# Patient Record
Sex: Female | Born: 1945 | Race: White | Hispanic: No | Smoking: Former smoker
Health system: Southern US, Community
[De-identification: ages and names within clinical notes are randomized; demographics above are authoritative.]

## PROBLEM LIST (undated history)

## (undated) DIAGNOSIS — J301 Allergic rhinitis due to pollen: Secondary | ICD-10-CM

## (undated) DIAGNOSIS — N390 Urinary tract infection, site not specified: Secondary | ICD-10-CM

## (undated) DIAGNOSIS — I10 Essential (primary) hypertension: Secondary | ICD-10-CM

## (undated) DIAGNOSIS — B019 Varicella without complication: Secondary | ICD-10-CM

## (undated) DIAGNOSIS — E785 Hyperlipidemia, unspecified: Secondary | ICD-10-CM

## (undated) HISTORY — DX: Varicella without complication: B01.9

## (undated) HISTORY — DX: Essential (primary) hypertension: I10

## (undated) HISTORY — DX: Urinary tract infection, site not specified: N39.0

## (undated) HISTORY — DX: Hyperlipidemia, unspecified: E78.5

## (undated) HISTORY — DX: Allergic rhinitis due to pollen: J30.1

---

## 2017-11-13 ENCOUNTER — Encounter: Payer: Self-pay | Admitting: Family Medicine

## 2017-12-10 ENCOUNTER — Ambulatory Visit: Payer: Self-pay

## 2017-12-10 ENCOUNTER — Encounter (INDEPENDENT_AMBULATORY_CARE_PROVIDER_SITE_OTHER): Payer: Self-pay

## 2017-12-10 ENCOUNTER — Encounter: Payer: Self-pay | Admitting: Family Medicine

## 2017-12-10 ENCOUNTER — Ambulatory Visit (INDEPENDENT_AMBULATORY_CARE_PROVIDER_SITE_OTHER): Payer: Medicare Other | Admitting: Family Medicine

## 2017-12-10 VITALS — BP 160/60 | HR 104 | Wt 118.0 lb

## 2017-12-10 DIAGNOSIS — G8929 Other chronic pain: Secondary | ICD-10-CM | POA: Diagnosis not present

## 2017-12-10 DIAGNOSIS — Z23 Encounter for immunization: Secondary | ICD-10-CM

## 2017-12-10 DIAGNOSIS — M25562 Pain in left knee: Secondary | ICD-10-CM

## 2017-12-10 DIAGNOSIS — M25862 Other specified joint disorders, left knee: Secondary | ICD-10-CM | POA: Diagnosis not present

## 2017-12-10 DIAGNOSIS — R2242 Localized swelling, mass and lump, left lower limb: Secondary | ICD-10-CM | POA: Insufficient documentation

## 2017-12-10 MED ORDER — VITAMIN D (ERGOCALCIFEROL) 1.25 MG (50000 UNIT) PO CAPS: 50000.0000 [IU] | ORAL_CAPSULE | ORAL | 0 refills | Status: DC

## 2017-12-10 NOTE — Assessment & Plan Note (Signed)
Left knee mass.  Looks to be more of a ganglion cyst.  Attempted to aspirate aspiration.  I do not see any true abnormal vascularity.  Patient has no fevers chills or any abnormal weight loss.  I do believe though that this does need further evaluation.  This is a very large mass that does cross midline.  Likely again a ganglion cyst within the Baker's cyst.  We discussed icing regimen.  Patient will try compression.  Will be following up with me again in 3 weeks.  X-rays are pending as well.  Patient denies fevers or chills or any worsening symptoms to seek medical attention immediately.  Strong family history of ganglion cyst

## 2017-12-10 NOTE — Progress Notes (Signed)
[facility] [address] [place], [place] [address] Phone: [phone] Subjective:    I [name] am serving as a Neurosurgeon for Dr. Antoine Primas.   I'm seeing this patient by the request  of:    CC: Left knee pain  BJY:NWGNFAOZHY  Lori Allison is a 72 y.o. female coming in with complaint of left knee pain. History of swelling in the knee. Pain comes and goes. Has been wearing a brace. Pain recently got worse in August. States that her knee has always been larger than the other knee. TTP in certain places. States there is a knot in the back of her knee. Believes there is a bakers cyst. No numbness and tingling noted. Compression makes the pain worse. Uses hinge brace.   Onset- Chronic (May) Location-  Duration-  Character- sharp. Tightness Aggravating factors- stairs, ADL, flexion, internal rotation of the hip Reliving factors-  Therapies tried- Brace, [facility] out of 10     Past Medical History:  Diagnosis Date  . Chicken pox   . Hay fever   . UTI (urinary tract infection)    History reviewed. No pertinent surgical history. Social History   Socioeconomic History  . Marital status: Divorced    Spouse name: Not on file  . Number of children: Not on file  . Years of education: Not on file  . Highest education level: Not on file  Occupational History  . Not on file  Social Needs  . Financial resource strain: Not on file  . Food insecurity:    Worry: Not on file    Inability: Not on file  . Transportation needs:    Medical: Not on file    Non-medical: Not on file  Tobacco Use  . Smoking status: Former Games developer  . Smokeless tobacco: Former Engineer, water and Sexual Activity  . Alcohol use: Not on file  . Drug use: Not on file  . Sexual activity: Not on file  Lifestyle  . Physical activity:    Days per week: Not on file    Minutes per session: Not on file  . Stress: Not on file  Relationships  . Social connections:   Talks on phone: Not on file    Gets together: Not on file    Attends religious service: Not on file    Active member of club or organization: Not on file    Attends meetings of clubs or organizations: Not on file    Relationship status: Not on file  Other Topics Concern  . Not on file  Social History Narrative  . Not on file   Not on File Family History  Problem Relation Age of Onset  . High blood pressure Mother   . Heart disease Father   . High blood pressure Sister   . Early death Brother      Current Outpatient Medications (Cardiovascular):  .  omega-3 acid ethyl esters (LOVAZA) 1 g capsule, Take by mouth daily.   Current Outpatient Medications (Analgesics):  .  Naproxen Sodium (ALEVE PO), Take 200 mg by mouth. 3 daily   Current Outpatient Medications (Other):  .  calcium-vitamin D 250-100 MG-UNIT tablet, Take 1 tablet by mouth 2 (two) times daily. 630 mg twice a day .  Multiple Vitamin (MULTIVITAMIN) tablet, Take 1 tablet by mouth daily. .  potassium citrate (UROCIT-K) 10 MEQ (1080 MG) SR tablet, Take by mouth 3 (three) times daily with meals. 99 mgs daily .  Vitamin D, Ergocalciferol, (DRISDOL) 50000 units CAPS capsule, Take 1 capsule (50,000 Units total) by mouth every 7 (seven) days.    Past medical history, social, surgical and family history all reviewed in electronic medical record.  No pertanent information unless stated regarding to the chief complaint.   Review of Systems:  No headache, visual changes, nausea, vomiting, diarrhea, constipation, dizziness, abdominal pain, skin rash, fevers, chills, night sweats, weight loss, swollen lymph nodes, body aches, joint swelling, muscle aches, chest pain, shortness of breath, mood changes.   Objective  Blood pressure (!) 160/60, pulse (!) 104, weight 118 lb (53.5 kg), SpO2 98 %.   General: No apparent distress alert and oriented x3 mood and affect normal, dressed appropriately.  HEENT: Pupils equal, extraocular  movements intact  Respiratory: Patient's speak in full sentences and does not appear short of breath  Cardiovascular: No lower extremity edema, non tender, no erythema  Skin: Warm dry intact with no signs of infection or rash on extremities or on axial skeleton.  Abdomen: Soft nontender  Neuro: Cranial nerves II through XII are intact, neurovascularly intact in all extremities with 2+ DTRs and 2+ pulses.  Lymph: No lymphadenopathy of posterior or anterior cervical chain or axillae bilaterally.  Gait is antalgic MSK:  Non tender with full range of motion and good stability and symmetric strength and tone of shoulders, elbows, wrist, hip, and ankles bilaterally.  Knee: Left valgus deformity noted.  Normal thigh to calf ratio.  Tender to palpation over medial and PF joint line.  Limited range of motion lacking last 15 degrees of flexion.  There is some fullness of the popliteal space instability with valgus force.  painful patellar compression. Patellar glide with moderate crepitus. Patellar and quadriceps tendons unremarkable. Hamstring and quadriceps strength is normal. Contralateral knee shows mild arthritic changes  MSK US performed of: Left knee This study was ordered, performed, and interpreted by Terrilee Files D.O.  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  Patient does have what appears to be a synovitis.  In also a Baker's cyst.  Baker's cyst does have some atypical appearance with debris and calcific changes.  No surrounding vascularity no noted.  Does pass midline though.  Difficult with compression.  Possible ganglion cyst  IMPRESSION: Baker's cyst with moderate arthritis  Procedure: Real-time Ultrasound Guided Injection of left knee Device: GE Logiq Q7 Ultrasound guided injection is preferred based studies that show increased duration, increased effect, greater accuracy,  decreased procedural pain, increased response rate, and decreased cost with ultrasound guided versus blind injection.  Verbal informed consent obtained.  Time-out conducted.  Noted no overlying erythema, induration, or other signs of local infection.  Skin prepped in a sterile fashion.  Local anesthesia: Topical Ethyl chloride.  With sterile technique and under real time ultrasound guidance: With a 22-gauge 2 inch needle patient was injected with 4 cc of 0.5% Marcaine unable to aspirate and 1 cc of Kenalog 40 mg/dL. This was from a posterior approach.  Completed without difficulty  Pain immediately resolved suggesting accurate placement of the medication.  Advised to call if fevers/chills, erythema, induration, drainage, or persistent bleeding.  Images permanently stored and available for review in the ultrasound unit.  Impression: Technically successful ultrasound guided injection.    Impression and Recommendations:     This case required medical decision making of moderate complexity. The above documentation has been reviewed and is accurate and complete  Lyndal Pulley, DO       Note: This dictation was prepared with Dragon dictation along with smaller phrase technology. Any transcriptional errors that result from this process are unintentional.

## 2017-12-10 NOTE — Patient Instructions (Addendum)
Good to see you  Compression sleeve daily  Drained the knee Xrays downstairs The patient is advised to apply heat intermittently (avoid sleeping on heating pad). Compression to the back of knee 10 minutes 2 times a day  Stop the calcium  Vitamin D once a week for 12 weeks See me again in 3-4 weeks

## 2017-12-31 ENCOUNTER — Ambulatory Visit
Admission: RE | Admit: 2017-12-31 | Discharge: 2017-12-31 | Disposition: A | Discharge status: Home / Self Care | Payer: Medicare Other | Source: Ambulatory Visit | Attending: Family Medicine | Admitting: Family Medicine

## 2017-12-31 DIAGNOSIS — G8929 Other chronic pain: Secondary | ICD-10-CM

## 2017-12-31 DIAGNOSIS — M25562 Pain in left knee: Principal | ICD-10-CM

## 2018-01-01 ENCOUNTER — Encounter: Payer: Self-pay | Admitting: Family Medicine

## 2018-01-06 NOTE — Progress Notes (Signed)
[facility] [address] [place], [place] [address] Phone: [phone] Subjective:    I Lori Allison am serving as a Neurosurgeon for Dr. Antoine Primas.   CC: Knee pain follow-up  BJY:NWGNFAOZHY  Lori Allison is a 72 y.o. female coming in with complaint of left knee pain. States that the knee is not doing well. Fluid was removed from the knee last visit.  Patient presented for length and did not make any significant change.  Sent for an MRI because of the complex cystic structure that was noted in the popliteal area.  MRI was independently visualized by me.  Showed chondromalacia and moderate arthritic changes of the knee.  Patient does have a complex popliteal cyst noted.  No abnormal vascularity though noted.  Patient does have a meniscal tear as well.  At this point patient has failed all conservative therapy.  Having difficulty with even just regular ambulation secondary to the mid back pain.  Sometimes can even be very uncomfortable and going to bed at night.  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     Past Medical History:  Diagnosis Date  . Chicken pox   . Hay fever   . UTI (urinary tract infection)    No past surgical history on file. Social History   Socioeconomic History  . Marital status: Divorced    Spouse name: Not on file  . Number of children: Not on file  . Years of education: Not on file  . Highest education level: Not on file  Occupational History  . Not on file  Social Needs  . Financial resource strain: Not on file  . Food insecurity:    Worry: Not on file    Inability: Not on file  . Transportation needs:    Medical: Not on file    Non-medical: Not on file  Tobacco Use  . Smoking status: Former Games developer  . Smokeless tobacco: Former Engineer, water and Sexual Activity  . Alcohol use:  Not on file  . Drug use: Not on file  . Sexual activity: Not on file  Lifestyle  . Physical activity:    Days per week: Not on file    Minutes per session: Not on file  . Stress: Not on file  Relationships  . Social connections:    Talks on phone: Not on file    Gets together: Not on file    Attends religious service: Not on file    Active member of club or organization: Not on file    Attends meetings of clubs or organizations: Not on file    Relationship status: Not on file  Other Topics Concern  . Not on file  Social History Narrative  . Not on file   Not on File Family History  Problem Relation Age of Onset  . High blood pressure Mother   . Heart disease Father   . High blood pressure Sister   . Early death Brother      Current Outpatient Medications (Cardiovascular):  .  omega-3 acid ethyl esters (LOVAZA) 1 g capsule, Take by mouth daily.   Current Outpatient Medications (Analgesics):  .  Naproxen Sodium (ALEVE PO), Take 200 mg by mouth. 3 daily .  meloxicam (MOBIC) 7.5 MG tablet, Take 1 tablet (7.5 mg total) by mouth daily.   Current Outpatient Medications (Other):  .  calcium-vitamin D 250-100 MG-UNIT tablet, Take 1 tablet by mouth 2 (two) times daily. 630 mg twice a day .  Multiple Vitamin (MULTIVITAMIN) tablet, Take 1 tablet by mouth daily. .  potassium citrate (UROCIT-K) 10 MEQ (1080 MG) SR tablet, Take by mouth 3 (three) times daily with meals. 99 mgs daily .  Vitamin D, Ergocalciferol, (DRISDOL) 50000 units CAPS capsule, Take 1 capsule (50,000 Units total) by mouth every 7 (seven) days.    Past medical history, social, surgical and family history all reviewed in electronic medical record.  No pertanent information unless stated regarding to the chief complaint.   Review of Systems:  No headache, visual changes, nausea, vomiting, diarrhea, constipation, dizziness, abdominal pain, skin rash, fevers, chills, night sweats, weight loss, swollen lymph nodes,  body aches, joint swelling, muscle aches, chest pain, shortness of breath, mood changes.   Objective  Blood pressure (!) 164/70, pulse 93, weight 118 lb (53.5 kg), SpO2 98 %.   General: No apparent distress alert and oriented x3 mood and affect normal, dressed appropriately.  HEENT: Pupils equal, extraocular movements intact  Respiratory: Patient's speak in full sentences and does not appear short of breath  Cardiovascular: No lower extremity edema, non tender, no erythema  Skin: Warm dry intact with no signs of infection or rash on extremities or on axial skeleton.  Abdomen: Soft nontender  Neuro: Cranial nerves II through XII are intact, neurovascularly intact in all extremities with 2+ DTRs and 2+ pulses.  Lymph: No lymphadenopathy of posterior or anterior cervical chain or axillae bilaterally.  Gait normal with good balance and coordination.  MSK:  Non tender with full range of motion and good stability and symmetric strength and tone of shoulders, elbows, wrist, hip, knee and ankles bilaterally.     Impression and Recommendations:     This case required medical decision making of moderate complexity. The above documentation has been reviewed and is accurate and complete Lori Saa, DO       Note: This dictation was prepared with Dragon dictation along with smaller phrase technology. Any transcriptional errors that result from this process are unintentional.

## 2018-01-07 ENCOUNTER — Encounter: Payer: Self-pay | Admitting: Family Medicine

## 2018-01-07 ENCOUNTER — Ambulatory Visit (INDEPENDENT_AMBULATORY_CARE_PROVIDER_SITE_OTHER): Payer: Medicare Other | Admitting: Family Medicine

## 2018-01-07 VITALS — BP 164/70 | HR 93 | Wt 118.0 lb

## 2018-01-07 DIAGNOSIS — R2242 Localized swelling, mass and lump, left lower limb: Secondary | ICD-10-CM | POA: Diagnosis not present

## 2018-01-07 DIAGNOSIS — M25569 Pain in unspecified knee: Secondary | ICD-10-CM

## 2018-01-07 DIAGNOSIS — G8929 Other chronic pain: Secondary | ICD-10-CM

## 2018-01-07 MED ORDER — MELOXICAM 7.5 MG PO TABS: 7.5000 mg | ORAL_TABLET | Freq: Every day | ORAL | 0 refills | Status: DC

## 2018-01-07 NOTE — Patient Instructions (Signed)
Good to see you  Ice is your friend Try the brace daily  Dr. Jerl Santos At [facility] ortho can help  They should call you  Meloxicam daily but if stomach hurts then stop.  Do not take with aleve I am here if you have question

## 2018-01-07 NOTE — Assessment & Plan Note (Addendum)
Patient does have a popliteal mass.  Still likely ganglion but difficult to assess.  Do feel that surgical intervention is necessary at this time.  Patient also has arthritic changes as well.  At this moment I would like patient to be referred to orthopedic specialist to discuss different treatment options.  Patient is in agreement with the plan.  Patient is hoping that a possible arthroscopic procedure could be a possibility.  Discussed with her that she should discuss with the orthopedic surgeon for further evaluation. Spent  25 minutes with patient face-to-face and had greater than 50% of counseling including as described above in assessment and plan.

## 2018-01-29 ENCOUNTER — Encounter: Payer: Self-pay | Admitting: Family Medicine

## 2018-01-29 ENCOUNTER — Ambulatory Visit (INDEPENDENT_AMBULATORY_CARE_PROVIDER_SITE_OTHER): Payer: Medicare Other

## 2018-01-29 ENCOUNTER — Ambulatory Visit (INDEPENDENT_AMBULATORY_CARE_PROVIDER_SITE_OTHER): Payer: Medicare Other | Admitting: Family Medicine

## 2018-01-29 VITALS — BP 160/78 | HR 101 | Ht 63.5 in | Wt 118.0 lb

## 2018-01-29 DIAGNOSIS — I10 Essential (primary) hypertension: Secondary | ICD-10-CM

## 2018-01-29 DIAGNOSIS — R0602 Shortness of breath: Secondary | ICD-10-CM

## 2018-01-29 DIAGNOSIS — E7800 Pure hypercholesterolemia, unspecified: Secondary | ICD-10-CM

## 2018-01-29 DIAGNOSIS — Z0001 Encounter for general adult medical examination with abnormal findings: Secondary | ICD-10-CM

## 2018-01-29 DIAGNOSIS — R0989 Other specified symptoms and signs involving the circulatory and respiratory systems: Secondary | ICD-10-CM | POA: Diagnosis not present

## 2018-01-29 DIAGNOSIS — E559 Vitamin D deficiency, unspecified: Secondary | ICD-10-CM

## 2018-01-29 DIAGNOSIS — Z0000 Encounter for general adult medical examination without abnormal findings: Secondary | ICD-10-CM | POA: Insufficient documentation

## 2018-01-29 MED ORDER — LISINOPRIL 20 MG PO TABS: 20.0000 mg | ORAL_TABLET | Freq: Every day | ORAL | 0 refills | Status: DC

## 2018-01-29 NOTE — Progress Notes (Addendum)
Subjective:  Patient ID: Lori Allison, female    DOB: [birth year]  Age: 72 y.o. MRN: [id number]  CC: Establish Care   HPI Lori Allison presents for evaluation of her elevated blood pressure.  Patient tells me it has been mildly elevated over the years.  Chart review shows it to be elevated over the last couple of months at least.  Patient admits to a lot of recent stress in her life.  She stays busy helping her 29 year old mother who is still living independently.  Patient has an older daughter living with her along with her significant other.  Patient used to enjoy one beer a night and has since discontinued that since her blood pressure has been elevated.  She had been taking Aleve but is also stopped that.  She is cut back on her sodium intake but admits to enjoying processed foods.  She leads an active lifestyle.  She works out in her yard and keeps up her home.  She quit smoking 12 years ago.  She does not use illicit drugs.  She retired from [employer] in 2007.  Patient does have shortness of breath at times that she feels is stress related.  She denies dyspnea on exertion chest pain nausea vomiting or diaphoresis.  She is dealing with significant left knee pain and is been told that she needs of left-sided knee replacement.  She also needs cataract surgery.  Outpatient Medications Prior to Visit  Medication Sig Dispense Refill  . Multiple Vitamin (MULTIVITAMIN) tablet Take 1 tablet by mouth daily.    Marland Kitchen omega-3 acid ethyl esters (LOVAZA) 1 g capsule Take by mouth daily.    . Vitamin D, Ergocalciferol, (DRISDOL) 50000 units CAPS capsule Take 1 capsule (50,000 Units total) by mouth every 7 (seven) days. 12 capsule 0  . Naproxen Sodium (ALEVE PO) Take 200 mg by mouth. 3 daily    . potassium citrate (UROCIT-K) 10 MEQ (1080 MG) SR tablet Take by mouth 3 (three) times daily with meals. 99 mgs daily    . calcium-vitamin D 250-100 MG-UNIT tablet Take 1 tablet by mouth 2 (two)  times daily. 630 mg twice a day    . meloxicam (MOBIC) 7.5 MG tablet Take 1 tablet (7.5 mg total) by mouth daily. 30 tablet 0   No facility-administered medications prior to visit.   Motivated  ROS Review of Systems  Constitutional: Negative for chills, diaphoresis, fatigue, fever and unexpected weight change.  HENT: Negative.   Eyes: Negative for photophobia and visual disturbance.  Respiratory: Positive for shortness of breath. Negative for chest tightness and wheezing.   Cardiovascular: Negative for chest pain, palpitations and leg swelling.  Gastrointestinal: Negative.   Endocrine: Negative for polyphagia and polyuria.  Genitourinary: Negative.   Musculoskeletal: Positive for arthralgias.  Skin: Negative for pallor and rash.  Allergic/Immunologic: Negative for immunocompromised state.  Neurological: Negative for seizures, speech difficulty, weakness and headaches.  Hematological: Does not bruise/bleed easily.  Psychiatric/Behavioral: Negative.     Objective:  BP (!) 160/78 (BP Location: Left Arm, Patient Position: Sitting, Cuff Size: Normal)   Pulse (!) 101   Ht 5' 3.5" (1.613 m)   Wt 118 lb (53.5 kg)   SpO2 99%   BMI 20.57 kg/m   BP Readings from Last 3 Encounters:  02/10/18 138/70  01/29/18 (!) 160/78  01/07/18 (!) 164/70    Wt Readings from Last 3 Encounters:  01/29/18 118 lb (53.5 kg)  01/07/18 118 lb (53.5 kg)  12/10/17 118 lb (53.5 kg)    Physical Exam  Constitutional: She is oriented to person, place, and time. She appears well-nourished. No distress.  HENT:  Head: Normocephalic and atraumatic.  Right Ear: External ear normal.  Left Ear: External ear normal.  Mouth/Throat: Oropharynx is clear and moist. No oropharyngeal exudate.  Eyes: Pupils are equal, round, and reactive to light. Conjunctivae and EOM are normal. Right eye exhibits no discharge. Left eye exhibits no discharge. No scleral icterus.  Neck: Neck supple. No JVD present. No tracheal  deviation present. No thyromegaly present.  Cardiovascular: Normal rate, regular rhythm and normal heart sounds.  Pulses:      Carotid pulses are 2+ on the right side with bruit, and 2+ on the left side. Pulmonary/Chest: Effort normal and breath sounds normal.  Abdominal: Soft. Bowel sounds are normal. She exhibits no distension and no mass. There is no tenderness. There is no guarding.  Neurological: She is alert and oriented to person, place, and time.  Skin: Skin is warm and dry. She is not diaphoretic.  Psychiatric: She has a normal mood and affect. Her behavior is normal.    Lab Results  Component Value Date   WBC 8.4 02/10/2018   HGB 14.4 02/10/2018   HCT 43.2 02/10/2018   PLT 391.0 02/10/2018   GLUCOSE 115 (H) 02/10/2018   CHOL 245 (H) 02/10/2018   TRIG 98.0 02/10/2018   HDL 70.90 02/10/2018   LDLDIRECT 155.0 02/10/2018   LDLCALC 155 (H) 02/10/2018   ALT 20 02/10/2018   AST 19 02/10/2018   NA 139 02/10/2018   K 5.0 02/10/2018   CL 104 02/10/2018   CREATININE 0.63 02/10/2018   BUN 16 02/10/2018   CO2 26 02/10/2018    Mr Knee Left  Wo Contrast  Result Date: 12/31/2017 CLINICAL DATA:  Left knee pain and swelling, more anterior near the patella with difficulty climbing steps. Patient fell December, 2018. EXAM: MRI OF THE LEFT KNEE WITHOUT CONTRAST TECHNIQUE: Multiplanar, multisequence MR imaging of the knee was performed. No intravenous contrast was administered. COMPARISON:  None. FINDINGS: MENISCI Medial meniscus: Macerated appearance of the body of the medial meniscus with diminutive posterior horn. Lateral meniscus:  Intact LIGAMENTS Cruciates: Partial tear of the ACL near its femoral attachment, series 5/9 and 10. Intact PCL. Collaterals: Medial collateral ligament is intact. Lateral collateral ligament complex is intact. CARTILAGE Patellofemoral: Irregular chondral thinning of the lateral patellar cartilage with subchondral edema and cystic change deep to the median ridge  and lateral patellar facet. Findings likely represent areas of full-thickness cartilage loss. No focal chondral defect of the trochlear cartilage. Medial: Chondromalacia of the medial femorotibial compartment with subchondral cystic change of the femoral condyle and edema, most pronounced posteriorly, series 5/11. Lateral: Moderate irregular chondral thinning of the lateral femorotibial compartment with subchondral degenerative cystic change and edema of the lateral femoral condyle. Joint: No joint effusion or plical thickening. Intact Hoffa's fat pad. Popliteal Fossa: Complex popliteal cyst with internal debris noted measuring up to 2.8 x 0.8 cm on coronal series 5/5. Muscle strain of the popliteus. Extensor Mechanism:  Intact Bones:  No acute fracture or joint dislocation. Other: None IMPRESSION: 1. Macerated appearance of the body of the medial meniscus degeneration of the posterior horn given its diminutive appearance. 2. 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 3. Complex popliteal cyst. 4. Muscle strain of the popliteus. 5. Partial tear of the ACL suspected near its femoral attachment  given hyperintense signal/edema in the expected location of its attachment. Electronically Signed   By: Tollie Eth M.D.   On: 12/31/2017 17:32    Assessment & Plan:   Lori Allison was seen today for establish care.  Diagnoses and all orders for this visit:  Essential hypertension -     EKG 12-Lead -     DG Chest 2 View; Future -     CBC; Future -     Comprehensive metabolic panel; Future -     Urinalysis, Routine w reflex microscopic; Future -     lisinopril (PRINIVIL,ZESTRIL) 20 MG tablet; Take 1 tablet (20 mg total) by mouth daily. -     DG Chest 2 View  Encounter for health maintenance examination with abnormal findings  Bruit of right carotid artery -     VAS US CAROTID; Future -     LDL cholesterol, direct; Future -      Lipid panel; Future -     atorvastatin (LIPITOR) 20 MG tablet; Take 1 tablet (20 mg total) by mouth daily.  SOB (shortness of breath) -     DG Chest 2 View; Future -     DG Chest 2 View  Vitamin D deficiency -     VITAMIN D 25 Hydroxy (Vit-D Deficiency, Fractures); Future  Elevated LDL cholesterol level -     atorvastatin (LIPITOR) 20 MG tablet; Take 1 tablet (20 mg total) by mouth daily.   I have discontinued Lori Allison calcium-vitamin D and meloxicam. I am also having her start on lisinopril and atorvastatin. Additionally, I am having her maintain her multivitamin, omega-3 acid ethyl esters, and Vitamin D (Ergocalciferol).  Meds ordered this encounter  Medications  . lisinopril (PRINIVIL,ZESTRIL) 20 MG tablet    Sig: Take 1 tablet (20 mg total) by mouth daily.    Dispense:  90 tablet    Refill:  0  . atorvastatin (LIPITOR) 20 MG tablet    Sig: Take 1 tablet (20 mg total) by mouth daily.    Dispense:  90 tablet    Refill:  1   Will go ahead and start the patient on lisinopril 20 mg daily.  She will return fasting for her above ordered blood work.  She was given anticipatory guidance on hypertension the DASH diet and lisinopril.  Asked her to look out for cough.  She will follow-up with me in 12 days.  Hopefully this will be enough time for her to have her carotid Dopplers done.  Person would be at high risk for peripheral vascular disease.  Anticipate recommending aspirin therapy pending results of above ordered work-up.  Follow-up: Return in about 12 days (around 02/10/2018).  Mliss Sax, MD

## 2018-01-29 NOTE — Patient Instructions (Signed)
DASH Eating Plan DASH stands for "Dietary Approaches to Stop Hypertension." The DASH eating plan is a healthy eating plan that has been shown to reduce high blood pressure (hypertension). It may also reduce your risk for type 2 diabetes, heart disease, and stroke. The DASH eating plan may also help with weight loss. What are tips for following this plan? General guidelines  Avoid eating more than 2,300 mg (milligrams) of salt (sodium) a day. If you have hypertension, you may need to reduce your sodium intake to 1,500 mg a day.  Limit alcohol intake to no more than 1 drink a day for nonpregnant women and 2 drinks a day for men. One drink equals 12 oz of beer, 5 oz of wine, or 1 oz of hard liquor.  Work with your health care provider to maintain a healthy body weight or to lose weight. Ask what an ideal weight is for you.  Get at least 30 minutes of exercise that causes your heart to beat faster (aerobic exercise) most days of the week. Activities may include walking, swimming, or biking.  Work with your health care provider or diet and nutrition specialist (dietitian) to adjust your eating plan to your individual calorie needs. Reading food labels  Check food labels for the amount of sodium per serving. Choose foods with less than 5 percent of the Daily Value of sodium. Generally, foods with less than 300 mg of sodium per serving fit into this eating plan.  To find whole grains, look for the word "whole" as the first word in the ingredient list. Shopping  Buy products labeled as "low-sodium" or "no salt added."  Buy fresh foods. Avoid canned foods and premade or frozen meals. Cooking  Avoid adding salt when cooking. Use salt-free seasonings or herbs instead of table salt or sea salt. Check with your health care provider or pharmacist before using salt substitutes.  Do not fry foods. Cook foods using healthy methods such as baking, boiling, grilling, and broiling instead.  Cook with  heart-healthy oils, such as olive, canola, soybean, or sunflower oil. Meal planning   Eat a balanced diet that includes: ? 5 or more servings of fruits and vegetables each day. At each meal, try to fill half of your plate with fruits and vegetables. ? Up to 6-8 servings of whole grains each day. ? Less than 6 oz of lean meat, poultry, or fish each day. A 3-oz serving of meat is about the same size as a deck of cards. One egg equals 1 oz. ? 2 servings of low-fat dairy each day. ? A serving of nuts, seeds, or beans 5 times each week. ? Heart-healthy fats. Healthy fats called Omega-3 fatty acids are found in foods such as flaxseeds and coldwater fish, like sardines, salmon, and mackerel.  Limit how much you eat of the following: ? Canned or prepackaged foods. ? Food that is high in trans fat, such as fried foods. ? Food that is high in saturated fat, such as fatty meat. ? Sweets, desserts, sugary drinks, and other foods with added sugar. ? Full-fat dairy products.  Do not salt foods before eating.  Try to eat at least 2 vegetarian meals each week.  Eat more home-cooked food and less restaurant, buffet, and fast food.  When eating at a restaurant, ask that your food be prepared with less salt or no salt, if possible. What foods are recommended? The items listed may not be a complete list. Talk with your dietitian about what   dietary choices are best for you. Grains Whole-grain or whole-wheat bread. Whole-grain or whole-wheat pasta. Brown rice. Oatmeal. Quinoa. Bulgur. Whole-grain and low-sodium cereals. Pita bread. Low-fat, low-sodium crackers. Whole-wheat flour tortillas. Vegetables Fresh or frozen vegetables (raw, steamed, roasted, or grilled). Low-sodium or reduced-sodium tomato and vegetable juice. Low-sodium or reduced-sodium tomato sauce and tomato paste. Low-sodium or reduced-sodium canned vegetables. Fruits All fresh, dried, or frozen fruit. Canned fruit in natural juice (without  added sugar). Meat and other protein foods Skinless chicken or turkey. Ground chicken or turkey. Pork with fat trimmed off. Fish and seafood. Egg whites. Dried beans, peas, or lentils. Unsalted nuts, nut butters, and seeds. Unsalted canned beans. Lean cuts of beef with fat trimmed off. Low-sodium, lean deli meat. Dairy Low-fat (1%) or fat-free (skim) milk. Fat-free, low-fat, or reduced-fat cheeses. Nonfat, low-sodium ricotta or cottage cheese. Low-fat or nonfat yogurt. Low-fat, low-sodium cheese. Fats and oils Soft margarine without trans fats. Vegetable oil. Low-fat, reduced-fat, or light mayonnaise and salad dressings (reduced-sodium). Canola, safflower, olive, soybean, and sunflower oils. Avocado. Seasoning and other foods Herbs. Spices. Seasoning mixes without salt. Unsalted popcorn and pretzels. Fat-free sweets. What foods are not recommended? The items listed may not be a complete list. Talk with your dietitian about what dietary choices are best for you. Grains Baked goods made with fat, such as croissants, muffins, or some breads. Dry pasta or rice meal packs. Vegetables Creamed or fried vegetables. Vegetables in a cheese sauce. Regular canned vegetables (not low-sodium or reduced-sodium). Regular canned tomato sauce and paste (not low-sodium or reduced-sodium). Regular tomato and vegetable juice (not low-sodium or reduced-sodium). Pickles. Olives. Fruits Canned fruit in a light or heavy syrup. Fried fruit. Fruit in cream or butter sauce. Meat and other protein foods Fatty cuts of meat. Ribs. Fried meat. Bacon. Sausage. Bologna and other processed lunch meats. Salami. Fatback. Hotdogs. Bratwurst. Salted nuts and seeds. Canned beans with added salt. Canned or smoked fish. Whole eggs or egg yolks. Chicken or turkey with skin. Dairy Whole or 2% milk, cream, and half-and-half. Whole or full-fat cream cheese. Whole-fat or sweetened yogurt. Full-fat cheese. Nondairy creamers. Whipped toppings.  Processed cheese and cheese spreads. Fats and oils Butter. Stick margarine. Lard. Shortening. Ghee. Bacon fat. Tropical oils, such as coconut, palm kernel, or palm oil. Seasoning and other foods Salted popcorn and pretzels. Onion salt, garlic salt, seasoned salt, table salt, and sea salt. Worcestershire sauce. Tartar sauce. Barbecue sauce. Teriyaki sauce. Soy sauce, including reduced-sodium. Steak sauce. Canned and packaged gravies. Fish sauce. Oyster sauce. Cocktail sauce. Horseradish that you find on the shelf. Ketchup. Mustard. Meat flavorings and tenderizers. Bouillon cubes. Hot sauce and Tabasco sauce. Premade or packaged marinades. Premade or packaged taco seasonings. Relishes. Regular salad dressings. Where to find more information:  National Heart, Lung, and Blood Institute: www.nhlbi.nih.gov  American Heart Association: www.heart.org Summary  The DASH eating plan is a healthy eating plan that has been shown to reduce high blood pressure (hypertension). It may also reduce your risk for type 2 diabetes, heart disease, and stroke.  With the DASH eating plan, you should limit salt (sodium) intake to 2,300 mg a day. If you have hypertension, you may need to reduce your sodium intake to 1,500 mg a day.  When on the DASH eating plan, aim to eat more fresh fruits and vegetables, whole grains, lean proteins, low-fat dairy, and heart-healthy fats.  Work with your health care provider or diet and nutrition specialist (dietitian) to adjust your eating plan to your individual   calorie needs. This information is not intended to replace advice given to you by your health care provider. Make sure you discuss any questions you have with your health care provider. Document Released: 02/21/2011 Document Revised: 02/26/2016 Document Reviewed: 02/26/2016 Elsevier Interactive Patient Education  2018 [employer] Lisinopril tablets What is this medicine? LISINOPRIL (lyse [place] oh pril) is an ACE inhibitor.  This medicine is used to treat high blood pressure and heart failure. It is also used to protect the heart immediately after a heart attack. This medicine may be used for other purposes; ask your health care provider or pharmacist if you have questions. COMMON BRAND NAME(S): Prinivil, Zestril What should I tell my health care provider before I take this medicine? They need to know if you have any of these conditions: -diabetes -heart or blood vessel disease -kidney disease -low blood pressure -previous swelling of the tongue, face, or lips with difficulty breathing, difficulty swallowing, hoarseness, or tightening of the throat -an unusual or allergic reaction to lisinopril, other ACE inhibitors, insect venom, foods, dyes, or preservatives -pregnant or trying to get pregnant -breast-feeding How should I use this medicine? Take this medicine by mouth with a glass of water. Follow the directions on your prescription label. You may take this medicine with or without food. If it upsets your stomach, take it with food. Take your medicine at regular intervals. Do not take it more often than directed. Do not stop taking except on your doctor's advice. Talk to your pediatrician regarding the use of this medicine in children. Special care may be needed. While this drug may be prescribed for children as young as 6 years of age for selected conditions, precautions do apply. Overdosage: If you think you have taken too much of this medicine contact a poison control center or emergency room at once. NOTE: This medicine is only for you. Do not share this medicine with others. What if I miss a dose? If you miss a dose, take it as soon as you can. If it is almost time for your next dose, take only that dose. Do not take double or extra doses. What may interact with this medicine? Do not take this medicine with any of the following medications: -hymenoptera venom -sacubitril; valsartan This medicines may also  interact with the following medications: -aliskiren -angiotensin receptor blockers, like losartan or valsartan -certain medicines for diabetes -diuretics -everolimus -gold compounds -lithium -NSAIDs, medicines for pain and inflammation, like ibuprofen or naproxen -potassium salts or supplements -salt substitutes -sirolimus -temsirolimus This list may not describe all possible interactions. Give your health care provider a list of all the medicines, herbs, non-prescription drugs, or dietary supplements you use. Also tell them if you smoke, drink alcohol, or use illegal drugs. Some items may interact with your medicine. What should I watch for while using this medicine? Visit your doctor or health care professional for regular check ups. Check your blood pressure as directed. Ask your doctor what your blood pressure should be, and when you should contact him or her. Do not treat yourself for coughs, colds, or pain while you are using this medicine without asking your doctor or health care professional for advice. Some ingredients may increase your blood pressure. Women should inform their doctor if they wish to become pregnant or think they might be pregnant. There is a potential for serious side effects to an unborn child. Talk to your health care professional or pharmacist for more information. Check with your doctor or health care   professional if you get an attack of severe diarrhea, nausea and vomiting, or if you sweat a lot. The loss of too much body fluid can make it dangerous for you to take this medicine. You may get drowsy or dizzy. Do not drive, use machinery, or do anything that needs mental alertness until you know how this drug affects you. Do not stand or sit up quickly, especially if you are an older patient. This reduces the risk of dizzy or fainting spells. Alcohol can make you more drowsy and dizzy. Avoid alcoholic drinks. Avoid salt substitutes unless you are told otherwise by  your doctor or health care professional. What side effects may I notice from receiving this medicine? Side effects that you should report to your doctor or health care professional as soon as possible: -allergic reactions like skin rash, itching or hives, swelling of the hands, feet, face, lips, throat, or tongue -breathing problems -signs and symptoms of kidney injury like trouble passing urine or change in the amount of urine -signs and symptoms of increased potassium like muscle weakness; chest pain; or fast, irregular heartbeat -signs and symptoms of liver injury like dark yellow or brown urine; general ill feeling or flu-like symptoms; light-colored stools; loss of appetite; nausea; right upper belly pain; unusually weak or tired; yellowing of the eyes or skin -signs and symptoms of low blood pressure like dizziness; feeling faint or lightheaded, falls; unusually weak or tired -stomach pain with or without nausea and vomiting Side effects that usually do not require medical attention (report to your doctor or health care professional if they continue or are bothersome): -changes in taste -cough -dizziness -fever -headache -sensitivity to light This list may not describe all possible side effects. Call your doctor for medical advice about side effects. You may report side effects to FDA at 1-800-FDA-1088. Where should I keep my medicine? Keep out of the reach of children. Store at room temperature between 15 and 30 degrees C (59 and 86 degrees F). Protect from moisture. Keep container tightly closed. Throw away any unused medicine after the expiration date. NOTE: This sheet is a summary. It may not cover all possible information. If you have questions about this medicine, talk to your doctor, pharmacist, or health care provider.  2018 Elsevier/Gold Standard (2015-04-24 12:52:35)  Hypertension Hypertension, commonly called high blood pressure, is when the force of blood pumping through  the arteries is too strong. The arteries are the blood vessels that carry blood from the heart throughout the body. Hypertension forces the heart to work harder to pump blood and may cause arteries to become narrow or stiff. Having untreated or uncontrolled hypertension can cause heart attacks, strokes, kidney disease, and other problems. A blood pressure reading consists of a higher number over a lower number. Ideally, your blood pressure should be below 120/80. The first ("top") number is called the systolic pressure. It is a measure of the pressure in your arteries as your heart beats. The second ("bottom") number is called the diastolic pressure. It is a measure of the pressure in your arteries as the heart relaxes. What are the causes? The cause of this condition is not known. What increases the risk? Some risk factors for high blood pressure are under your control. Others are not. Factors you can change  Smoking.  Having type 2 diabetes mellitus, high cholesterol, or both.  Not getting enough exercise or physical activity.  Being overweight.  Having too much fat, sugar, calories, or salt (sodium) in your  diet.  Drinking too much alcohol. Factors that are difficult or impossible to change  Having chronic kidney disease.  Having a family history of high blood pressure.  Age. Risk increases with age.  Race. You may be at higher risk if you are African-American.  Gender. Men are at higher risk than women before age 65. After age 32, women are at higher risk than men.  Having obstructive sleep apnea.  Stress. What are the signs or symptoms? Extremely high blood pressure (hypertensive crisis) may cause:  Headache.  Anxiety.  Shortness of breath.  Nosebleed.  Nausea and vomiting.  Severe chest pain.  Jerky movements you cannot control (seizures).  How is this diagnosed? This condition is diagnosed by measuring your blood pressure while you are seated, with your arm  resting on a surface. The cuff of the blood pressure monitor will be placed directly against the skin of your upper arm at the level of your heart. It should be measured at least twice using the same arm. Certain conditions can cause a difference in blood pressure between your right and left arms. Certain factors can cause blood pressure readings to be lower or higher than normal (elevated) for a short period of time:  When your blood pressure is higher when you are in a health care provider's office than when you are at home, this is called white coat hypertension. Most people with this condition do not need medicines.  When your blood pressure is higher at home than when you are in a health care provider's office, this is called masked hypertension. Most people with this condition may need medicines to control blood pressure.  If you have a high blood pressure reading during one visit or you have normal blood pressure with other risk factors:  You may be asked to return on a different day to have your blood pressure checked again.  You may be asked to monitor your blood pressure at home for 1 week or longer.  If you are diagnosed with hypertension, you may have other blood or imaging tests to help your health care provider understand your overall risk for other conditions. How is this treated? This condition is treated by making healthy lifestyle changes, such as eating healthy foods, exercising more, and reducing your alcohol intake. Your health care provider may prescribe medicine if lifestyle changes are not enough to get your blood pressure under control, and if:  Your systolic blood pressure is above 130.  Your diastolic blood pressure is above 80.  Your personal target blood pressure may vary depending on your medical conditions, your age, and other factors. Follow these instructions at home: Eating and drinking  Eat a diet that is high in fiber and potassium, and low in sodium,  added sugar, and fat. An example eating plan is called the DASH (Dietary Approaches to Stop Hypertension) diet. To eat this way: ? Eat plenty of fresh fruits and vegetables. Try to fill half of your plate at each meal with fruits and vegetables. ? Eat whole grains, such as whole wheat pasta, brown rice, or whole grain bread. Fill about one quarter of your plate with whole grains. ? Eat or drink low-fat dairy products, such as skim milk or low-fat yogurt. ? Avoid fatty cuts of meat, processed or cured meats, and poultry with skin. Fill about one quarter of your plate with lean proteins, such as fish, chicken without skin, beans, eggs, and tofu. ? Avoid premade and processed foods. These tend to  be higher in sodium, added sugar, and fat.  Reduce your daily sodium intake. Most people with hypertension should eat less than 1,500 mg of sodium a day.  Limit alcohol intake to no more than 1 drink a day for nonpregnant women and 2 drinks a day for men. One drink equals 12 oz of beer, 5 oz of wine, or 1 oz of hard liquor. Lifestyle  Work with your health care provider to maintain a healthy body weight or to lose weight. Ask what an ideal weight is for you.  Get at least 30 minutes of exercise that causes your heart to beat faster (aerobic exercise) most days of the week. Activities may include walking, swimming, or biking.  Include exercise to strengthen your muscles (resistance exercise), such as pilates or lifting weights, as part of your weekly exercise routine. Try to do these types of exercises for 30 minutes at least 3 days a week.  Do not use any products that contain nicotine or tobacco, such as cigarettes and e-cigarettes. If you need help quitting, ask your health care provider.  Monitor your blood pressure at home as told by your health care provider.  Keep all follow-up visits as told by your health care provider. This is important. Medicines  Take over-the-counter and prescription  medicines only as told by your health care provider. Follow directions carefully. Blood pressure medicines must be taken as prescribed.  Do not skip doses of blood pressure medicine. Doing this puts you at risk for problems and can make the medicine less effective.  Ask your health care provider about side effects or reactions to medicines that you should watch for. Contact a health care provider if:  You think you are having a reaction to a medicine you are taking.  You have headaches that keep coming back (recurring).  You feel dizzy.  You have swelling in your ankles.  You have trouble with your vision. Get help right away if:  You develop a severe headache or confusion.  You have unusual weakness or numbness.  You feel faint.  You have severe pain in your chest or abdomen.  You vomit repeatedly.  You have trouble breathing. Summary  Hypertension is when the force of blood pumping through your arteries is too strong. If this condition is not controlled, it may put you at risk for serious complications.  Your personal target blood pressure may vary depending on your medical conditions, your age, and other factors. For most people, a normal blood pressure is less than 120/80.  Hypertension is treated with lifestyle changes, medicines, or a combination of both. Lifestyle changes include weight loss, eating a healthy, low-sodium diet, exercising more, and limiting alcohol. This information is not intended to replace advice given to you by your health care provider. Make sure you discuss any questions you have with your health care provider. Document Released: 03/04/2005 Document Revised: 01/31/2016 Document Reviewed: 01/31/2016 Elsevier Interactive Patient Education  [employer]2018 [employer]

## 2018-02-10 ENCOUNTER — Encounter: Payer: Self-pay | Admitting: Family Medicine

## 2018-02-10 ENCOUNTER — Ambulatory Visit (INDEPENDENT_AMBULATORY_CARE_PROVIDER_SITE_OTHER): Payer: Medicare Other | Admitting: Family Medicine

## 2018-02-10 VITALS — BP 138/70 | HR 100 | Ht 63.5 in

## 2018-02-10 DIAGNOSIS — I10 Essential (primary) hypertension: Secondary | ICD-10-CM | POA: Diagnosis not present

## 2018-02-10 DIAGNOSIS — R0989 Other specified symptoms and signs involving the circulatory and respiratory systems: Secondary | ICD-10-CM | POA: Diagnosis not present

## 2018-02-10 DIAGNOSIS — E559 Vitamin D deficiency, unspecified: Secondary | ICD-10-CM

## 2018-02-10 DIAGNOSIS — R0982 Postnasal drip: Secondary | ICD-10-CM | POA: Diagnosis not present

## 2018-02-10 LAB — CBC
HEMATOCRIT: 43.2 % (ref 36.0–46.0)
HEMOGLOBIN: 14.4 g/dL (ref 12.0–15.0)
MCHC: 33.4 g/dL (ref 30.0–36.0)
MCV: 88.5 fl (ref 78.0–100.0)
Platelets: 391 10*3/uL (ref 150.0–400.0)
RBC: 4.88 Mil/uL (ref 3.87–5.11)
RDW: 14 % (ref 11.5–15.5)
WBC: 8.4 10*3/uL (ref 4.0–10.5)

## 2018-02-10 LAB — COMPREHENSIVE METABOLIC PANEL
ALT: 20 U/L (ref 0–35)
AST: 19 U/L (ref 0–37)
Albumin: 4.2 g/dL (ref 3.5–5.2)
Alkaline Phosphatase: 133 U/L — ABNORMAL HIGH (ref 39–117)
BUN: 16 mg/dL (ref 6–23)
CHLORIDE: 104 meq/L (ref 96–112)
CO2: 26 meq/L (ref 19–32)
CREATININE: 0.63 mg/dL (ref 0.40–1.20)
Calcium: 9.5 mg/dL (ref 8.4–10.5)
GFR: 98.56 mL/min (ref >60.00)
GLUCOSE: 115 mg/dL — AB (ref 70–99)
POTASSIUM: 5 meq/L (ref 3.5–5.1)
SODIUM: 139 meq/L (ref 135–145)
Total Bilirubin: 0.5 mg/dL (ref 0.2–1.2)
Total Protein: 6.6 g/dL (ref 6.0–8.3)

## 2018-02-10 LAB — URINALYSIS, ROUTINE W REFLEX MICROSCOPIC
BILIRUBIN URINE: NEGATIVE
Hgb urine dipstick: NEGATIVE
KETONES UR: NEGATIVE
LEUKOCYTES UA: NEGATIVE
Nitrite: NEGATIVE
PH: 6 (ref 5.0–8.0)
RBC / HPF: NONE SEEN (ref 0–?)
Specific Gravity, Urine: <=1.005 — AB (ref 1.000–1.030)
Total Protein, Urine: NEGATIVE
URINE GLUCOSE: NEGATIVE
UROBILINOGEN UA: 0.2 (ref 0.0–1.0)

## 2018-02-10 LAB — LDL CHOLESTEROL, DIRECT: LDL DIRECT: 155 mg/dL

## 2018-02-10 LAB — LIPID PANEL
CHOL/HDL RATIO: 3
Cholesterol: 245 mg/dL — ABNORMAL HIGH (ref 0–200)
HDL: 70.9 mg/dL (ref >39.00)
LDL Cholesterol: 155 mg/dL — ABNORMAL HIGH (ref 0–99)
NONHDL: 174.23
Triglycerides: 98 mg/dL (ref 0.0–149.0)
VLDL: 19.6 mg/dL (ref 0.0–40.0)

## 2018-02-10 LAB — VITAMIN D 25 HYDROXY (VIT D DEFICIENCY, FRACTURES): VITD: 70.2 ng/mL (ref 30.00–100.00)

## 2018-02-10 MED ORDER — AZELASTINE HCL 0.1 % NA SOLN: 1.0000 | Freq: Two times a day (BID) | NASAL | 12 refills | Status: DC

## 2018-02-10 NOTE — Progress Notes (Signed)
Established Patient Office Visit  Subjective:  Patient ID: Lori Allison, female    DOB: 08/07/1945  Age: 72 y.o. MRN: [id number]  CC:  Chief Complaint  Patient presents with  . Follow-up    HPI Lori Allison presents for a follow up on her blood pressure. She has been taking her medication as directed and has been checking her blood pressure at home. She has been getting readings in the 120's.  Patient is having no issues taking the lisinopril.  She really has not had much of a cough.  On occasion she feels lightheaded but this quickly passes.  She is no longer taking potassium.  She did see orthopedics who recommended high-dose vitamin D for D deficiency.  She said they told her not to take calcium.  We discussed that they meant not to take Os-Cal with vitamin D.  She will remain on calcium 600 mg twice daily.  She has ongoing postnasal drip with some sneezing.  There is no facial pressure teeth pain fever chills purulent rhinorrhea  Past Medical History:  Diagnosis Date  . Chicken pox   . Hay fever   . UTI (urinary tract infection)     No past surgical history on file.  Family History  Problem Relation Age of Onset  . High blood pressure Mother   . Heart disease Father   . High blood pressure Sister   . Early death Brother     Social History   Socioeconomic History  . Marital status: Divorced    Spouse name: Not on file  . Number of children: Not on file  . Years of education: Not on file  . Highest education level: Not on file  Occupational History  . Not on file  Social Needs  . Financial resource strain: Not on file  . Food insecurity:    Worry: Not on file    Inability: Not on file  . Transportation needs:    Medical: Not on file    Non-medical: Not on file  Tobacco Use  . Smoking status: Former Research scientist (life sciences)  . Smokeless tobacco: Former Network engineer and Sexual Activity  . Alcohol use: Not on file  . Drug use: Not on file  . Sexual activity: Not on file    Lifestyle  . Physical activity:    Days per week: Not on file    Minutes per session: Not on file  . Stress: Not on file  Relationships  . Social connections:    Talks on phone: Not on file    Gets together: Not on file    Attends religious service: Not on file    Active member of club or organization: Not on file    Attends meetings of clubs or organizations: Not on file    Relationship status: Not on file  . Intimate partner violence:    Fear of current or ex partner: Not on file    Emotionally abused: Not on file    Physically abused: Not on file    Forced sexual activity: Not on file  Other Topics Concern  . Not on file  Social History Narrative  . Not on file    Outpatient Medications Prior to Visit  Medication Sig Dispense Refill  . lisinopril (PRINIVIL,ZESTRIL) 20 MG tablet Take 1 tablet (20 mg total) by mouth daily. 90 tablet 0  . Multiple Vitamin (MULTIVITAMIN) tablet Take 1 tablet by mouth daily.    Marland Kitchen omega-3 acid ethyl esters (LOVAZA)  1 g capsule Take by mouth daily.    . Vitamin D, Ergocalciferol, (DRISDOL) 50000 units CAPS capsule Take 1 capsule (50,000 Units total) by mouth every 7 (seven) days. 12 capsule 0  . Naproxen Sodium (ALEVE PO) Take 200 mg by mouth. 3 daily    . potassium citrate (UROCIT-K) 10 MEQ (1080 MG) SR tablet Take by mouth 3 (three) times daily with meals. 99 mgs daily     No facility-administered medications prior to visit.     No Known Allergies  ROS Review of Systems  Constitutional: Negative for diaphoresis, fatigue, fever and unexpected weight change.  HENT: Positive for postnasal drip. Negative for rhinorrhea and sinus pain.   Eyes: Negative for photophobia and visual disturbance.  Respiratory: Negative for cough, chest tightness and wheezing.   Cardiovascular: Negative for chest pain and palpitations.  Gastrointestinal: Negative.   Endocrine: Negative for polyphagia and polyuria.  Genitourinary: Negative.   Musculoskeletal:  Positive for arthralgias and joint swelling.  Skin: Negative.   Allergic/Immunologic: Negative for immunocompromised state.  Neurological: Negative for light-headedness, numbness and headaches.  Hematological: Does not bruise/bleed easily.  Psychiatric/Behavioral: Negative.       Objective:    Physical Exam  Constitutional: She is oriented to person, place, and time. She appears well-developed and well-nourished. No distress.  HENT:  Head: Normocephalic and atraumatic.  Right Ear: External ear normal.  Left Ear: External ear normal.  Eyes: Right eye exhibits no discharge. Left eye exhibits no discharge.  Neck: No JVD present. No tracheal deviation present.  Pulmonary/Chest: Effort normal.  Neurological: She is alert and oriented to person, place, and time.  Skin: Skin is warm and dry. She is not diaphoretic.  Psychiatric: She has a normal mood and affect. Her behavior is normal.    BP 138/70   Pulse 100   Ht 5' 3.5" (1.613 m)   SpO2 100%   BMI 20.57 kg/m  Wt Readings from Last 3 Encounters:  01/29/18 118 lb (53.5 kg)  01/07/18 118 lb (53.5 kg)  12/10/17 118 lb (53.5 kg)   BP Readings from Last 3 Encounters:  02/10/18 138/70  01/29/18 (!) 160/78  01/07/18 (!) 164/70   Health Maintenance Due  Topic Date Due  . Hepatitis C Screening  Aug 13, 1945  . TETANUS/TDAP  07/10/1964  . MAMMOGRAM  07/11/1995  . COLONOSCOPY  07/11/1995  . DEXA SCAN  07/11/2010  . PNA vac Low Risk Adult (1 of 2 - PCV13) 07/11/2010    There are no preventive care reminders to display for this patient.  No results found for: TSH No results found for: WBC, HGB, HCT, MCV, PLT No results found for: NA, K, CHLORIDE, CO2, GLUCOSE, BUN, CREATININE, BILITOT, ALKPHOS, AST, ALT, PROT, ALBUMIN, CALCIUM, ANIONGAP, EGFR, GFR No results found for: CHOL No results found for: HDL No results found for: LDLCALC No results found for: TRIG No results found for: CHOLHDL No results found for: HGBA1C      Assessment & Plan:   Problem List Items Addressed This Visit      Cardiovascular and Mediastinum   Essential hypertension     Other   Bruit of right carotid artery - Primary   Vitamin D deficiency    Other Visit Diagnoses    PND (post-nasal drip)       Relevant Medications   azelastine (ASTELIN) 0.1 % nasal spray      Meds ordered this encounter  Medications  . azelastine (ASTELIN) 0.1 % nasal spray  Sig: Place 1 spray into both nostrils 2 (two) times daily. As needed for post nasal drip    Dispense:  30 mL    Refill:  12   Patient will have labs drawn today.  She will continue lisinopril.  Will try Astelin for postnasal drip.  Follow-up after the first of the year.  Advised her to go ahead and start taking an 81 mg aspirin.  We will be starting a statin with elevated cholesterol and any plaque buildup in her carotid artery weeks. Follow-up: No follow-ups on file.

## 2018-02-11 DIAGNOSIS — E7800 Pure hypercholesterolemia, unspecified: Secondary | ICD-10-CM | POA: Insufficient documentation

## 2018-02-11 MED ORDER — ATORVASTATIN CALCIUM 20 MG PO TABS: 20.0000 mg | ORAL_TABLET | Freq: Every day | ORAL | 1 refills | Status: DC

## 2018-02-11 NOTE — Addendum Note (Signed)
Addended by: Andrez GrimeKREMER, Ola Raap A on: 02/11/2018 08:58 AM   Modules accepted: Orders

## 2018-02-17 ENCOUNTER — Ambulatory Visit (HOSPITAL_BASED_OUTPATIENT_CLINIC_OR_DEPARTMENT_OTHER): Payer: Medicare Other

## 2018-02-26 ENCOUNTER — Ambulatory Visit (HOSPITAL_BASED_OUTPATIENT_CLINIC_OR_DEPARTMENT_OTHER)
Admission: RE | Admit: 2018-02-26 | Discharge: 2018-02-26 | Disposition: A | Discharge status: Home / Self Care | Payer: Medicare Other | Source: Ambulatory Visit | Attending: Family Medicine | Admitting: Family Medicine

## 2018-02-26 DIAGNOSIS — R0989 Other specified symptoms and signs involving the circulatory and respiratory systems: Secondary | ICD-10-CM | POA: Diagnosis not present

## 2018-02-26 NOTE — Progress Notes (Signed)
Carotid Duplex performed bilaterally     Right Carotid:Velocities in the right ICA are consistent with a 40-59% stenosis. Non-hemodynamically significant plaque <50% noted in the CCA.  Left Carotid: Velocities in the left ICA are consistent with a 1-39% stenosis. Non-hemodynamically significant plaque noted in the CCA.    Vertebrals: Bilateral vertebral arteries demonstrate antegrade flow.    Subclavians: Normal flow hemodynamics were seen in bilateral subclavian arteries.      02/26/18 Lori Allison RDCS, RVT

## 2018-03-23 ENCOUNTER — Ambulatory Visit (INDEPENDENT_AMBULATORY_CARE_PROVIDER_SITE_OTHER): Payer: Medicare Other | Admitting: Family Medicine

## 2018-03-23 ENCOUNTER — Encounter: Payer: Self-pay | Admitting: Family Medicine

## 2018-03-23 VITALS — BP 136/70 | HR 100 | Temp 98.2°F | Ht 63.5 in | Wt 118.4 lb

## 2018-03-23 DIAGNOSIS — I739 Peripheral vascular disease, unspecified: Secondary | ICD-10-CM

## 2018-03-23 DIAGNOSIS — I10 Essential (primary) hypertension: Secondary | ICD-10-CM

## 2018-03-23 DIAGNOSIS — E7800 Pure hypercholesterolemia, unspecified: Secondary | ICD-10-CM | POA: Diagnosis not present

## 2018-03-23 LAB — BASIC METABOLIC PANEL
BUN: 14 mg/dL (ref 6–23)
CALCIUM: 9.9 mg/dL (ref 8.4–10.5)
CO2: 29 meq/L (ref 19–32)
Chloride: 103 mEq/L (ref 96–112)
Creatinine, Ser: 0.69 mg/dL (ref 0.40–1.20)
GFR: 88.71 mL/min (ref >60.00)
Glucose, Bld: 129 mg/dL — ABNORMAL HIGH (ref 70–99)
Potassium: 5.6 mEq/L — ABNORMAL HIGH (ref 3.5–5.1)
SODIUM: 139 meq/L (ref 135–145)

## 2018-03-23 MED ORDER — ASPIRIN EC 81 MG PO TBEC: 81.0000 mg | DELAYED_RELEASE_TABLET | Freq: Every day | ORAL | 1 refills | Status: AC

## 2018-03-23 MED ORDER — SIMVASTATIN 20 MG PO TABS: 20.0000 mg | ORAL_TABLET | Freq: Every day | ORAL | 0 refills | Status: DC

## 2018-03-23 MED ORDER — LISINOPRIL 10 MG PO TABS: 10.0000 mg | ORAL_TABLET | Freq: Every day | ORAL | 0 refills | Status: DC

## 2018-03-23 NOTE — Progress Notes (Signed)
Established Patient Office Visit  Subjective:  Patient ID: Lori Allison, female    DOB: 10/05/1945  Age: 73 y.o. MRN: [id number]  CC:  Chief Complaint  Patient presents with  . Follow-up    on carotid dopplers     HPI [name] presents for follow-up of her bilateral carotid artery stenosis, hypertension and elevated LDL cholesterol.  Blood pressure by her home monitor is been running consistently in the less than 120 over less than 60 range on the lisinopril 20 mg.  She did not tolerate the atorvastatin 20 mg.  She had developed body wide muscle aches nausea and malaise while taking the drug.  She is in need of bilateral cataract extraction and left knee replacement.  Past Medical History:  Diagnosis Date  . Chicken pox   . Hay fever   . UTI (urinary tract infection)     History reviewed. No pertinent surgical history.  Family History  Problem Relation Age of Onset  . High blood pressure Mother   . Heart disease Father   . High blood pressure Sister   . Early death Brother     Social History   Socioeconomic History  . Marital status: Divorced    Spouse name: Not on file  . Number of children: Not on file  . Years of education: Not on file  . Highest education level: Not on file  Occupational History  . Not on file  Social Needs  . Financial resource strain: Not on file  . Food insecurity:    Worry: Not on file    Inability: Not on file  . Transportation needs:    Medical: Not on file    Non-medical: Not on file  Tobacco Use  . Smoking status: Former Games developer  . Smokeless tobacco: Former Engineer, water and Sexual Activity  . Alcohol use: Not on file  . Drug use: Not on file  . Sexual activity: Not on file  Lifestyle  . Physical activity:    Days per week: Not on file    Minutes per session: Not on file  . Stress: Not on file  Relationships  . Social connections:    Talks on phone: Not on file    Gets together: Not on file    Attends religious  service: Not on file    Active member of club or organization: Not on file    Attends meetings of clubs or organizations: Not on file    Relationship status: Not on file  . Intimate partner violence:    Fear of current or ex partner: Not on file    Emotionally abused: Not on file    Physically abused: Not on file    Forced sexual activity: Not on file  Other Topics Concern  . Not on file  Social History Narrative  . Not on file    Outpatient Medications Prior to Visit  Medication Sig Dispense Refill  . azelastine (ASTELIN) 0.1 % nasal spray Place 1 spray into both nostrils 2 (two) times daily. As needed for post nasal drip 30 mL 12  . Multiple Vitamin (MULTIVITAMIN) tablet Take 1 tablet by mouth daily.    Marland Kitchen omega-3 acid ethyl esters (LOVAZA) 1 g capsule Take by mouth daily.    . Vitamin D, Ergocalciferol, (DRISDOL) 50000 units CAPS capsule Take 1 capsule (50,000 Units total) by mouth every 7 (seven) days. 12 capsule 0  . lisinopril (PRINIVIL,ZESTRIL) 20 MG tablet Take 1 tablet (20 mg  total) by mouth daily. 90 tablet 0  . atorvastatin (LIPITOR) 20 MG tablet Take 1 tablet (20 mg total) by mouth daily. 90 tablet 1   No facility-administered medications prior to visit.     No Known Allergies  ROS Review of Systems  Constitutional: Negative for diaphoresis, fatigue, fever and unexpected weight change.  HENT: Negative.   Eyes: Positive for visual disturbance. Negative for photophobia.  Respiratory: Negative.   Cardiovascular: Negative.   Gastrointestinal: Negative.   Endocrine: Negative for polyphagia and polyuria.  Genitourinary: Negative.   Musculoskeletal: Positive for arthralgias, gait problem and joint swelling.  Skin: Negative for pallor and rash.  Allergic/Immunologic: Negative for immunocompromised state.  Neurological: Negative for light-headedness and headaches.  Hematological: Does not bruise/bleed easily.  Psychiatric/Behavioral: Negative.       Objective:      Physical Exam  Constitutional: She is oriented to person, place, and time. She appears well-developed and well-nourished. No distress.  HENT:  Head: Normocephalic and atraumatic.  Right Ear: External ear normal.  Left Ear: External ear normal.  Eyes: Right eye exhibits no discharge. Left eye exhibits no discharge. No scleral icterus.  Neck: No JVD present. No tracheal deviation present.  Pulmonary/Chest: Effort normal. No stridor.  Neurological: She is alert and oriented to person, place, and time.  Skin: Skin is warm and dry. She is not diaphoretic.  Psychiatric: She has a normal mood and affect. Her behavior is normal.    BP 136/70   Pulse 100   Temp 98.2 F (36.8 C) (Oral)   Ht 5' 3.5" (1.613 m)   Wt 118 lb 6 oz (53.7 kg)   SpO2 99%   BMI 20.64 kg/m  Wt Readings from Last 3 Encounters:  03/23/18 118 lb 6 oz (53.7 kg)  01/29/18 118 lb (53.5 kg)  01/07/18 118 lb (53.5 kg)   BP Readings from Last 3 Encounters:  03/23/18 136/70  02/10/18 138/70  01/29/18 (!) 160/78   Guideline developer:  UpToDate (see UpToDate for funding source) Date Released: June 2014  Health Maintenance Due  Topic Date Due  . Hepatitis C Screening  [birth year]  . TETANUS/TDAP  07/10/1964  . MAMMOGRAM  07/11/1995  . COLONOSCOPY  07/11/1995  . DEXA SCAN  07/11/2010  . PNA vac Low Risk Adult (1 of 2 - PCV13) 07/11/2010    There are no preventive care reminders to display for this patient.  No results found for: TSH Lab Results  Component Value Date   WBC 8.4 02/10/2018   HGB 14.4 02/10/2018   HCT 43.2 02/10/2018   MCV 88.5 02/10/2018   PLT 391.0 02/10/2018   Lab Results  Component Value Date   NA 139 02/10/2018   K 5.0 02/10/2018   CO2 26 02/10/2018   GLUCOSE 115 (H) 02/10/2018   BUN 16 02/10/2018   CREATININE 0.63 02/10/2018   BILITOT 0.5 02/10/2018   ALKPHOS 133 (H) 02/10/2018   AST 19 02/10/2018   ALT 20 02/10/2018   PROT 6.6 02/10/2018   ALBUMIN 4.2 02/10/2018   CALCIUM 9.5  02/10/2018   GFR 98.56 02/10/2018   Lab Results  Component Value Date   CHOL 245 (H) 02/10/2018   Lab Results  Component Value Date   HDL 70.90 02/10/2018   Lab Results  Component Value Date   LDLCALC 155 (H) 02/10/2018   Lab Results  Component Value Date   TRIG 98.0 02/10/2018   Lab Results  Component Value Date   CHOLHDL 3 02/10/2018  No results found for: HGBA1C    Assessment & Plan:   Problem List Items Addressed This Visit      Cardiovascular and Mediastinum   Essential hypertension - Primary   Relevant Medications   simvastatin (ZOCOR) 20 MG tablet   lisinopril (PRINIVIL,ZESTRIL) 10 MG tablet   aspirin EC 81 MG tablet   Other Relevant Orders   Basic metabolic panel   PVD (peripheral vascular disease) (HCC)   Relevant Medications   simvastatin (ZOCOR) 20 MG tablet   lisinopril (PRINIVIL,ZESTRIL) 10 MG tablet   aspirin EC 81 MG tablet     Other   Elevated LDL cholesterol level   Relevant Medications   simvastatin (ZOCOR) 20 MG tablet   aspirin EC 81 MG tablet      Meds ordered this encounter  Medications  . simvastatin (ZOCOR) 20 MG tablet    Sig: Take 1 tablet (20 mg total) by mouth at bedtime.    Dispense:  90 tablet    Refill:  0  . lisinopril (PRINIVIL,ZESTRIL) 10 MG tablet    Sig: Take 1 tablet (10 mg total) by mouth daily.    Dispense:  90 tablet    Refill:  0  . aspirin EC 81 MG tablet    Sig: Take 1 tablet (81 mg total) by mouth daily.    Dispense:  365 tablet    Refill:  1    Follow-up: Return in about 1 month (around 04/23/2018).    Lowered her lisinopril 10 mg daily.  She will continue to check her blood pressures.  We will change her to Zocor 20 mg daily.  Hopefully she will tolerate this medicine.  Stressed the importance of treating her cough cholesterol to prevent progression of her carotid artery stenosis.  She agrees.  She is taking an 81 mg aspirin tablet,  as well.

## 2018-03-24 ENCOUNTER — Other Ambulatory Visit: Payer: Self-pay

## 2018-03-24 DIAGNOSIS — R7309 Other abnormal glucose: Secondary | ICD-10-CM

## 2018-04-01 ENCOUNTER — Other Ambulatory Visit (INDEPENDENT_AMBULATORY_CARE_PROVIDER_SITE_OTHER): Payer: Medicare Other

## 2018-04-01 DIAGNOSIS — R7309 Other abnormal glucose: Secondary | ICD-10-CM | POA: Diagnosis not present

## 2018-04-01 LAB — BASIC METABOLIC PANEL
BUN: 15 mg/dL (ref 6–23)
CALCIUM: 9.9 mg/dL (ref 8.4–10.5)
CO2: 27 mEq/L (ref 19–32)
CREATININE: 0.68 mg/dL (ref 0.40–1.20)
Chloride: 102 mEq/L (ref 96–112)
GFR: 90.22 mL/min (ref >60.00)
GLUCOSE: 93 mg/dL (ref 70–99)
POTASSIUM: 4.4 meq/L (ref 3.5–5.1)
Sodium: 138 mEq/L (ref 135–145)

## 2018-04-23 ENCOUNTER — Ambulatory Visit: Payer: Medicare Other | Admitting: Family Medicine

## 2018-04-27 ENCOUNTER — Encounter: Payer: Self-pay | Admitting: Family Medicine

## 2018-04-27 ENCOUNTER — Ambulatory Visit (INDEPENDENT_AMBULATORY_CARE_PROVIDER_SITE_OTHER): Payer: Medicare Other | Admitting: Family Medicine

## 2018-04-27 VITALS — BP 132/72 | HR 105 | Ht 63.5 in | Wt 115.1 lb

## 2018-04-27 DIAGNOSIS — I739 Peripheral vascular disease, unspecified: Secondary | ICD-10-CM | POA: Diagnosis not present

## 2018-04-27 DIAGNOSIS — I10 Essential (primary) hypertension: Secondary | ICD-10-CM | POA: Diagnosis not present

## 2018-04-27 DIAGNOSIS — E7800 Pure hypercholesterolemia, unspecified: Secondary | ICD-10-CM | POA: Diagnosis not present

## 2018-04-27 DIAGNOSIS — M25562 Pain in left knee: Secondary | ICD-10-CM

## 2018-04-27 DIAGNOSIS — G8929 Other chronic pain: Secondary | ICD-10-CM | POA: Insufficient documentation

## 2018-04-27 MED ORDER — DICLOFENAC SODIUM 1 % TD GEL: 2.0000 g | Freq: Three times a day (TID) | TRANSDERMAL | 1 refills | Status: AC | PRN

## 2018-04-27 NOTE — Patient Instructions (Signed)
Mediterranean Diet  A Mediterranean diet refers to food and lifestyle choices that are based on the traditions of countries located on the [place]. This way of eating has been shown to help prevent certain conditions and improve outcomes for people who have chronic diseases, like kidney disease and heart disease.  What are tips for following this plan?  Lifestyle   Cook and eat meals together with your family, when possible.   Drink enough fluid to keep your urine clear or pale yellow.   Be physically active every day. This includes:  ? Aerobic exercise like running or swimming.  ? Leisure activities like gardening, walking, or housework.   Get 7-8 hours of sleep each night.   If recommended by your health care provider, drink red wine in moderation. This means 1 glass a day for nonpregnant women and 2 glasses a day for men. A glass of wine equals 5 oz (150 mL).  Reading food labels     Check the serving size of packaged foods. For foods such as rice and pasta, the serving size refers to the amount of cooked product, not dry.   Check the total fat in packaged foods. Avoid foods that have saturated fat or trans fats.   Check the ingredients list for added sugars, such as corn syrup.  Shopping   At the grocery store, buy most of your food from the areas near the walls of the store. This includes:  ? Fresh fruits and vegetables (produce).  ? Grains, beans, nuts, and seeds. Some of these may be available in unpackaged forms or large amounts (in bulk).  ? Fresh seafood.  ? Poultry and eggs.  ? Low-fat dairy products.   Buy whole ingredients instead of prepackaged foods.   Buy fresh fruits and vegetables in-season from local farmers markets.   Buy frozen fruits and vegetables in resealable bags.   If you do not have access to quality fresh seafood, buy precooked frozen shrimp or canned fish, such as tuna, salmon, or sardines.   Buy small amounts of raw or cooked vegetables, salads, or olives from  the deli or salad bar at your store.   Stock your pantry so you always have certain foods on hand, such as olive oil, canned tuna, canned tomatoes, rice, pasta, and beans.  Cooking   Cook foods with extra-virgin olive oil instead of using butter or other vegetable oils.   Have meat as a side dish, and have vegetables or grains as your main dish. This means having meat in small portions or adding small amounts of meat to foods like pasta or stew.   Use beans or vegetables instead of meat in common dishes like chili or lasagna.   Experiment with different cooking methods. Try roasting or broiling vegetables instead of steaming or sauteing them.   Add frozen vegetables to soups, stews, pasta, or rice.   Add nuts or seeds for added healthy fat at each meal. You can add these to yogurt, salads, or vegetable dishes.   Marinate fish or vegetables using olive oil, lemon juice, garlic, and fresh herbs.  Meal planning     Plan to eat 1 vegetarian meal one day each week. Try to work up to 2 vegetarian meals, if possible.   Eat seafood 2 or more times a week.   Have healthy snacks readily available, such as:  ? Vegetable sticks with hummus.  ? Greek yogurt.  ? Fruit and nut trail mix.   Eat balanced   meals throughout the week. This includes:  ? Fruit: 2-3 servings a day  ? Vegetables: 4-5 servings a day  ? Low-fat dairy: 2 servings a day  ? Fish, poultry, or lean meat: 1 serving a day  ? Beans and legumes: 2 or more servings a week  ? Nuts and seeds: 1-2 servings a day  ? Whole grains: 6-8 servings a day  ? Extra-virgin olive oil: 3-4 servings a day   Limit red meat and sweets to only a few servings a month  What are my food choices?   Mediterranean diet  ? Recommended  ? Grains: Whole-grain pasta. Brown rice. Bulgar wheat. Polenta. Couscous. Whole-wheat bread. Oatmeal. Quinoa.  ? Vegetables: Artichokes. Beets. Broccoli. Cabbage. Carrots. Eggplant. Green beans. Chard. Kale. Spinach. Onions. Leeks. Peas. Squash.  Tomatoes. Peppers. Radishes.  ? Fruits: Apples. Apricots. Avocado. Berries. Bananas. Cherries. Dates. Figs. Grapes. Lemons. Melon. Oranges. Peaches. Plums. Pomegranate.  ? Meats and other protein foods: Beans. Almonds. Sunflower seeds. Pine nuts. Peanuts. Cod. Salmon. Scallops. Shrimp. Tuna. Tilapia. Clams. Oysters. Eggs.  ? Dairy: Low-fat milk. Cheese. Greek yogurt.  ? Beverages: Water. Red wine. Herbal tea.  ? Fats and oils: Extra virgin olive oil. Avocado oil. Grape seed oil.  ? Sweets and desserts: Greek yogurt with honey. Baked apples. Poached pears. Trail mix.  ? Seasoning and other foods: Basil. Cilantro. Coriander. Cumin. Mint. Parsley. Sage. Rosemary. Tarragon. Garlic. Oregano. Thyme. Pepper. Balsalmic vinegar. Tahini. Hummus. Tomato sauce. Olives. Mushrooms.  ? Limit these  ? Grains: Prepackaged pasta or rice dishes. Prepackaged cereal with added sugar.  ? Vegetables: Deep fried potatoes (french fries).  ? Fruits: Fruit canned in syrup.  ? Meats and other protein foods: Beef. Pork. Lamb. Poultry with skin. Hot dogs. Bacon.  ? Dairy: Ice cream. Sour cream. Whole milk.  ? Beverages: Juice. Sugar-sweetened soft drinks. Beer. Liquor and spirits.  ? Fats and oils: Butter. Canola oil. Vegetable oil. Beef fat (tallow). Lard.  ? Sweets and desserts: Cookies. Cakes. Pies. Candy.  ? Seasoning and other foods: Mayonnaise. Premade sauces and marinades.  ? The items listed may not be a complete list. Talk with your dietitian about what dietary choices are right for you.  Summary   The Mediterranean diet includes both food and lifestyle choices.   Eat a variety of fresh fruits and vegetables, beans, nuts, seeds, and whole grains.   Limit the amount of red meat and sweets that you eat.   Talk with your health care provider about whether it is safe for you to drink red wine in moderation. This means 1 glass a day for nonpregnant women and 2 glasses a day for men. A glass of wine equals 5 oz (150 mL).  This information  is not intended to replace advice given to you by your health care provider. Make sure you discuss any questions you have with your health care provider.  Document Released: 10/26/2015 Document Revised: 11/28/2015 Document Reviewed: 10/26/2015  [employer]vier Interactive Patient Education  2019 [employer]

## 2018-04-27 NOTE — Progress Notes (Signed)
Established Patient Office Visit  Subjective:  Patient ID: Lori Allison, female    DOB: 1945/04/17  Age: 73 y.o. MRN: [id number]  CC:  Chief Complaint  Patient presents with  . Follow-up    HPI Lori Allison presents for follow-up of her blood pressure that is has been well controlled with a lisinopril.  She is tolerating the medicine well and has minimal cough.  She has been taking her Zocor and Lovaza for her elevated cholesterol.  She has not been able to exercise due to left knee pain since May of last year.  She has been told that she needs a knee replacement.  The knee causes a lot of pain when she tries to walk on it.  Orthopedist gave her Ultram to try but she has not taken it as of yet.  She is scheduled for consultation for cataract extraction next month.  Continues to care for her mother.  Past Medical History:  Diagnosis Date  . Chicken pox   . Hay fever   . UTI (urinary tract infection)     History reviewed. No pertinent surgical history.  Family History  Problem Relation Age of Onset  . High blood pressure Mother   . Heart disease Father   . High blood pressure Sister   . Early death Brother     Social History   Socioeconomic History  . Marital status: Divorced    Spouse name: Not on file  . Number of children: Not on file  . Years of education: Not on file  . Highest education level: Not on file  Occupational History  . Not on file  Social Needs  . Financial resource strain: Not on file  . Food insecurity:    Worry: Not on file    Inability: Not on file  . Transportation needs:    Medical: Not on file    Non-medical: Not on file  Tobacco Use  . Smoking status: Former Games developermoker  . Smokeless tobacco: Former Engineer, waterUser  Substance and Sexual Activity  . Alcohol use: Not on file  . Drug use: Not on file  . Sexual activity: Not on file  Lifestyle  . Physical activity:    Days per week: Not on file    Minutes per session: Not on file  . Stress: Not on file    Relationships  . Social connections:    Talks on phone: Not on file    Gets together: Not on file    Attends religious service: Not on file    Active member of club or organization: Not on file    Attends meetings of clubs or organizations: Not on file    Relationship status: Not on file  . Intimate partner violence:    Fear of current or ex partner: Not on file    Emotionally abused: Not on file    Physically abused: Not on file    Forced sexual activity: Not on file  Other Topics Concern  . Not on file  Social History Narrative  . Not on file    Outpatient Medications Prior to Visit  Medication Sig Dispense Refill  . aspirin EC 81 MG tablet Take 1 tablet (81 mg total) by mouth daily. 365 tablet 1  . azelastine (ASTELIN) 0.1 % nasal spray Place 1 spray into both nostrils 2 (two) times daily. As needed for post nasal drip 30 mL 12  . lisinopril (PRINIVIL,ZESTRIL) 10 MG tablet Take 1 tablet (10 mg total)  by mouth daily. 90 tablet 0  . Multiple Vitamin (MULTIVITAMIN) tablet Take 1 tablet by mouth daily.    Marland Kitchen omega-3 acid ethyl esters (LOVAZA) 1 g capsule Take by mouth daily.    . simvastatin (ZOCOR) 20 MG tablet Take 1 tablet (20 mg total) by mouth at bedtime. 90 tablet 0  . Vitamin D, Ergocalciferol, (DRISDOL) 50000 units CAPS capsule Take 1 capsule (50,000 Units total) by mouth every 7 (seven) days. 12 capsule 0   No facility-administered medications prior to visit.     No Known Allergies  ROS Review of Systems  Constitutional: Negative for chills, diaphoresis, fatigue, fever and unexpected weight change.  HENT: Negative.   Eyes: Positive for visual disturbance. Negative for photophobia.  Respiratory: Positive for shortness of breath. Negative for chest tightness and wheezing.   Cardiovascular: Negative for chest pain, palpitations and leg swelling.  Gastrointestinal: Negative.   Endocrine: Negative for polyphagia and polyuria.  Genitourinary: Negative.    Musculoskeletal: Positive for arthralgias and gait problem.  Skin: Negative for pallor and rash.  Allergic/Immunologic: Negative for immunocompromised state.  Neurological: Negative for light-headedness and headaches.  Hematological: Does not bruise/bleed easily.  Psychiatric/Behavioral: Negative.       Objective:    Physical Exam  Constitutional: She is oriented to person, place, and time. She appears well-developed and well-nourished. No distress.  HENT:  Head: Normocephalic and atraumatic.  Right Ear: External ear normal.  Left Ear: External ear normal.  Mouth/Throat: Oropharynx is clear and moist. No oropharyngeal exudate.  Eyes: Pupils are equal, round, and reactive to light. Conjunctivae are normal. Right eye exhibits no discharge. Left eye exhibits no discharge. No scleral icterus.  Neck: Neck supple. No JVD present. No tracheal deviation present. No thyromegaly present.  Cardiovascular: Normal rate, regular rhythm and normal heart sounds.  Pulmonary/Chest: Effort normal. No stridor. She has decreased breath sounds. She has no wheezes. She has no rhonchi. She has no rales.  Abdominal: Bowel sounds are normal.  Lymphadenopathy:    She has no cervical adenopathy.  Neurological: She is alert and oriented to person, place, and time.  Skin: Skin is warm and dry. She is not diaphoretic.  Psychiatric: She has a normal mood and affect. Her behavior is normal.    BP 132/72   Pulse (!) 105   Ht 5' 3.5" (1.613 m)   Wt 115 lb 2 oz (52.2 kg)   SpO2 99%   BMI 20.07 kg/m  Wt Readings from Last 3 Encounters:  04/27/18 115 lb 2 oz (52.2 kg)  03/23/18 118 lb 6 oz (53.7 kg)  01/29/18 118 lb (53.5 kg)   BP Readings from Last 3 Encounters:  04/27/18 132/72  03/23/18 136/70  02/10/18 138/70   Guideline developer:  UpToDate (see UpToDate for funding source) Date Released: June 2014  Health Maintenance Due  Topic Date Due  . Hepatitis C Screening  02/14/46  . TETANUS/TDAP   07/10/1964  . MAMMOGRAM  07/11/1995  . COLONOSCOPY  07/11/1995  . DEXA SCAN  07/11/2010  . PNA vac Low Risk Adult (1 of 2 - PCV13) 07/11/2010    There are no preventive care reminders to display for this patient.  No results found for: TSH Lab Results  Component Value Date   WBC 8.4 02/10/2018   HGB 14.4 02/10/2018   HCT 43.2 02/10/2018   MCV 88.5 02/10/2018   PLT 391.0 02/10/2018   Lab Results  Component Value Date   NA 138 04/01/2018   K 4.4  04/01/2018   CO2 27 04/01/2018   GLUCOSE 93 04/01/2018   BUN 15 04/01/2018   CREATININE 0.68 04/01/2018   BILITOT 0.5 02/10/2018   ALKPHOS 133 (H) 02/10/2018   AST 19 02/10/2018   ALT 20 02/10/2018   PROT 6.6 02/10/2018   ALBUMIN 4.2 02/10/2018   CALCIUM 9.9 04/01/2018   GFR 90.22 04/01/2018   Lab Results  Component Value Date   CHOL 245 (H) 02/10/2018   Lab Results  Component Value Date   HDL 70.90 02/10/2018   Lab Results  Component Value Date   LDLCALC 155 (H) 02/10/2018   Lab Results  Component Value Date   TRIG 98.0 02/10/2018   Lab Results  Component Value Date   CHOLHDL 3 02/10/2018   No results found for: HGBA1C    Assessment & Plan:   Problem List Items Addressed This Visit      Cardiovascular and Mediastinum   Essential hypertension - Primary   PVD (peripheral vascular disease) (HCC)     Other   Elevated LDL cholesterol level   Chronic pain of left knee      Meds ordered this encounter  Medications  . diclofenac sodium (VOLTAREN) 1 % GEL    Sig: Apply 2 g topically 3 (three) times daily as needed.    Dispense:  100 g    Refill:  1    Follow-up: Return return in April for cholesterol recheck. .   Continue lisinopril Lovaza and Zocor.  She was given information on the Mediterranean diet blood pressure and cholesterol control.  She will try Voltaren gel to see if that will helpful with her knee pain and allow her to walk again.  Discussed using inhalers for her shortness of breath and she  wants to hold off for now.  Once again denies chest pain nausea paresis.

## 2018-04-29 ENCOUNTER — Ambulatory Visit: Payer: Self-pay | Admitting: *Deleted

## 2018-04-29 NOTE — Telephone Encounter (Signed)
Pt reports called EMS yesterday after experiencing intermittent lightheadedness (not spinning) hands tingling and "Rapid heart beat."  States has been lightheaded "Off and on and just not feeling well." Pt reports EMS did EKG "Normal" and VSS.  Did not transport to hospital. States she has not felt well since starting statin. Started Zocor 03/23/2018 and lisinopril dose changed from 20mg  to 10mg .  Did not take Zocor this AM. No symptoms presently except "Finger tips tingling a little and very fatigued." States she has been checking her BP at home and has been "Good." Pt calling to request a referral to Dr. Donata Clay or Dr. Sena Hitch per her daughters recommendations. [facility] [place] would inform Dr. Doreene Burke. Instructed to CB if symptoms reoccur. Please advise: [phone]  Reason for Disposition . [1] Fatigue (i.e., tires easily, decreased energy) AND [2] persists > 1 week  Answer Assessment - Initial Assessment Questions 1. DESCRIPTION: "Describe how you are feeling."     Very fatigued today 2. SEVERITY: "How bad is it?"  "Can you stand and walk?"   - MILD - Feels weak or tired, but does not interfere with work, school or normal activities   - MODERATE - Able to stand and walk; weakness interferes with work, school, or normal activities   - SEVERE - Unable to stand or walk     moderate 3. ONSET:  "When did the weakness begin?"     Since starting Zocor 4. CAUSE: "What do you think is causing the weakness?"     Unsure 5. MEDICINES: "Have you recently started a new medicine or had a change in the amount of a medicine?"     Zocor 03/23/2018, Lisinopril adjusted dose 6. OTHER SYMPTOMS: "Do you have any other symptoms?" (e.g., chest pain, fever, cough, SOB, vomiting, diarrhea, bleeding, other areas of pain)     Intermittent lightheadedness, mild SOB with exertion, finger tips tingling yesterday, not presently,      "Just fatigued."  Protocols used: WEAKNESS (GENERALIZED) AND FATIGUE-A-AH

## 2018-04-29 NOTE — Telephone Encounter (Signed)
Please ask patient to rtc.

## 2018-04-29 NOTE — Telephone Encounter (Signed)
I called and spoke with patient. Appointment scheduled for tomorrow at 1:30.

## 2018-04-30 ENCOUNTER — Encounter: Payer: Self-pay | Admitting: Family Medicine

## 2018-04-30 ENCOUNTER — Ambulatory Visit (INDEPENDENT_AMBULATORY_CARE_PROVIDER_SITE_OTHER): Payer: Medicare Other | Admitting: Family Medicine

## 2018-04-30 VITALS — BP 120/70 | HR 100 | Ht 63.5 in | Wt 115.4 lb

## 2018-04-30 DIAGNOSIS — I739 Peripheral vascular disease, unspecified: Secondary | ICD-10-CM | POA: Diagnosis not present

## 2018-04-30 DIAGNOSIS — I10 Essential (primary) hypertension: Secondary | ICD-10-CM | POA: Diagnosis not present

## 2018-04-30 DIAGNOSIS — F419 Anxiety disorder, unspecified: Secondary | ICD-10-CM | POA: Insufficient documentation

## 2018-04-30 DIAGNOSIS — I6523 Occlusion and stenosis of bilateral carotid arteries: Secondary | ICD-10-CM | POA: Diagnosis not present

## 2018-04-30 MED ORDER — METOPROLOL SUCCINATE ER 25 MG PO TB24: 25.0000 mg | ORAL_TABLET | Freq: Every day | ORAL | 0 refills | Status: DC

## 2018-04-30 MED ORDER — ALPRAZOLAM 0.25 MG PO TABS: 0.2500 mg | ORAL_TABLET | Freq: Two times a day (BID) | ORAL | 0 refills | Status: DC | PRN

## 2018-04-30 NOTE — Patient Instructions (Signed)
Alprazolam tablets What is this medicine? ALPRAZOLAM (al PRAY zoe lam) is a benzodiazepine. It is used to treat anxiety and panic attacks. This medicine may be used for other purposes; ask your health care provider or pharmacist if you have questions. COMMON BRAND NAME(S): Xanax What should I tell my health care provider before I take this medicine? They need to know if you have any of these conditions: -an alcohol or drug abuse problem -bipolar disorder, depression, psychosis or other mental health conditions -glaucoma -kidney or liver disease -lung or breathing disease -myasthenia gravis -Parkinson's disease -porphyria -seizures or a history of seizures -suicidal thoughts -an unusual or allergic reaction to alprazolam, other benzodiazepines, foods, dyes, or preservatives -pregnant or trying to get pregnant -breast-feeding How should I use this medicine? Take this medicine by mouth with a glass of water. Follow the directions on the prescription label. Take your medicine at regular intervals. Do not take it more often than directed. Do not stop taking except on your doctor's advice. A special MedGuide will be given to you by the pharmacist with each prescription and refill. Be sure to read this information carefully each time. Talk to your pediatrician regarding the use of this medicine in children. Special care may be needed. Overdosage: If you think you have taken too much of this medicine contact a poison control center or emergency room at once. NOTE: This medicine is only for you. Do not share this medicine with others. What if I miss a dose? If you miss a dose, take it as soon as you can. If it is almost time for your next dose, take only that dose. Do not take double or extra doses. What may interact with this medicine? Do not take this medicine with any of the following medications: -certain antiviral medicines for HIV or AIDS like delavirdine, indinavir -certain medicines for  fungal infections like ketoconazole and itraconazole -narcotic medicines for cough -sodium oxybate This medicine may also interact with the following medications: -alcohol -antihistamines for allergy, cough and cold -certain antibiotics like clarithromycin, erythromycin, isoniazid, rifampin, rifapentine, rifabutin, and troleandomycin -certain medicines for blood pressure, heart disease, irregular heart beat -certain medicines for depression, like amitriptyline, fluoxetine, sertraline -certain medicines for seizures like carbamazepine, oxcarbazepine, phenobarbital, phenytoin, primidone -cimetidine -cyclosporine -female hormones, like estrogens or progestins and birth control pills, patches, rings, or injections -general anesthetics like halothane, isoflurane, methoxyflurane, propofol -grapefruit juice -local anesthetics like lidocaine, pramoxine, tetracaine -medicines that relax muscles for surgery -narcotic medicines for pain -other antiviral medicines for HIV or AIDS -phenothiazines like chlorpromazine, mesoridazine, prochlorperazine, thioridazine This list may not describe all possible interactions. Give your health care provider a list of all the medicines, herbs, non-prescription drugs, or dietary supplements you use. Also tell them if you smoke, drink alcohol, or use illegal drugs. Some items may interact with your medicine. What should I watch for while using this medicine? Tell your doctor or health care professional if your symptoms do not start to get better or if they get worse. Do not stop taking except on your doctor's advice. You may develop a severe reaction. Your doctor will tell you how much medicine to take. You may get drowsy or dizzy. Do not drive, use machinery, or do anything that needs mental alertness until you know how this medicine affects you. To reduce the risk of dizzy and fainting spells, do not stand or sit up quickly, especially if you are an older patient.  Alcohol may increase dizziness and drowsiness. Avoid alcoholic  drinks. If you are taking another medicine that also causes drowsiness, you may have more side effects. Give your health care provider a list of all medicines you use. Your doctor will tell you how much medicine to take. Do not take more medicine than directed. Call emergency for help if you have problems breathing or unusual sleepiness. What side effects may I notice from receiving this medicine? Side effects that you should report to your doctor or health care professional as soon as possible: -allergic reactions like skin rash, itching or hives, swelling of the face, lips, or tongue -breathing problems -confusion -loss of balance or coordination -signs and symptoms of low blood pressure like dizziness; feeling faint or lightheaded, falls; unusually weak or tired -suicidal thoughts or other mood changes Side effects that usually do not require medical attention (report to your doctor or health care professional if they continue or are bothersome): -dizziness -dry mouth -nausea, vomiting -tiredness This list may not describe all possible side effects. Call your doctor for medical advice about side effects. You may report side effects to FDA at 1-800-FDA-1088. Where should I keep my medicine? Keep out of the reach of children. This medicine can be abused. Keep your medicine in a safe place to protect it from theft. Do not share this medicine with anyone. Selling or giving away this medicine is dangerous and against the law. Store at room temperature between 20 and 25 degrees C (68 and 77 degrees F). This medicine may cause accidental overdose and death if taken by other adults, children, or pets. Mix any unused medicine with a substance like cat litter or coffee grounds. Then throw the medicine away in a sealed container like a sealed bag or a coffee can with a lid. Do not use the medicine after the expiration date. NOTE: This sheet is  a summary. It may not cover all possible information. If you have questions about this medicine, talk to your doctor, pharmacist, or health care provider.  2019 Elsevier/Gold Standard (2014-12-01 13:47:25) Metoprolol extended-release tablets What is this medicine? METOPROLOL (me TOE proe lole) is a beta-blocker. Beta-blockers reduce the workload on the heart and help it to beat more regularly. This medicine is used to treat high blood pressure and to prevent chest pain. It is also used to after a heart attack and to prevent an additional heart attack from occurring. This medicine may be used for other purposes; ask your health care provider or pharmacist if you have questions. COMMON BRAND NAME(S): toprol, Toprol XL What should I tell my health care provider before I take this medicine? They need to know if you have any of these conditions: -diabetes -heart or vessel disease like slow heart rate, worsening heart failure, heart block, sick sinus syndrome or Raynaud's disease -kidney disease -liver disease -lung or breathing disease, like asthma or emphysema -pheochromocytoma -thyroid disease -an unusual or allergic reaction to metoprolol, other beta-blockers, medicines, foods, dyes, or preservatives -pregnant or trying to get pregnant -breast-feeding How should I use this medicine? Take this medicine by mouth with a glass of water. Follow the directions on the prescription label. Do not crush or chew. Take this medicine with or immediately after meals. Take your doses at regular intervals. Do not take more medicine than directed. Do not stop taking this medicine suddenly. This could lead to serious heart-related effects. Talk to your pediatrician regarding the use of this medicine in children. While this drug may be prescribed for children as young as 6 years  for selected conditions, precautions do apply. Overdosage: If you think you have taken too much of this medicine contact a poison control  center or emergency room at once. NOTE: This medicine is only for you. Do not share this medicine with others. What if I miss a dose? If you miss a dose, take it as soon as you can. If it is almost time for your next dose, take only that dose. Do not take double or extra doses. What may interact with this medicine? This medicine may interact with the following medications: -certain medicines for blood pressure, heart disease, irregular heart beat -certain medicines for depression, like monoamine oxidase (MAO) inhibitors, fluoxetine, or paroxetine -clonidine -dobutamine -epinephrine -isoproterenol -reserpine This list may not describe all possible interactions. Give your health care provider a list of all the medicines, herbs, non-prescription drugs, or dietary supplements you use. Also tell them if you smoke, drink alcohol, or use illegal drugs. Some items may interact with your medicine. What should I watch for while using this medicine? Visit your doctor or health care professional for regular check ups. Contact your doctor right away if your symptoms worsen. Check your blood pressure and pulse rate regularly. Ask your health care professional what your blood pressure and pulse rate should be, and when you should contact them. You may get drowsy or dizzy. Do not drive, use machinery, or do anything that needs mental alertness until you know how this medicine affects you. Do not sit or stand up quickly, especially if you are an older patient. This reduces the risk of dizzy or fainting spells. Contact your doctor if these symptoms continue. Alcohol may interfere with the effect of this medicine. Avoid alcoholic drinks. What side effects may I notice from receiving this medicine? Side effects that you should report to your doctor or health care professional as soon as possible: -allergic reactions like skin rash, itching or hives -cold or numb hands or feet -depression -difficulty  breathing -faint -fever with sore throat -irregular heartbeat, chest pain -rapid weight gain -swollen legs or ankles Side effects that usually do not require medical attention (report to your doctor or health care professional if they continue or are bothersome): -anxiety or nervousness -change in sex drive or performance -dry skin -headache -nightmares or trouble sleeping -short term memory loss -stomach upset or diarrhea -unusually tired This list may not describe all possible side effects. Call your doctor for medical advice about side effects. You may report side effects to FDA at 1-800-FDA-1088. Where should I keep my medicine? Keep out of the reach of children. Store at room temperature between 15 and 30 degrees C (59 and 86 degrees F). Throw away any unused medicine after the expiration date. NOTE: This sheet is a summary. It may not cover all possible information. If you have questions about this medicine, talk to your doctor, pharmacist, or health care provider.  2019 Elsevier/Gold Standard (2012-11-06 14:41:37)

## 2018-04-30 NOTE — Progress Notes (Signed)
Established Patient Office Visit  Subjective:  Patient ID: Lori Allison, female    DOB: [birth year]  Age: 73 y.o. MRN: [id number]  CC:  Chief Complaint  Patient presents with  . Fatigue    HPI Lori Allison presents for for follow-up status post an episode of acute lightheadedness and palpitation.  EMS was summoned and an EKG obtained at her house was normal.  I did review that EKG.  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  Patient tells of lightheadedness with blood pressures running in the just over 100/70 range.  This is status post decreasing her lisinopril from 20 mg to 10 mg.  She is tolerating the Zocor without myalgias.  She has an appointment scheduled with ophthalmology on March 13.  Past Medical History:  Diagnosis Date  . Chicken pox   . Hay fever   . UTI (urinary tract infection)     History reviewed. No pertinent surgical history.  Family History  Problem Relation Age of Onset  . High blood pressure Mother   . Heart disease Father   . High blood pressure Sister   . Early death Brother     Social History   Socioeconomic History  . Marital status: Divorced    Spouse name: Not on file  . Number of children: Not on file  . Years of education: Not on file  . Highest education level: Not on file  Occupational History  . Not on file  Social Needs  . Financial resource strain: Not on file  . Food insecurity:    Worry: Not on file    Inability: Not on file  . Transportation needs:    Medical: Not on file    Non-medical: Not on file  Tobacco Use  . Smoking status: Former Games developer  . Smokeless tobacco: Former Engineer, water and Sexual Activity  . Alcohol use: Not on file  . Drug use: Not on file  . Sexual activity: Not on file  Lifestyle  . Physical activity:   Days per week: Not on file    Minutes per session: Not on file  . Stress: Not on file  Relationships  . Social connections:    Talks on phone: Not on file    Gets together: Not on file    Attends religious service: Not on file    Active member of club or organization: Not on file    Attends meetings of clubs or organizations: Not on file    Relationship status: Not on file  . Intimate partner violence:    Fear of current or ex partner: Not on file    Emotionally abused: Not on file    Physically abused: Not on file    Forced sexual activity: Not on file  Other Topics Concern  . Not on file  Social History Narrative  . Not on file    Outpatient Medications Prior to Visit  Medication Sig Dispense Refill  . aspirin EC 81 MG tablet Take 1 tablet (81 mg total) by mouth daily. 365 tablet 1  . azelastine (ASTELIN) 0.1 % nasal spray Place 1 spray into both nostrils 2 (two) times daily. As needed for post nasal drip 30 mL 12  . diclofenac sodium (VOLTAREN) 1 % GEL Apply 2 g topically 3 (  three) times daily as needed. 100 g 1  . Multiple Vitamin (MULTIVITAMIN) tablet Take 1 tablet by mouth daily.    Marland Kitchen. omega-3 acid ethyl esters (LOVAZA) 1 g capsule Take by mouth daily.    . simvastatin (ZOCOR) 20 MG tablet Take 1 tablet (20 mg total) by mouth at bedtime. 90 tablet 0  . lisinopril (PRINIVIL,ZESTRIL) 10 MG tablet Take 1 tablet (10 mg total) by mouth daily. 90 tablet 0   No facility-administered medications prior to visit.     No Known Allergies  ROS Review of Systems  Constitutional: Negative.   HENT: Negative.   Eyes: Positive for visual disturbance.  Respiratory: Negative.   Cardiovascular: Negative.   Gastrointestinal: Negative.   Genitourinary: Negative.   Musculoskeletal: Negative.   Skin: Negative for pallor and rash.  Allergic/Immunologic: Negative for immunocompromised state.  Neurological: Positive for light-headedness. Negative for dizziness and headaches.    Hematological: Does not bruise/bleed easily.  Psychiatric/Behavioral: Negative for behavioral problems, confusion, dysphoric mood, self-injury and suicidal ideas. The patient is nervous/anxious.       Objective:    Physical Exam  Constitutional: She is oriented to person, place, and time. She appears well-developed and well-nourished. No distress.  HENT:  Head: Normocephalic and atraumatic.  Right Ear: External ear normal.  Left Ear: External ear normal.  Eyes: Conjunctivae are normal. Right eye exhibits no discharge. Left eye exhibits no discharge. No scleral icterus.  Neck: No JVD present. No tracheal deviation present.  Cardiovascular: Normal rate, regular rhythm and normal heart sounds.  Pulmonary/Chest: Effort normal and breath sounds normal. No stridor.  Neurological: She is alert and oriented to person, place, and time.  Skin: Skin is warm and dry. She is not diaphoretic.  Psychiatric: She has a normal mood and affect. Her behavior is normal.    BP 120/70   Pulse 100   Ht 5' 3.5" (1.613 m)   Wt 115 lb 6 oz (52.3 kg)   SpO2 98%   BMI 20.12 kg/m  Wt Readings from Last 3 Encounters:  04/30/18 115 lb 6 oz (52.3 kg)  04/27/18 115 lb 2 oz (52.2 kg)  03/23/18 118 lb 6 oz (53.7 kg)   BP Readings from Last 3 Encounters:  04/30/18 120/70  04/27/18 132/72  03/23/18 136/70   Guideline developer:  UpToDate (see UpToDate for funding source) Date Released: June 2014  Health Maintenance Due  Topic Date Due  . Hepatitis C Screening  Aug 06, 1945  . TETANUS/TDAP  07/10/1964  . MAMMOGRAM  07/11/1995  . COLONOSCOPY  07/11/1995  . DEXA SCAN  07/11/2010  . PNA vac Low Risk Adult (1 of 2 - PCV13) 07/11/2010    There are no preventive care reminders to display for this patient.  No results found for: TSH Lab Results  Component Value Date   WBC 8.4 02/10/2018   HGB 14.4 02/10/2018   HCT 43.2 02/10/2018   MCV 88.5 02/10/2018   PLT 391.0 02/10/2018   Lab Results  Component  Value Date   NA 138 04/01/2018   K 4.4 04/01/2018   CO2 27 04/01/2018   GLUCOSE 93 04/01/2018   BUN 15 04/01/2018   CREATININE 0.68 04/01/2018   BILITOT 0.5 02/10/2018   ALKPHOS 133 (H) 02/10/2018   AST 19 02/10/2018   ALT 20 02/10/2018   PROT 6.6 02/10/2018   ALBUMIN 4.2 02/10/2018   CALCIUM 9.9 04/01/2018   GFR 90.22 04/01/2018   Lab Results  Component Value Date   CHOL 245 (H)  02/10/2018   Lab Results  Component Value Date   HDL 70.90 02/10/2018   Lab Results  Component Value Date   LDLCALC 155 (H) 02/10/2018   Lab Results  Component Value Date   TRIG 98.0 02/10/2018   Lab Results  Component Value Date   CHOLHDL 3 02/10/2018   No results found for: HGBA1C    Assessment & Plan:   Problem List Items Addressed This Visit      Cardiovascular and Mediastinum   Essential hypertension - Primary   Relevant Medications   metoprolol succinate (TOPROL-XL) 25 MG 24 hr tablet   PVD (peripheral vascular disease) (HCC)   Relevant Medications   metoprolol succinate (TOPROL-XL) 25 MG 24 hr tablet   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 ALPRAZolam (XANAX) 0.25 MG tablet    Sig: Take 1 tablet (0.25 mg total) by mouth 2 (two) times daily as needed for anxiety.    Dispense:  30 tablet    Refill:  0  . metoprolol succinate (TOPROL-XL) 25 MG 24 hr tablet    Sig: Take 1 tablet (25 mg total) by mouth daily.    Dispense:  90 tablet    Refill:  0    Follow-up: Return in about 2 weeks (around 05/14/2018).   We will discontinue lisinopril and change patient over to low-dose metoprolol.  She will follow-up in 2 weeks for recheck.  Gave her the okay to increase her metoprolol to  2 pills if her blood pressure increases to greater than 140/90.  Agreed to give patient scription for Xanax to be used in the short-term.  Patient understands and agrees to its use for short-term only.  She requests referral to vascular surgeon for second opinion and I agree.  She will continue Zocor.  Follow-up in 2 weeks.

## 2018-05-04 ENCOUNTER — Ambulatory Visit (INDEPENDENT_AMBULATORY_CARE_PROVIDER_SITE_OTHER): Payer: Medicare Other | Admitting: Surgery

## 2018-05-04 ENCOUNTER — Other Ambulatory Visit: Payer: Self-pay

## 2018-05-04 ENCOUNTER — Encounter: Payer: Self-pay | Admitting: Surgery

## 2018-05-04 VITALS — BP 159/81 | HR 87 | Temp 97.1°F | Resp 16 | Ht 63.5 in | Wt 115.6 lb

## 2018-05-04 DIAGNOSIS — I6523 Occlusion and stenosis of bilateral carotid arteries: Secondary | ICD-10-CM | POA: Diagnosis not present

## 2018-05-04 NOTE — Progress Notes (Signed)
[facility] [place]  Patient name: Lori Allison MRN: [id number] DOB: October 02, 1945 Sex: female   REQUESTING PROVIDER:    Dr. Doreene Burke   REASON FOR CONSULT:    PVD  HISTORY OF PRESENT ILLNESS:   Lori Allison is a 73 y.o. female, who is referred for evaluation of carotid stenosis.  She recently had a carotid duplex that showed moderate stenosis.  The duplex was done secondary to a carotid bruit.  She is asymptomatic.  Specifically she denies numbness or weakness in either extremity.  She denies slurred speech.  She denies amaurosis fugax.  She does state that her father had atherosclerotic cardiac disease.  The patient has a history of smoking but quit over a decade ago.  She is recently started taking a statin for hypercholesterolemia.  She did not tolerate Lipitor but is now taking Zocor without too much difficulty.  She has been on and off an ACE inhibitor for her blood pressure.  Was recently discontinued and she started taking a beta-blocker.  She denies symptoms of claudication.  Her walking is mainly limited by her knees.  PAST MEDICAL HISTORY    Past Medical History:  Diagnosis Date  . Chicken pox   . Hay fever   . UTI (urinary tract infection)      FAMILY HISTORY   Family History  Problem Relation Age of Onset  . High blood pressure Mother   . Heart disease Father   . High blood pressure Sister   . Early death Brother     SOCIAL HISTORY:   Social History   Socioeconomic History  . Marital status: Divorced    Spouse name: Not on file  . Number of children: Not on file  . Years of education: Not on file  . Highest education level: Not on file  Occupational History  . Not on file  Social Needs  . Financial resource strain: Not on file  . Food insecurity:    Worry: Not on file    Inability: Not on file  . Transportation needs:    Medical: Not on file    Non-medical: Not on file  Tobacco Use  .  Smoking status: Former Smoker    Types: Cigarettes    Last attempt to quit: 2008    Years since quitting: 12.1  . Smokeless tobacco: Former Engineer, water and Sexual Activity  . Alcohol use: Not Currently  . Drug use: Never  . Sexual activity: Not on file  Lifestyle  . Physical activity:    Days per week: Not on file    Minutes per session: Not on file  . Stress: Not on file  Relationships  . Social connections:    Talks on phone: Not on file    Gets together: Not on file    Attends religious service: Not on file    Active member of club or organization: Not on file    Attends meetings of clubs or organizations: Not on file    Relationship status: Not on file  . Intimate partner violence:    Fear of current or ex partner: Not on file    Emotionally abused: Not on file    Physically abused: Not on file    Forced sexual activity: Not on file  Other Topics Concern  . Not on file  Social History Narrative  . Not on file    ALLERGIES:    No Known Allergies  CURRENT MEDICATIONS:  Current Outpatient Medications  Medication Sig Dispense Refill  . ALPRAZolam (XANAX) 0.25 MG tablet Take 1 tablet (0.25 mg total) by mouth 2 (two) times daily as needed for anxiety. 30 tablet 0  . aspirin EC 81 MG tablet Take 1 tablet (81 mg total) by mouth daily. 365 tablet 1  . azelastine (ASTELIN) 0.1 % nasal spray Place 1 spray into both nostrils 2 (two) times daily. As needed for post nasal drip 30 mL 12  . diclofenac sodium (VOLTAREN) 1 % GEL Apply 2 g topically 3 (three) times daily as needed. 100 g 1  . metoprolol succinate (TOPROL-XL) 25 MG 24 hr tablet Take 1 tablet (25 mg total) by mouth daily. 90 tablet 0  . Multiple Vitamin (MULTIVITAMIN) tablet Take 1 tablet by mouth daily.    Marland Kitchen omega-3 acid ethyl esters (LOVAZA) 1 g capsule Take by mouth daily.    . simvastatin (ZOCOR) 20 MG tablet Take 1 tablet (20 mg total) by mouth at bedtime. 90 tablet 0   No current facility-administered  medications for this visit.     REVIEW OF SYSTEMS:   [X]  denotes positive finding, [ ]  denotes negative finding Cardiac  Comments:  Chest pain or chest pressure:    Shortness of breath upon exertion: x   Short of breath when lying flat:    Irregular heart rhythm:        Vascular    Pain in calf, thigh, or hip brought on by ambulation:    Pain in feet at night that wakes you up from your sleep:     Blood clot in your veins:    Leg swelling:         Pulmonary    Oxygen at home:    Productive cough:     Wheezing:         Neurologic    Sudden weakness in arms or legs:     Sudden numbness in arms or legs:     Sudden onset of difficulty speaking or slurred speech:    Temporary loss of vision in one eye:     Problems with dizziness:         Gastrointestinal    Blood in stool:      Vomited blood:         Genitourinary    Burning when urinating:     Blood in urine:        Psychiatric    Major depression:         Hematologic    Bleeding problems:    Problems with blood clotting too easily:        Skin    Rashes or ulcers:        Constitutional    Fever or chills:     PHYSICAL EXAM:   Vitals:   05/04/18 1412 05/04/18 1417  BP: (!) 147/74 (!) 159/81  Pulse: 87 87  Resp: 16   Temp: (!) 97.1 F (36.2 C)   TempSrc: Oral   SpO2: 98%   Weight: 115 lb 10.1 oz (52.4 kg)   Height: 5' 3.5" (1.613 m)     GENERAL: The patient is a well-nourished female, in no acute distress. The vital signs are documented above. CARDIAC: There is a regular rate and rhythm.  VASCULAR: Bilateral carotid bruits.  Palpable pedal pulses bilaterally. PULMONARY: Nonlabored respirations ABDOMEN: Soft and non-tender.  No pulsatile mass MUSCULOSKELETAL: There are no major deformities or cyanosis. NEUROLOGIC: No focal weakness or paresthesias are detected. SKIN:  There are no ulcers or rashes noted. PSYCHIATRIC: The patient has a normal affect.  STUDIES:   I have reviewed the ultrasound  with the following results: Right Carotid: Velocities in the right ICA are consistent with a 40-59%                stenosis. Non-hemodynamically significant plaque <50% noted in                the CCA.  Left Carotid: Velocities in the left ICA are consistent with a 40-59% stenosis.               Non-hemodynamically significant plaque noted in the CCA.  Vertebrals:  Bilateral vertebral arteries demonstrate antegrade flow. Subclavians: Normal flow hemodynamics were seen in bilateral subclavian              arteries.  ASSESSMENT and PLAN   Carotid stenosis: I stressed the importance of medical management at this time.  She appears to be on all the appropriate medications.  I have her scheduled for follow-up ultrasound in 1 year.  She will contact me sooner if she has issues.  We also discussed the importance of healthy diet and improving her activity.   Durene CalWells Anshul Meddings, MD [facility] Tel [phone] Pager [phone]

## 2018-05-14 ENCOUNTER — Encounter: Payer: Self-pay | Admitting: Family Medicine

## 2018-05-14 ENCOUNTER — Ambulatory Visit (INDEPENDENT_AMBULATORY_CARE_PROVIDER_SITE_OTHER): Payer: Medicare Other | Admitting: Family Medicine

## 2018-05-14 VITALS — BP 130/80 | HR 58 | Ht 63.5 in | Wt 116.5 lb

## 2018-05-14 DIAGNOSIS — R0989 Other specified symptoms and signs involving the circulatory and respiratory systems: Secondary | ICD-10-CM | POA: Diagnosis not present

## 2018-05-14 DIAGNOSIS — I739 Peripheral vascular disease, unspecified: Secondary | ICD-10-CM | POA: Diagnosis not present

## 2018-05-14 DIAGNOSIS — I6523 Occlusion and stenosis of bilateral carotid arteries: Secondary | ICD-10-CM

## 2018-05-14 DIAGNOSIS — F419 Anxiety disorder, unspecified: Secondary | ICD-10-CM

## 2018-05-14 DIAGNOSIS — I10 Essential (primary) hypertension: Secondary | ICD-10-CM

## 2018-05-14 NOTE — Progress Notes (Signed)
Established Patient Office Visit  Subjective:  Patient ID: Lori Allison, female    DOB: 10/20/1945  Age: 73 y.o. MRN: [id number]  CC:  Chief Complaint  Patient presents with  . Follow-up    HPI Lori Allison presents for follow-up of her palpitations and hypertension.  Blood pressure is well controlled with metoprolol.  It also seems to be helping with the anxiety.  There have been much fewer palpitations.  She did see the vascular doctor who recommended medical treatment at this time.  She will follow-up with him in 1 year.  She is taking her Zocor and metoprolol at night.  She uses the Xanax sparingly.  Past Medical History:  Diagnosis Date  . Chicken pox   . Hay fever   . UTI (urinary tract infection)     History reviewed. No pertinent surgical history.  Family History  Problem Relation Age of Onset  . High blood pressure Mother   . Heart disease Father   . High blood pressure Sister   . Early death Brother     Social History   Socioeconomic History  . Marital status: Divorced    Spouse name: Not on file  . Number of children: Not on file  . Years of education: Not on file  . Highest education level: Not on file  Occupational History  . Not on file  Social Needs  . Financial resource strain: Not on file  . Food insecurity:    Worry: Not on file    Inability: Not on file  . Transportation needs:    Medical: Not on file    Non-medical: Not on file  Tobacco Use  . Smoking status: Former Smoker    Types: Cigarettes    Last attempt to quit: 2008    Years since quitting: 12.1  . Smokeless tobacco: Former Engineer, water and Sexual Activity  . Alcohol use: Not Currently  . Drug use: Never  . Sexual activity: Not on file  Lifestyle  . Physical activity:    Days per week: Not on file    Minutes per session: Not on file  . Stress: Not on file  Relationships  . Social connections:    Talks on phone: Not on file    Gets together: Not on file    Attends  religious service: Not on file    Active member of club or organization: Not on file    Attends meetings of clubs or organizations: Not on file    Relationship status: Not on file  . Intimate partner violence:    Fear of current or ex partner: Not on file    Emotionally abused: Not on file    Physically abused: Not on file    Forced sexual activity: Not on file  Other Topics Concern  . Not on file  Social History Narrative  . Not on file    Outpatient Medications Prior to Visit  Medication Sig Dispense Refill  . ALPRAZolam (XANAX) 0.25 MG tablet Take 1 tablet (0.25 mg total) by mouth 2 (two) times daily as needed for anxiety. 30 tablet 0  . aspirin EC 81 MG tablet Take 1 tablet (81 mg total) by mouth daily. 365 tablet 1  . azelastine (ASTELIN) 0.1 % nasal spray Place 1 spray into both nostrils 2 (two) times daily. As needed for post nasal drip 30 mL 12  . diclofenac sodium (VOLTAREN) 1 % GEL Apply 2 g topically 3 (three) times daily as  needed. 100 g 1  . metoprolol succinate (TOPROL-XL) 25 MG 24 hr tablet Take 1 tablet (25 mg total) by mouth daily. 90 tablet 0  . Multiple Vitamin (MULTIVITAMIN) tablet Take 1 tablet by mouth daily.    Marland Kitchen omega-3 acid ethyl esters (LOVAZA) 1 g capsule Take by mouth daily.    . simvastatin (ZOCOR) 20 MG tablet Take 1 tablet (20 mg total) by mouth at bedtime. 90 tablet 0   No facility-administered medications prior to visit.     No Known Allergies  ROS Review of Systems  Constitutional: Negative.   Respiratory: Negative.   Cardiovascular: Negative.   Gastrointestinal: Negative.   Musculoskeletal: Negative for gait problem and joint swelling.  Neurological: Negative for light-headedness and headaches.      Objective:    Physical Exam  Constitutional: She is oriented to person, place, and time. She appears well-developed and well-nourished. No distress.  HENT:  Head: Normocephalic and atraumatic.  Right Ear: External ear normal.  Left Ear:  External ear normal.  Eyes: Conjunctivae are normal. Right eye exhibits no discharge. Left eye exhibits no discharge. No scleral icterus.  Neck: No JVD present. No tracheal deviation present.  Cardiovascular: Normal rate, regular rhythm and normal heart sounds.  Pulmonary/Chest: Effort normal. No stridor.  Neurological: She is alert and oriented to person, place, and time.  Skin: Skin is warm and dry. She is not diaphoretic.  Psychiatric: She has a normal mood and affect. Her behavior is normal.    BP 130/80   Pulse (!) 58   Ht 5' 3.5" (1.613 m)   Wt 116 lb 8 oz (52.8 kg)   SpO2 100%   BMI 20.31 kg/m  Wt Readings from Last 3 Encounters:  05/14/18 116 lb 8 oz (52.8 kg)  05/04/18 115 lb 10.1 oz (52.4 kg)  04/30/18 115 lb 6 oz (52.3 kg)   BP Readings from Last 3 Encounters:  05/14/18 130/80  05/04/18 (!) 159/81  04/30/18 120/70   Guideline developer:  UpToDate (see UpToDate for funding source) Date Released: June 2014  Health Maintenance Due  Topic Date Due  . Hepatitis C Screening  [birth year]  . TETANUS/TDAP  07/10/1964  . MAMMOGRAM  07/11/1995  . COLONOSCOPY  07/11/1995  . DEXA SCAN  07/11/2010  . PNA vac Low Risk Adult (1 of 2 - PCV13) 07/11/2010    There are no preventive care reminders to display for this patient.  No results found for: TSH Lab Results  Component Value Date   WBC 8.4 02/10/2018   HGB 14.4 02/10/2018   HCT 43.2 02/10/2018   MCV 88.5 02/10/2018   PLT 391.0 02/10/2018   Lab Results  Component Value Date   NA 138 04/01/2018   K 4.4 04/01/2018   CO2 27 04/01/2018   GLUCOSE 93 04/01/2018   BUN 15 04/01/2018   CREATININE 0.68 04/01/2018   BILITOT 0.5 02/10/2018   ALKPHOS 133 (H) 02/10/2018   AST 19 02/10/2018   ALT 20 02/10/2018   PROT 6.6 02/10/2018   ALBUMIN 4.2 02/10/2018   CALCIUM 9.9 04/01/2018   GFR 90.22 04/01/2018   Lab Results  Component Value Date   CHOL 245 (H) 02/10/2018   Lab Results  Component Value Date   HDL 70.90  02/10/2018   Lab Results  Component Value Date   LDLCALC 155 (H) 02/10/2018   Lab Results  Component Value Date   TRIG 98.0 02/10/2018   Lab Results  Component Value Date   CHOLHDL 3  02/10/2018   No results found for: HGBA1C    Assessment & Plan:   Problem List Items Addressed This Visit      Cardiovascular and Mediastinum   Essential hypertension - Primary   PVD (peripheral vascular disease) (HCC)   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   Follow-up: Return in about 2 months (around 07/13/2018).  Continue low dose metoprolol and Zocor.  Xanax as needed.  Follow-up in 2 months for recheck of her cholesterol.

## 2018-06-10 ENCOUNTER — Other Ambulatory Visit: Payer: Self-pay | Admitting: Family Medicine

## 2018-06-10 DIAGNOSIS — F419 Anxiety disorder, unspecified: Secondary | ICD-10-CM

## 2018-06-11 NOTE — Telephone Encounter (Signed)
Needs WebEx

## 2018-06-11 NOTE — Telephone Encounter (Signed)
Patient was told to come back at the end of April, do you want to move that up?

## 2018-06-17 ENCOUNTER — Other Ambulatory Visit: Payer: Self-pay | Admitting: Family Medicine

## 2018-06-17 DIAGNOSIS — I739 Peripheral vascular disease, unspecified: Secondary | ICD-10-CM

## 2018-06-17 DIAGNOSIS — E7800 Pure hypercholesterolemia, unspecified: Secondary | ICD-10-CM

## 2018-07-13 ENCOUNTER — Telehealth: Payer: Self-pay | Admitting: Family Medicine

## 2018-07-13 NOTE — Telephone Encounter (Signed)
I called and left message on patient voicemail to call office and schedule follow up appointment with Dr. Kremer.  °

## 2018-07-17 HISTORY — PX: EYE SURGERY: SHX253

## 2018-07-26 ENCOUNTER — Other Ambulatory Visit: Payer: Self-pay | Admitting: Family Medicine

## 2018-07-26 DIAGNOSIS — I739 Peripheral vascular disease, unspecified: Secondary | ICD-10-CM

## 2018-07-26 DIAGNOSIS — F419 Anxiety disorder, unspecified: Secondary | ICD-10-CM

## 2018-07-26 DIAGNOSIS — I6523 Occlusion and stenosis of bilateral carotid arteries: Secondary | ICD-10-CM

## 2018-07-26 DIAGNOSIS — I10 Essential (primary) hypertension: Secondary | ICD-10-CM

## 2018-09-15 ENCOUNTER — Telehealth: Payer: Self-pay

## 2018-09-15 ENCOUNTER — Other Ambulatory Visit: Payer: Self-pay | Admitting: Family Medicine

## 2018-09-15 DIAGNOSIS — F419 Anxiety disorder, unspecified: Secondary | ICD-10-CM

## 2018-09-15 NOTE — Telephone Encounter (Signed)
Done for tomorrow at 10

## 2018-09-15 NOTE — Telephone Encounter (Signed)
Can we get her scheduled for a follow up? Thanks!

## 2018-09-15 NOTE — Telephone Encounter (Signed)
Okay to schedule pt for telephone visit.

## 2018-09-15 NOTE — Telephone Encounter (Signed)
Patient would like a telephone visit with Ethelene Hal to get refills. No answer on flow line.

## 2018-09-15 NOTE — Telephone Encounter (Signed)
Message sent up front to scheduling.  

## 2018-09-16 ENCOUNTER — Ambulatory Visit (INDEPENDENT_AMBULATORY_CARE_PROVIDER_SITE_OTHER): Payer: Medicare Other | Admitting: Family Medicine

## 2018-09-16 ENCOUNTER — Encounter: Payer: Self-pay | Admitting: Family Medicine

## 2018-09-16 VITALS — BP 122/69 | HR 70 | Wt 115.0 lb

## 2018-09-16 DIAGNOSIS — I6523 Occlusion and stenosis of bilateral carotid arteries: Secondary | ICD-10-CM

## 2018-09-16 DIAGNOSIS — E7800 Pure hypercholesterolemia, unspecified: Secondary | ICD-10-CM

## 2018-09-16 DIAGNOSIS — I10 Essential (primary) hypertension: Secondary | ICD-10-CM | POA: Diagnosis not present

## 2018-09-16 DIAGNOSIS — F419 Anxiety disorder, unspecified: Secondary | ICD-10-CM

## 2018-09-16 DIAGNOSIS — I739 Peripheral vascular disease, unspecified: Secondary | ICD-10-CM | POA: Diagnosis not present

## 2018-09-16 NOTE — Progress Notes (Signed)
Established Patient Office Visit  Subjective:  Patient ID: Lori Allison, female    DOB: 1946-01-17  Age: 73 y.o. MRN: [id number]  CC:  Chief Complaint  Patient presents with  . Medication Refill    HPI Lori Allison presents for follow up of htn and elevated cholesterol. Using xanax sparingly. No issues with metoprolol  Or zocor. BP has been running in the 120s over 70s.   Past Medical History:  Diagnosis Date  . Chicken pox   . Hay fever   . UTI (urinary tract infection)     History reviewed. No pertinent surgical history.  Family History  Problem Relation Age of Onset  . High blood pressure Mother   . Heart disease Father   . High blood pressure Sister   . Early death Brother     Social History   Socioeconomic History  . Marital status: Divorced    Spouse name: Not on file  . Number of children: Not on file  . Years of education: Not on file  . Highest education level: Not on file  Occupational History  . Not on file  Social Needs  . Financial resource strain: Not on file  . Food insecurity    Worry: Not on file    Inability: Not on file  . Transportation needs    Medical: Not on file    Non-medical: Not on file  Tobacco Use  . Smoking status: Former Smoker    Types: Cigarettes    Quit date: 2008    Years since quitting: 12.5  . Smokeless tobacco: Former Engineer, waterUser  Substance and Sexual Activity  . Alcohol use: Not Currently  . Drug use: Never  . Sexual activity: Not on file  Lifestyle  . Physical activity    Days per week: Not on file    Minutes per session: Not on file  . Stress: Not on file  Relationships  . Social Musicianconnections    Talks on phone: Not on file    Gets together: Not on file    Attends religious service: Not on file    Active member of club or organization: Not on file    Attends meetings of clubs or organizations: Not on file    Relationship status: Not on file  . Intimate partner violence    Fear of current or ex partner: Not on  file    Emotionally abused: Not on file    Physically abused: Not on file    Forced sexual activity: Not on file  Other Topics Concern  . Not on file  Social History Narrative  . Not on file    Outpatient Medications Prior to Visit  Medication Sig Dispense Refill  . ALPRAZolam (XANAX) 0.25 MG tablet TAKE 1 TABLET(0.25 MG) BY MOUTH TWICE DAILY AS NEEDED FOR ANXIETY 30 tablet 0  . aspirin EC 81 MG tablet Take 1 tablet (81 mg total) by mouth daily. 365 tablet 1  . azelastine (ASTELIN) 0.1 % nasal spray Place 1 spray into both nostrils 2 (two) times daily. As needed for post nasal drip 30 mL 12  . diclofenac sodium (VOLTAREN) 1 % GEL Apply 2 g topically 3 (three) times daily as needed. 100 g 1  . metoprolol succinate (TOPROL-XL) 25 MG 24 hr tablet TAKE 1 TABLET(25 MG) BY MOUTH DAILY 90 tablet 0  . Multiple Vitamin (MULTIVITAMIN) tablet Take 1 tablet by mouth daily.    Marland Kitchen. omega-3 acid ethyl esters (LOVAZA) 1 g capsule  Take by mouth daily.    . simvastatin (ZOCOR) 20 MG tablet TAKE 1 TABLET(20 MG) BY MOUTH AT BEDTIME 90 tablet 1   No facility-administered medications prior to visit.     No Known Allergies  ROS Review of Systems  Constitutional: Negative.   HENT: Negative.   Respiratory: Negative.   Cardiovascular: Negative.   Gastrointestinal: Negative.   Psychiatric/Behavioral: Negative.       Objective:    Physical Exam  Constitutional: She is oriented to person, place, and time. No distress.  HENT:  Left Ear: External ear normal.  Pulmonary/Chest: Effort normal.  Neurological: She is alert and oriented to person, place, and time.  Psychiatric: She has a normal mood and affect. Her behavior is normal.    BP 122/69   Pulse 70   Wt 115 lb (52.2 kg)   BMI 20.05 kg/m  Wt Readings from Last 3 Encounters:  09/16/18 115 lb (52.2 kg)  05/14/18 116 lb 8 oz (52.8 kg)  05/04/18 115 lb 10.1 oz (52.4 kg)   BP Readings from Last 3 Encounters:  09/16/18 122/69  05/14/18 130/80   05/04/18 (!) 159/81   Guideline developer:  UpToDate (see UpToDate for funding source) Date Released: June 2014  Health Maintenance Due  Topic Date Due  . Hepatitis C Screening  12-Sep-1945  . TETANUS/TDAP  07/10/1964  . MAMMOGRAM  07/11/1995  . COLONOSCOPY  07/11/1995  . DEXA SCAN  07/11/2010    There are no preventive care reminders to display for this patient.  No results found for: TSH Lab Results  Component Value Date   WBC 8.4 02/10/2018   HGB 14.4 02/10/2018   HCT 43.2 02/10/2018   MCV 88.5 02/10/2018   PLT 391.0 02/10/2018   Lab Results  Component Value Date   NA 138 04/01/2018   K 4.4 04/01/2018   CO2 27 04/01/2018   GLUCOSE 93 04/01/2018   BUN 15 04/01/2018   CREATININE 0.68 04/01/2018   BILITOT 0.5 02/10/2018   ALKPHOS 133 (H) 02/10/2018   AST 19 02/10/2018   ALT 20 02/10/2018   PROT 6.6 02/10/2018   ALBUMIN 4.2 02/10/2018   CALCIUM 9.9 04/01/2018   GFR 90.22 04/01/2018   Lab Results  Component Value Date   CHOL 245 (H) 02/10/2018   Lab Results  Component Value Date   HDL 70.90 02/10/2018   Lab Results  Component Value Date   LDLCALC 155 (H) 02/10/2018   Lab Results  Component Value Date   TRIG 98.0 02/10/2018   Lab Results  Component Value Date   CHOLHDL 3 02/10/2018   No results found for: HGBA1C    Assessment & Plan:   Problem List Items Addressed This Visit      Cardiovascular and Mediastinum   Essential hypertension - Primary   Relevant Orders   CBC   Comprehensive metabolic panel   Urinalysis, Routine w reflex microscopic   PVD (peripheral vascular disease) (HCC)   Relevant Orders   LDL cholesterol, direct   Lipid panel   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   Follow-up: Return in about 3 months (around 12/17/2018).   Virtual Visit via  Video Note  I connected with Lori Allison on 09/16/18 at 10:00 AM EDT by a video enabled telemedicine application and verified that I am speaking with the correct person using two identifiers.  Location: Patient: home Provider:    I discussed the limitations of evaluation and management by telemedicine and the availability of in person appointments. The patient expressed understanding and agreed to proceed.  History of Present Illness:    Observations/Objective:   Assessment and Plan:   Follow Up Instructions:    I discussed the assessment and treatment plan with the patient. The patient was provided an opportunity to ask questions and all were answered. The patient agreed with the plan and demonstrated an understanding of the instructions.   The patient was advised to call back or seek an in-person evaluation if the symptoms worsen or if the condition fails to improve as anticipated.  Interactive video and audio telecommunications were attempted between myself and the patient. However they failed due to the patient having technical difficulties or not having access to video capability. We continued and completed with audio only.  I provided 23 minutes of non-face-to-face time during this encounter.   Mliss SaxWilliam Alfred Kremer, MD

## 2018-10-16 ENCOUNTER — Telehealth: Payer: Self-pay

## 2018-10-16 ENCOUNTER — Encounter: Payer: Self-pay | Admitting: Family Medicine

## 2018-10-16 NOTE — Telephone Encounter (Signed)

## 2018-10-19 ENCOUNTER — Other Ambulatory Visit (INDEPENDENT_AMBULATORY_CARE_PROVIDER_SITE_OTHER): Payer: Medicare Other

## 2018-10-19 DIAGNOSIS — I10 Essential (primary) hypertension: Secondary | ICD-10-CM

## 2018-10-19 LAB — COMPREHENSIVE METABOLIC PANEL
ALT: 29 U/L (ref 0–35)
AST: 32 U/L (ref 0–37)
Albumin: 4.1 g/dL (ref 3.5–5.2)
Alkaline Phosphatase: 192 U/L — ABNORMAL HIGH (ref 39–117)
BUN: 12 mg/dL (ref 6–23)
CO2: 27 mEq/L (ref 19–32)
Calcium: 9.4 mg/dL (ref 8.4–10.5)
Chloride: 101 mEq/L (ref 96–112)
Creatinine, Ser: 0.61 mg/dL (ref 0.40–1.20)
GFR: 96.07 mL/min (ref >60.00)
Glucose, Bld: 99 mg/dL (ref 70–99)
Potassium: 4.6 mEq/L (ref 3.5–5.1)
Sodium: 137 mEq/L (ref 135–145)
Total Bilirubin: 0.4 mg/dL (ref 0.2–1.2)
Total Protein: 7.1 g/dL (ref 6.0–8.3)

## 2018-10-19 LAB — URINALYSIS, ROUTINE W REFLEX MICROSCOPIC
Bilirubin Urine: NEGATIVE
Hgb urine dipstick: NEGATIVE
Ketones, ur: NEGATIVE
Nitrite: NEGATIVE
RBC / HPF: NONE SEEN (ref 0–?)
Specific Gravity, Urine: <=1.005 — AB (ref 1.000–1.030)
Total Protein, Urine: NEGATIVE
Urine Glucose: NEGATIVE
Urobilinogen, UA: 0.2 (ref 0.0–1.0)
pH: 6 (ref 5.0–8.0)

## 2018-10-19 LAB — CBC
HCT: 40.4 % (ref 36.0–46.0)
Hemoglobin: 13.1 g/dL (ref 12.0–15.0)
MCHC: 32.4 g/dL (ref 30.0–36.0)
MCV: 91 fl (ref 78.0–100.0)
Platelets: 314 10*3/uL (ref 150.0–400.0)
RBC: 4.44 Mil/uL (ref 3.87–5.11)
RDW: 14.6 % (ref 11.5–15.5)
WBC: 10.1 10*3/uL (ref 4.0–10.5)

## 2018-10-19 LAB — LIPID PANEL
Cholesterol: 164 mg/dL (ref 0–200)
HDL: 63.4 mg/dL (ref >39.00)
LDL Cholesterol: 79 mg/dL (ref 0–99)
NonHDL: 100.79
Total CHOL/HDL Ratio: 3
Triglycerides: 107 mg/dL (ref 0.0–149.0)
VLDL: 21.4 mg/dL (ref 0.0–40.0)

## 2018-10-19 LAB — LDL CHOLESTEROL, DIRECT: Direct LDL: 79 mg/dL

## 2018-10-23 ENCOUNTER — Other Ambulatory Visit: Payer: Self-pay | Admitting: Family Medicine

## 2018-10-23 ENCOUNTER — Encounter: Payer: Self-pay | Admitting: Family Medicine

## 2018-10-23 DIAGNOSIS — I739 Peripheral vascular disease, unspecified: Secondary | ICD-10-CM

## 2018-10-23 DIAGNOSIS — F419 Anxiety disorder, unspecified: Secondary | ICD-10-CM

## 2018-10-23 DIAGNOSIS — I6523 Occlusion and stenosis of bilateral carotid arteries: Secondary | ICD-10-CM

## 2018-10-23 DIAGNOSIS — I10 Essential (primary) hypertension: Secondary | ICD-10-CM

## 2018-12-14 ENCOUNTER — Other Ambulatory Visit: Payer: Self-pay | Admitting: Family Medicine

## 2018-12-14 DIAGNOSIS — F419 Anxiety disorder, unspecified: Secondary | ICD-10-CM

## 2018-12-14 DIAGNOSIS — I739 Peripheral vascular disease, unspecified: Secondary | ICD-10-CM

## 2018-12-14 DIAGNOSIS — E7800 Pure hypercholesterolemia, unspecified: Secondary | ICD-10-CM

## 2018-12-25 ENCOUNTER — Telehealth: Payer: Self-pay

## 2018-12-25 NOTE — Telephone Encounter (Signed)

## 2018-12-28 ENCOUNTER — Other Ambulatory Visit: Payer: Self-pay

## 2018-12-28 ENCOUNTER — Ambulatory Visit (INDEPENDENT_AMBULATORY_CARE_PROVIDER_SITE_OTHER): Payer: Medicare Other | Admitting: Family Medicine

## 2018-12-28 ENCOUNTER — Encounter: Payer: Self-pay | Admitting: Family Medicine

## 2018-12-28 VITALS — BP 120/70 | HR 99 | Ht 63.5 in | Wt 119.4 lb

## 2018-12-28 DIAGNOSIS — R0989 Other specified symptoms and signs involving the circulatory and respiratory systems: Secondary | ICD-10-CM

## 2018-12-28 DIAGNOSIS — Z23 Encounter for immunization: Secondary | ICD-10-CM | POA: Diagnosis not present

## 2018-12-28 DIAGNOSIS — E7800 Pure hypercholesterolemia, unspecified: Secondary | ICD-10-CM | POA: Diagnosis not present

## 2018-12-28 DIAGNOSIS — I739 Peripheral vascular disease, unspecified: Secondary | ICD-10-CM

## 2018-12-28 DIAGNOSIS — I10 Essential (primary) hypertension: Secondary | ICD-10-CM | POA: Diagnosis not present

## 2018-12-28 DIAGNOSIS — F419 Anxiety disorder, unspecified: Secondary | ICD-10-CM | POA: Diagnosis not present

## 2018-12-28 DIAGNOSIS — R748 Abnormal levels of other serum enzymes: Secondary | ICD-10-CM | POA: Insufficient documentation

## 2018-12-28 NOTE — Patient Instructions (Signed)
Osteopenia  Osteopenia is a loss of thickness (density) inside of the bones. Another name for osteopenia is low bone mass. Mild osteopenia is a normal part of aging. It is not a disease, and it does not cause symptoms. However, if you have osteopenia and continue to lose bone mass, you could develop a condition that causes the bones to become thin and break more easily (osteoporosis). You may also lose some height, have back pain, and have a stooped posture. Although osteopenia is not a disease, making changes to your lifestyle and diet can help to prevent osteopenia from developing into osteoporosis. What are the causes? Osteopenia is caused by loss of calcium in the bones.  Bones are constantly changing. Old bone cells are continually being replaced with new bone cells. This process builds new bone. The mineral calcium is needed to build new bone and maintain bone density. Bone density is usually highest around age 35. After that, most people's bodies cannot replace all the bone they have lost with new bone. What increases the risk? You are more likely to develop this condition if:  You are older than age 50.  You are a woman who went through menopause early.  You have a long illness that keeps you in bed.  You do not get enough exercise.  You lack certain nutrients (malnutrition).  You have an overactive thyroid gland (hyperthyroidism).  You smoke.  You drink a lot of alcohol.  You are taking medicines that weaken the bones, such as steroids. What are the signs or symptoms? This condition does not cause any symptoms. You may have a slightly higher risk for bone breaks (fractures), so getting fractures more easily than normal may be an indication of osteopenia. How is this diagnosed? Your health care provider can diagnose this condition with a special type of X-ray exam that measures bone density (dual-energy X-ray absorptiometry, DEXA). This test can measure bone density in your  hips, spine, and wrists. Osteopenia has no symptoms, so this condition is usually diagnosed after a routine bone density screening test is done for osteoporosis. This routine screening is usually done for:  Women who are age 65 or older.  Men who are age 70 or older. If you have risk factors for osteopenia, you may have the screening test at an earlier age. How is this treated? Making dietary and lifestyle changes can lower your risk for osteoporosis. If you have severe osteopenia that is close to becoming osteoporosis, your health care provider may prescribe medicines and dietary supplements such as calcium and vitamin D. These supplements help to rebuild bone density. Follow these instructions at home:   Take over-the-counter and prescription medicines only as told by your health care provider. These include vitamins and supplements.  Eat a diet that is high in calcium and vitamin D. ? Calcium is found in dairy products, beans, salmon, and leafy green vegetables like spinach and broccoli. ? Look for foods that have vitamin D and calcium added to them (fortified foods), such as orange juice, cereal, and bread.  Do 30 or more minutes of a weight-bearing exercise every day, such as walking, jogging, or playing a sport. These types of exercises strengthen the bones.  Take precautions at home to lower your risk of falling, such as: ? Keeping rooms well-lit and free of clutter, such as cords. ? Installing safety rails on stairs. ? Using rubber mats in the bathroom or other areas that are often wet or slippery.  Do not use   any products that contain nicotine or tobacco, such as cigarettes and e-cigarettes. If you need help quitting, ask your health care provider.  Avoid alcohol or limit alcohol intake to no more than 1 drink a day for nonpregnant women and 2 drinks a day for men. One drink equals 12 oz of beer, 5 oz of wine, or 1 oz of hard liquor.  Keep all follow-up visits as told by your  health care provider. This is important. Contact a health care provider if:  You have not had a bone density screening for osteoporosis and you are: ? A woman, age 65 or older. ? A man, age 70 or older.  You are a postmenopausal woman who has not had a bone density screening for osteoporosis.  You are older than age 50 and you want to know if you should have bone density screening for osteoporosis. Summary  Osteopenia is a loss of thickness (density) inside of the bones. Another name for osteopenia is low bone mass.  Osteopenia is not a disease, but it may increase your risk for a condition that causes the bones to become thin and break more easily (osteoporosis).  You may be at risk for osteopenia if you are older than age 50 or if you are a woman who went through early menopause.  Osteopenia does not cause any symptoms, but it can be diagnosed with a bone density screening test.  Dietary and lifestyle changes are the first treatment for osteopenia. These may lower your risk for osteoporosis. This information is not intended to replace advice given to you by your health care provider. Make sure you discuss any questions you have with your health care provider. Document Released: 12/11/2016 Document Revised: 02/14/2017 Document Reviewed: 12/11/2016 Elsevier Patient Education  2020 [employer]  

## 2018-12-28 NOTE — Progress Notes (Signed)
Established Patient Office Visit  Subjective:  Patient ID: Lori Allison, female    DOB: 1945/04/19  Age: 73 y.o. MRN: [id number]  CC:  Chief Complaint  Patient presents with  . Knee Pain    HPI STEPFANIE YOTT presents for for her flu shot today.  She has been sheltering at home and is quite concerned about the Covid pandemic.  She rarely goes out at all.  She maintains compliance with her blood pressure and cholesterol medicine she is taking an aspirin daily.  She is aware of her elevated alkaline phosphatase.  She denies bony pain other than in her knees the left giving her more trouble than the right.  Recent MRI shows multiple issues with the left knee that are probably going to require surgical current reaction.  Patient is not willing to go into the hospital at this point to have that fixed.  She rarely drinks alcohol other than an occasional beer.  She is status post both pneumonia vaccines.  She has not had a bone scan in some time and refuses the next one until Covid has passed.  Past Medical History:  Diagnosis Date  . Chicken pox   . Hay fever   . UTI (urinary tract infection)     History reviewed. No pertinent surgical history.  Family History  Problem Relation Age of Onset  . High blood pressure Mother   . Heart disease Father   . High blood pressure Sister   . Early death Brother     Social History   Socioeconomic History  . Marital status: Divorced    Spouse name: Not on file  . Number of children: Not on file  . Years of education: Not on file  . Highest education level: Not on file  Occupational History  . Not on file  Social Needs  . Financial resource strain: Not on file  . Food insecurity    Worry: Not on file    Inability: Not on file  . Transportation needs    Medical: Not on file    Non-medical: Not on file  Tobacco Use  . Smoking status: Former Smoker    Types: Cigarettes    Quit date: 2008    Years since quitting: 12.7  . Smokeless tobacco:  Former Engineer, water and Sexual Activity  . Alcohol use: Not Currently  . Drug use: Never  . Sexual activity: Not on file  Lifestyle  . Physical activity    Days per week: Not on file    Minutes per session: Not on file  . Stress: Not on file  Relationships  . Social Musician on phone: Not on file    Gets together: Not on file    Attends religious service: Not on file    Active member of club or organization: Not on file    Attends meetings of clubs or organizations: Not on file    Relationship status: Not on file  . Intimate partner violence    Fear of current or ex partner: Not on file    Emotionally abused: Not on file    Physically abused: Not on file    Forced sexual activity: Not on file  Other Topics Concern  . Not on file  Social History Narrative  . Not on file    Outpatient Medications Prior to Visit  Medication Sig Dispense Refill  . ALPRAZolam (XANAX) 0.25 MG tablet TAKE 1 TABLET(0.25 MG) BY MOUTH TWICE  DAILY AS NEEDED FOR ANXIETY 30 tablet 0  . aspirin EC 81 MG tablet Take 1 tablet (81 mg total) by mouth daily. 365 tablet 1  . azelastine (ASTELIN) 0.1 % nasal spray Place 1 spray into both nostrils 2 (two) times daily. As needed for post nasal drip 30 mL 12  . Calcium Carbonate-Vitamin D (OSCAL 500/200 D-3 PO) Take 1 tablet by mouth daily.    . diclofenac sodium (VOLTAREN) 1 % GEL Apply 2 g topically 3 (three) times daily as needed. 100 g 1  . metoprolol succinate (TOPROL-XL) 25 MG 24 hr tablet TAKE 1 TABLET(25 MG) BY MOUTH DAILY 90 tablet 0  . Multiple Vitamin (MULTIVITAMIN) tablet Take 1 tablet by mouth daily.    Marland Kitchen. omega-3 acid ethyl esters (LOVAZA) 1 g capsule Take by mouth daily.    . simvastatin (ZOCOR) 20 MG tablet TAKE 1 TABLET(20 MG) BY MOUTH AT BEDTIME 90 tablet 1   No facility-administered medications prior to visit.     No Known Allergies  ROS Review of Systems  Constitutional: Negative.   HENT: Negative.   Respiratory: Negative.    Cardiovascular: Negative.   Gastrointestinal: Negative.   Genitourinary: Negative.   Musculoskeletal: Positive for arthralgias, gait problem and joint swelling.  Neurological: Negative for seizures, speech difficulty, weakness, numbness and headaches.  Hematological: Negative.   Psychiatric/Behavioral: Negative.       Objective:    Physical Exam  Constitutional: She is oriented to person, place, and time. She appears well-developed and well-nourished. No distress.  HENT:  Head: Normocephalic and atraumatic.  Right Ear: External ear normal.  Left Ear: External ear normal.  Mouth/Throat: Oropharynx is clear and moist. No oropharyngeal exudate.  Eyes: Pupils are equal, round, and reactive to light. Conjunctivae are normal. Right eye exhibits no discharge. Left eye exhibits no discharge. No scleral icterus.  Neck: Neck supple. No JVD present. No tracheal deviation present. No thyromegaly present.  Cardiovascular: Normal rate, regular rhythm and normal heart sounds.  Pulmonary/Chest: Effort normal and breath sounds normal. No stridor.  Abdominal: Bowel sounds are normal. She exhibits no distension. There is no abdominal tenderness. There is no rebound and no guarding.  Musculoskeletal:        General: No edema.  Lymphadenopathy:    She has no cervical adenopathy.  Neurological: She is alert and oriented to person, place, and time.  Skin: Skin is warm and dry. She is not diaphoretic.  Psychiatric: She has a normal mood and affect.    BP 120/70   Pulse 99   Ht 5' 3.5" (1.613 m)   Wt 119 lb 6 oz (54.1 kg)   SpO2 100%   BMI 20.81 kg/m  Wt Readings from Last 3 Encounters:  12/28/18 119 lb 6 oz (54.1 kg)  09/16/18 115 lb (52.2 kg)  05/14/18 116 lb 8 oz (52.8 kg)   BP Readings from Last 3 Encounters:  12/28/18 120/70  09/16/18 122/69  05/14/18 130/80   Guideline developer:  UpToDate (see UpToDate for funding source) Date Released: June 2014  Health Maintenance Due  Topic  Date Due  . Hepatitis C Screening  [birth year]  . TETANUS/TDAP  07/10/1964  . MAMMOGRAM  07/11/1995  . COLONOSCOPY  07/11/1995  . DEXA SCAN  07/11/2010    There are no preventive care reminders to display for this patient.  No results found for: TSH Lab Results  Component Value Date   WBC 10.1 10/19/2018   HGB 13.1 10/19/2018   HCT 40.4 10/19/2018  MCV 91.0 10/19/2018   PLT 314.0 10/19/2018   Lab Results  Component Value Date   NA 137 10/19/2018   K 4.6 10/19/2018   CO2 27 10/19/2018   GLUCOSE 99 10/19/2018   BUN 12 10/19/2018   CREATININE 0.61 10/19/2018   BILITOT 0.4 10/19/2018   ALKPHOS 192 (H) 10/19/2018   AST 32 10/19/2018   ALT 29 10/19/2018   PROT 7.1 10/19/2018   ALBUMIN 4.1 10/19/2018   CALCIUM 9.4 10/19/2018   GFR 96.07 10/19/2018   Lab Results  Component Value Date   CHOL 164 10/19/2018   Lab Results  Component Value Date   HDL 63.40 10/19/2018   Lab Results  Component Value Date   LDLCALC 79 10/19/2018   Lab Results  Component Value Date   TRIG 107.0 10/19/2018   Lab Results  Component Value Date   CHOLHDL 3 10/19/2018   No results found for: HGBA1C    Assessment & Plan:   Problem List Items Addressed This Visit      Cardiovascular and Mediastinum   Essential hypertension   PVD (peripheral vascular disease) ([facility])     Other   Bruit of right carotid artery   Elevated LDL cholesterol level   Anxiety   Elevated alkaline phosphatase level   Need for influenza vaccination - Primary   Relevant Orders   Flu Vaccine QUAD High Dose(Fluad) (Completed)      No orders of the defined types were placed in this encounter.   Follow-up: Return in about 3 months (around 03/30/2019).    Discussed her elevated alkaline phosphatase and that it comes from either her liver or bones.  Liver enzymes have always been normal.  She does have bony pain in her knees but I am concerned about osteoporosis with her body build and history of smoking.  She  has no bony pain elsewhere in her body.  Refuses bone scan for now.  She will continue metoprolol Zocor and her aspirin therapy.

## 2019-01-20 ENCOUNTER — Other Ambulatory Visit: Payer: Self-pay | Admitting: Family Medicine

## 2019-01-20 DIAGNOSIS — I10 Essential (primary) hypertension: Secondary | ICD-10-CM

## 2019-01-20 DIAGNOSIS — F419 Anxiety disorder, unspecified: Secondary | ICD-10-CM

## 2019-01-20 DIAGNOSIS — I739 Peripheral vascular disease, unspecified: Secondary | ICD-10-CM

## 2019-01-20 DIAGNOSIS — I6523 Occlusion and stenosis of bilateral carotid arteries: Secondary | ICD-10-CM

## 2019-01-20 MED ORDER — METOPROLOL SUCCINATE ER 25 MG PO TB24: 25.0000 mg | ORAL_TABLET | Freq: Every day | ORAL | 2 refills | Status: DC

## 2019-02-15 ENCOUNTER — Other Ambulatory Visit: Payer: Self-pay

## 2019-02-15 DIAGNOSIS — R0982 Postnasal drip: Secondary | ICD-10-CM

## 2019-02-15 MED ORDER — AZELASTINE HCL 0.1 % NA SOLN: 1.0000 | Freq: Two times a day (BID) | NASAL | 12 refills | Status: DC

## 2019-02-25 ENCOUNTER — Other Ambulatory Visit: Payer: Self-pay | Admitting: Family Medicine

## 2019-02-25 DIAGNOSIS — F419 Anxiety disorder, unspecified: Secondary | ICD-10-CM

## 2019-02-26 ENCOUNTER — Encounter: Payer: Self-pay | Admitting: Family Medicine

## 2019-02-26 ENCOUNTER — Other Ambulatory Visit: Payer: Self-pay | Admitting: Family Medicine

## 2019-02-26 DIAGNOSIS — F419 Anxiety disorder, unspecified: Secondary | ICD-10-CM

## 2019-02-26 MED ORDER — ALPRAZOLAM 0.25 MG PO TABS: 0.2500 mg | ORAL_TABLET | Freq: Every evening | ORAL | 0 refills | Status: DC | PRN

## 2019-02-26 NOTE — Telephone Encounter (Signed)
Appt scheduled 12/14 @ 2:30pm.

## 2019-03-01 ENCOUNTER — Telehealth (INDEPENDENT_AMBULATORY_CARE_PROVIDER_SITE_OTHER): Payer: Medicare Other | Admitting: Family Medicine

## 2019-03-01 ENCOUNTER — Encounter: Payer: Self-pay | Admitting: Family Medicine

## 2019-03-01 VITALS — BP 125/62 | HR 75 | Ht 63.5 in | Wt 119.0 lb

## 2019-03-01 DIAGNOSIS — F419 Anxiety disorder, unspecified: Secondary | ICD-10-CM

## 2019-03-01 NOTE — Progress Notes (Addendum)
Established Patient Office Visit  Subjective:  Patient ID: Lori CrockerJoan I Borton, female    DOB: 02-Nov-1945  Age: 73 y.o. MRN: [id number]  CC:  Chief Complaint  Patient presents with  . Follow-up  . Anxiety    HPI Lori CrockerJoan I Vanderford presents for for discussion about her anxiety and treatment with Xanax.  Patient continues to shelter at home mostly with the pandemic.  She is concerned about a family member who is a Naval architecttruck driver and not as careful as she would like for him to be with regards to [employer]ov[employer] safety.  She does not leave the house much.  She does admit to some sadness here over the holidays regarding her inability to see her other relatives.  Her daughter is there today working from home.  Past Medical History:  Diagnosis Date  . Chicken pox   . Hay fever   . UTI (urinary tract infection)     History reviewed. No pertinent surgical history.  Family History  Problem Relation Age of Onset  . High blood pressure Mother   . Heart disease Father   . High blood pressure Sister   . Early death Brother     Social History   Socioeconomic History  . Marital status: Divorced    Spouse name: Not on file  . Number of children: Not on file  . Years of education: Not on file  . Highest education level: Not on file  Occupational History  . Not on file  Tobacco Use  . Smoking status: Former Smoker    Types: Cigarettes    Quit date: 2008    Years since quitting: 12.9  . Smokeless tobacco: Former Engineer, waterUser  Substance and Sexual Activity  . Alcohol use: Not Currently  . Drug use: Never  . Sexual activity: Not on file  Other Topics Concern  . Not on file  Social History Narrative  . Not on file   Social Determinants of Health   Financial Resource Strain:   . Difficulty of Paying Living Expenses: Not on file  Food Insecurity:   . Worried About Programme researcher, broadcasting/film/videounning Out of Food in the Last Year: Not on file  . Ran Out of Food in the Last Year: Not on file  Transportation Needs:   . Lack of  Transportation (Medical): Not on file  . Lack of Transportation (Non-Medical): Not on file  Physical Activity:   . Days of Exercise per Week: Not on file  . Minutes of Exercise per Session: Not on file  Stress:   . Feeling of Stress : Not on file  Social Connections:   . Frequency of Communication with Friends and Family: Not on file  . Frequency of Social Gatherings with Friends and Family: Not on file  . Attends Religious Services: Not on file  . Active Member of Clubs or Organizations: Not on file  . Attends BankerClub or Organization Meetings: Not on file  . Marital Status: Not on file  Intimate Partner Violence:   . Fear of Current or Ex-Partner: Not on file  . Emotionally Abused: Not on file  . Physically Abused: Not on file  . Sexually Abused: Not on file    Outpatient Medications Prior to Visit  Medication Sig Dispense Refill  . ALPRAZolam (XANAX) 0.25 MG tablet Take 1 tablet (0.25 mg total) by mouth at bedtime as needed for anxiety. 30 tablet 0  . aspirin EC 81 MG tablet Take 1 tablet (81 mg total) by mouth daily. 365 tablet  1  . azelastine (ASTELIN) 0.1 % nasal spray Place 1 spray into both nostrils 2 (two) times daily. As needed for post nasal drip 30 mL 12  . Calcium Carbonate-Vitamin D (OSCAL 500/200 D-3 PO) Take 1 tablet by mouth daily.    . diclofenac sodium (VOLTAREN) 1 % GEL Apply 2 g topically 3 (three) times daily as needed. 100 g 1  . metoprolol succinate (TOPROL-XL) 25 MG 24 hr tablet Take 1 tablet (25 mg total) by mouth daily. 90 tablet 2  . Multiple Vitamin (MULTIVITAMIN) tablet Take 1 tablet by mouth daily.    Marland Kitchen omega-3 acid ethyl esters (LOVAZA) 1 g capsule Take by mouth daily.    . simvastatin (ZOCOR) 20 MG tablet TAKE 1 TABLET(20 MG) BY MOUTH AT BEDTIME 90 tablet 1   No facility-administered medications prior to visit.    No Known Allergies  ROS Review of Systems  Constitutional: Negative for chills, diaphoresis, fatigue, fever and unexpected weight  change.  HENT: Negative.   Eyes: Negative for photophobia and visual disturbance.  Respiratory: Negative.   Cardiovascular: Negative.   Gastrointestinal: Negative.   Genitourinary: Negative.   Musculoskeletal: Negative for gait problem and joint swelling.  Allergic/Immunologic: Negative for immunocompromised state.  Neurological: Negative for light-headedness and numbness.  Hematological: Does not bruise/bleed easily.  Psychiatric/Behavioral: Positive for dysphoric mood. Negative for self-injury. The patient is nervous/anxious.    Depression screen PHQ 2/9 03/01/2019  Decreased Interest 1  Down, Depressed, Hopeless 1  PHQ - 2 Score 2  Altered sleeping 1  Tired, decreased energy 1  Change in appetite 0  Feeling bad or failure about yourself  0  Trouble concentrating 0  Moving slowly or fidgety/restless 0  Suicidal thoughts 0  PHQ-9 Score 4      Objective:    Physical Exam  BP 125/62   Pulse 75   Ht 5' 3.5" (1.613 m)   Wt 119 lb (54 kg)   BMI 20.75 kg/m  Wt Readings from Last 3 Encounters:  03/01/19 119 lb (54 kg)  12/28/18 119 lb 6 oz (54.1 kg)  09/16/18 115 lb (52.2 kg)     Health Maintenance Due  Topic Date Due  . Hepatitis C Screening  1945/08/11  . TETANUS/TDAP  07/10/1964  . MAMMOGRAM  07/11/1995  . COLONOSCOPY  07/11/1995  . DEXA SCAN  07/11/2010    There are no preventive care reminders to display for this patient.  No results found for: TSH Lab Results  Component Value Date   WBC 10.1 10/19/2018   HGB 13.1 10/19/2018   HCT 40.4 10/19/2018   MCV 91.0 10/19/2018   PLT 314.0 10/19/2018   Lab Results  Component Value Date   NA 137 10/19/2018   K 4.6 10/19/2018   CO2 27 10/19/2018   GLUCOSE 99 10/19/2018   BUN 12 10/19/2018   CREATININE 0.61 10/19/2018   BILITOT 0.4 10/19/2018   ALKPHOS 192 (H) 10/19/2018   AST 32 10/19/2018   ALT 29 10/19/2018   PROT 7.1 10/19/2018   ALBUMIN 4.1 10/19/2018   CALCIUM 9.4 10/19/2018   GFR 96.07  10/19/2018   Lab Results  Component Value Date   CHOL 164 10/19/2018   Lab Results  Component Value Date   HDL 63.40 10/19/2018   Lab Results  Component Value Date   LDLCALC 79 10/19/2018   Lab Results  Component Value Date   TRIG 107.0 10/19/2018   Lab Results  Component Value Date   CHOLHDL 3 10/19/2018  No results found for: HGBA1C    Assessment & Plan:   Problem List Items Addressed This Visit      Other   Anxiety - Primary   Relevant Medications   escitalopram (LEXAPRO) 10 MG tablet      Meds ordered this encounter  Medications  . DISCONTD: PARoxetine (PAXIL) 10 MG tablet    Sig: Take 1 tablet (10 mg total) by mouth daily.    Dispense:  30 tablet    Refill:  2  . escitalopram (LEXAPRO) 10 MG tablet    Sig: Take 1 tablet (10 mg total) by mouth daily.    Dispense:  30 tablet    Refill:  1    Follow-up: Return in about 2 months (around 05/02/2019).  We discussed using alternative therapies.  Patient feels as though her current sadness is mostly related to the holidays.  Told her I would like her to see her Xanax prescription last until February.  Additional treatment will pend future assessment.  12/17: agrees to try Paxil at low dose. Discussed strategies. Follow up in a few months.   Virtual Visit via Video Note  I connected with Lori Allison on 03/08/19 at  2:30 PM EST by a video enabled telemedicine application and verified that I am speaking with the correct person using two identifiers.  Location: Patient: home with daughter Provider:    I discussed the limitations of evaluation and management by telemedicine and the availability of in person appointments. The patient expressed understanding and agreed to proceed.  History of Present Illness:    Observations/Objective:   Assessment and Plan:   Follow Up Instructions:    I discussed the assessment and treatment plan with the patient. The patient was provided an opportunity to ask  questions and all were answered. The patient agreed with the plan and demonstrated an understanding of the instructions.   The patient was advised to call back or seek an in-person evaluation if the symptoms worsen or if the condition fails to improve as anticipated.  I provided 20 minutes of non-face-to-face time during this encounter.   Mliss Sax, MD   Mliss Sax, MD

## 2019-03-04 ENCOUNTER — Encounter: Payer: Self-pay | Admitting: Family Medicine

## 2019-03-04 MED ORDER — PAROXETINE HCL 10 MG PO TABS: 10.0000 mg | ORAL_TABLET | Freq: Every day | ORAL | 2 refills | Status: DC

## 2019-03-08 ENCOUNTER — Telehealth: Payer: Self-pay | Admitting: Family Medicine

## 2019-03-08 MED ORDER — ESCITALOPRAM OXALATE 10 MG PO TABS: 10.0000 mg | ORAL_TABLET | Freq: Every day | ORAL | 1 refills | Status: DC

## 2019-03-08 NOTE — Telephone Encounter (Signed)
Dr. Ethelene Hal,  I received a fax from [facility]. Pt's insurance is not covering the paroxetine and will require a Prior auth. Do you want me to do the PA to see if it gets approved or did you want to switch medication to something else

## 2019-03-08 NOTE — Telephone Encounter (Signed)
Spoke with pt and made her aware of the medication change. Pt understood and had no additional questions at this time.

## 2019-03-08 NOTE — Telephone Encounter (Signed)
Attempted to reach patient. No answer. Vm left to call back  

## 2019-03-08 NOTE — Telephone Encounter (Signed)
Discontinued paxil. Try lexapro.

## 2019-03-08 NOTE — Addendum Note (Signed)
Addended by: Jon Billings on: 03/08/2019 12:58 PM   Modules accepted: Orders

## 2019-04-02 ENCOUNTER — Other Ambulatory Visit: Payer: Self-pay | Admitting: Family Medicine

## 2019-04-02 DIAGNOSIS — I739 Peripheral vascular disease, unspecified: Secondary | ICD-10-CM

## 2019-04-02 DIAGNOSIS — E7800 Pure hypercholesterolemia, unspecified: Secondary | ICD-10-CM

## 2019-04-16 ENCOUNTER — Ambulatory Visit: Payer: Medicare Other

## 2019-04-24 ENCOUNTER — Ambulatory Visit: Payer: Medicare Other | Attending: Internal Medicine

## 2019-04-24 DIAGNOSIS — Z23 Encounter for immunization: Secondary | ICD-10-CM | POA: Insufficient documentation

## 2019-04-24 NOTE — Progress Notes (Signed)
   Covid-19 Vaccination Clinic  Name:  Lori Allison    MRN: [id number] DOB: 1945-06-04  04/24/2019  [name] was observed post Covid-19 immunization for 15 minutes without incidence. She was provided with Vaccine Information Sheet and instruction to access the V-Safe system.   [name] was instructed to call 911 with any severe reactions post vaccine: Marland Kitchen Difficulty breathing  . Swelling of your face and throat  . A fast heartbeat  . A bad rash all over your body  . Dizziness and weakness    Immunizations Administered    Name Date Dose VIS Date Route   Pfizer COVID-19 Vaccine 04/24/2019  5:15 PM 0.3 mL 02/26/2019 Intramuscular   Manufacturer: [employer], [employer]   Lot: [id number]   NDC: [address]-0

## 2019-04-27 ENCOUNTER — Ambulatory Visit: Payer: Medicare Other

## 2019-05-05 ENCOUNTER — Ambulatory Visit (INDEPENDENT_AMBULATORY_CARE_PROVIDER_SITE_OTHER): Payer: Medicare Other | Admitting: *Deleted

## 2019-05-05 ENCOUNTER — Encounter: Payer: Self-pay | Admitting: *Deleted

## 2019-05-05 ENCOUNTER — Other Ambulatory Visit: Payer: Self-pay | Admitting: Family Medicine

## 2019-05-05 ENCOUNTER — Encounter: Payer: Self-pay | Admitting: Family Medicine

## 2019-05-05 VITALS — BP 126/72

## 2019-05-05 DIAGNOSIS — Z0000 Encounter for general adult medical examination without abnormal findings: Secondary | ICD-10-CM

## 2019-05-05 NOTE — Progress Notes (Signed)
Virtual Visit via Audio Note  I connected with patient on 05/05/19 at  1:00 PM EST by audio enabled telemedicine application and verified that I am speaking with the correct person using two identifiers.   THIS ENCOUNTER IS A VIRTUAL VISIT DUE TO COVID-19 - PATIENT WAS NOT SEEN IN THE OFFICE. PATIENT HAS CONSENTED TO VIRTUAL VISIT / TELEMEDICINE VISIT   Location of patient: home  Location of provider: office  I discussed the limitations of evaluation and management by telemedicine and the availability of in person appointments. The patient expressed understanding and agreed to proceed.   Subjective:   Lori Allison is a 74 y.o. female who presents for an Initial Medicare Annual Wellness Visit.  The Patient was informed that the wellness visit is to identify future health risk and educate and initiate measures that can reduce risk for increased disease through the lifespan.   Describes health as fair, good or great? Good.  Review of Systems    Home Safety/Smoke Alarms: Feels safe in home. Smoke alarms in place.   Lives w/ dtr and her fiance. 1 dog and 1 cat. 1 story home.   Female:        Mammo- declines until pandemic is over per pt.     Dexa scan-  declines until pandemic is over per pt.      CCS- declines until pandemic is over per pt.     Objective:    Today's Vitals   05/05/19 1310  BP: 126/72   There is no height or weight on file to calculate BMI.  Advanced Directives 05/05/2019  Does Patient Have a Medical Advance Directive? No  Would patient like information on creating a medical advance directive? No - Patient declined    Current Medications (verified) Outpatient Encounter Medications as of 05/05/2019  Medication Sig  . ALPRAZolam (XANAX) 0.25 MG tablet Take 1 tablet (0.25 mg total) by mouth at bedtime as needed for anxiety.  Marland Kitchen aspirin EC 81 MG tablet Take 1 tablet (81 mg total) by mouth daily.  Marland Kitchen azelastine (ASTELIN) 0.1 % nasal spray Place 1 spray into both  nostrils 2 (two) times daily. As needed for post nasal drip  . Calcium Carbonate-Vitamin D (OSCAL 500/200 D-3 PO) Take 1 tablet by mouth daily.  . diclofenac sodium (VOLTAREN) 1 % GEL Apply 2 g topically 3 (three) times daily as needed.  Marland Kitchen escitalopram (LEXAPRO) 10 MG tablet Take 1 tablet (10 mg total) by mouth daily.  . metoprolol succinate (TOPROL-XL) 25 MG 24 hr tablet Take 1 tablet (25 mg total) by mouth daily.  . Multiple Vitamin (MULTIVITAMIN) tablet Take 1 tablet by mouth daily.  Marland Kitchen omega-3 acid ethyl esters (LOVAZA) 1 g capsule Take by mouth daily.  . simvastatin (ZOCOR) 20 MG tablet TAKE 1 TABLET(20 MG) BY MOUTH AT BEDTIME   No facility-administered encounter medications on file as of 05/05/2019.    Allergies (verified) Patient has no known allergies.   History: Past Medical History:  Diagnosis Date  . Chicken pox   . Hay fever   . UTI (urinary tract infection)    Past Surgical History:  Procedure Laterality Date  . EYE SURGERY Bilateral 07/17/2018   Family History  Problem Relation Age of Onset  . High blood pressure Mother   . Heart disease Father   . High blood pressure Sister   . Early death Brother    Social History   Socioeconomic History  . Marital status: Divorced    Spouse  name: Not on file  . Number of children: Not on file  . Years of education: Not on file  . Highest education level: Not on file  Occupational History  . Not on file  Tobacco Use  . Smoking status: Former Smoker    Types: Cigarettes    Quit date: 2008    Years since quitting: 13.1  . Smokeless tobacco: Former Engineer, water and Sexual Activity  . Alcohol use: Not Currently  . Drug use: Never  . Sexual activity: Not on file  Other Topics Concern  . Not on file  Social History Narrative  . Not on file   Social Determinants of Health   Financial Resource Strain: Low Risk   . Difficulty of Paying Living Expenses: Not hard at all  Food Insecurity: No Food Insecurity  .  Worried About Programme researcher, broadcasting/film/video in the Last Year: Never true  . Ran Out of Food in the Last Year: Never true  Transportation Needs: No Transportation Needs  . Lack of Transportation (Medical): No  . Lack of Transportation (Non-Medical): No  Physical Activity:   . Days of Exercise per Week: Not on file  . Minutes of Exercise per Session: Not on file  Stress:   . Feeling of Stress : Not on file  Social Connections:   . Frequency of Communication with Friends and Family: Not on file  . Frequency of Social Gatherings with Friends and Family: Not on file  . Attends Religious Services: Not on file  . Active Member of Clubs or Organizations: Not on file  . Attends Banker Meetings: Not on file  . Marital Status: Not on file    Tobacco Counseling Counseling given: Not Answered   Clinical Intake: Pain : No/denies pain    Activities of Daily Living In your present state of health, do you have any difficulty performing the following activities: 05/05/2019  Hearing? N  Vision? N  Difficulty concentrating or making decisions? N  Walking or climbing stairs? N  Dressing or bathing? N  Doing errands, shopping? N  Preparing Food and eating ? N  Using the Toilet? N  In the past six months, have you accidently leaked urine? N  Do you have problems with loss of bowel control? N  Managing your Medications? N  Managing your Finances? N  Housekeeping or managing your Housekeeping? N  Some recent data might be hidden     Immunizations and Health Maintenance Immunization History  Administered Date(s) Administered  . Fluad Quad(high Dose 65+) 12/28/2018  . Influenza, High Dose Seasonal PF 12/28/2013, 12/10/2017  . PFIZER SARS-COV-2 Vaccination 04/24/2019  . Pneumococcal Polysaccharide-23 05/24/2018   Health Maintenance Due  Topic Date Due  . Hepatitis C Screening  [birth year]  . TETANUS/TDAP  07/10/1964    Patient Care Team: Mliss Sax, MD as PCP - General  (Family Medicine)  Indicate any recent Medical Services you may have received from other than [facility] providers in the past year (date may be approximate).     Assessment:   This is a routine wellness examination for Lori Allison. Physical assessment deferred to PCP.  Hearing/Vision screen Unable to assess. This visit is enabled though telemedicine due to Covid 19.   Dietary issues and exercise activities discussed: Current Exercise Habits: The patient does not participate in regular exercise at present, Exercise limited by: None identified   Diet (meal preparation, eat out, water intake, caffeinated beverages, dairy products, fruits and vegetables):  Breakfast: egg,  whole wheat toast, chicken sausage, fruit Lunch: skinny girl popcorn Dinner: baked pork chops, squash and peas.    Pt reports she drinks at least 60oz of water per day.  Goals    . Increase physical activity      Depression Screen PHQ 2/9 Scores 05/05/2019 03/01/2019  PHQ - 2 Score 1 2  PHQ- 9 Score - 4    Fall Risk Fall Risk  05/05/2019  Falls in the past year? 0  Number falls in past yr: 0  Injury with Fall? 0  Follow up Education provided;Falls prevention discussed    Cognitive Function: Ad8 score reviewed for issues:  Issues making decisions:no  Less interest in hobbies / activities:no  Repeats questions, stories (family complaining):no  Trouble using ordinary gadgets (microwave, computer, phone):no  Forgets the month or year: no  Mismanaging finances: no  Remembering appts:no  Daily problems with thinking and/or memory:no Ad8 score is=0        Screening Tests Health Maintenance  Topic Date Due  . Hepatitis C Screening  05/10/1945  . TETANUS/TDAP  07/10/1964  . MAMMOGRAM  05/04/2020 (Originally 07/11/1995)  . DEXA SCAN  05/04/2020 (Originally 07/11/2010)  . COLONOSCOPY  05/04/2020 (Originally 07/11/1995)  . PNA vac Low Risk Adult (2 of 2 - PCV13) 05/24/2019  . INFLUENZA VACCINE  Completed       Plan:    Please schedule your next medicare wellness visit with me in 1 yr.  Continue to eat heart healthy diet (full of fruits, vegetables, whole grains, lean protein, water--limit salt, fat, and sugar intake) and increase physical activity as tolerated.  Continue doing brain stimulating activities (puzzles, reading, adult coloring books, staying active) to keep memory sharp.     I have personally reviewed and noted the following in the patient's chart:   . Medical and social history . Use of alcohol, tobacco or illicit drugs  . Current medications and supplements . Functional ability and status . Nutritional status . Physical activity . Advanced directives . List of other physicians . Hospitalizations, surgeries, and ER visits in previous 12 months . Vitals . Screenings to include cognitive, depression, and falls . Referrals and appointments  In addition, I have reviewed and discussed with patient certain preventive protocols, quality metrics, and best practice recommendations. A written personalized care plan for preventive services as well as general preventive health recommendations were provided to patient.     Mady Haagensen [place], [place]   05/05/2019

## 2019-05-05 NOTE — Patient Instructions (Signed)
Please schedule your next medicare wellness visit with me in 1 yr.  Continue to eat heart healthy diet (full of fruits, vegetables, whole grains, lean protein, water--limit salt, fat, and sugar intake) and increase physical activity as tolerated.  Continue doing brain stimulating activities (puzzles, reading, adult coloring books, staying active) to keep memory sharp.    Lori Allison , Thank you for taking time to come for your Medicare Wellness Visit. I appreciate your ongoing commitment to your health goals. Please review the following plan we discussed and let me know if I can assist you in the future.   These are the goals we discussed: Goals    . Increase physical activity       This is a list of the screening recommended for you and due dates:  Health Maintenance  Topic Date Due  .  Hepatitis C: One time screening is recommended by Center for Disease Control  (CDC) for  adults born from [birth year] through 1965.   Aug 04, 1945  . Tetanus Vaccine  07/10/1964  . Mammogram  05/04/2020*  . DEXA scan (bone density measurement)  05/04/2020*  . Colon Cancer Screening  05/04/2020*  . Pneumonia vaccines (2 of 2 - PCV13) 05/24/2019  . Flu Shot  Completed  *Topic was postponed. The date shown is not the original due date.    Preventive Care 9 Years and Older, Female Preventive care refers to lifestyle choices and visits with your health care provider that can promote health and wellness. This includes:  A yearly physical exam. This is also called an annual well check.  Regular dental and eye exams.  Immunizations.  Screening for certain conditions.  Healthy lifestyle choices, such as diet and exercise. What can I expect for my preventive care visit? Physical exam Your health care provider will check:  Height and weight. These may be used to calculate body mass index (BMI), which is a measurement that tells if you are at a healthy weight.  Heart rate and blood pressure.  Your skin for  abnormal spots. Counseling Your health care provider may ask you questions about:  Alcohol, tobacco, and drug use.  Emotional well-being.  Home and relationship well-being.  Sexual activity.  Eating habits.  History of falls.  Memory and ability to understand (cognition).  Work and work Statistician.  Pregnancy and menstrual history. What immunizations do I need?  Influenza (flu) vaccine  This is recommended every year. Tetanus, diphtheria, and pertussis (Tdap) vaccine  You may need a Td booster every 10 years. Varicella (chickenpox) vaccine  You may need this vaccine if you have not already been vaccinated. Zoster (shingles) vaccine  You may need this after age 30. Pneumococcal conjugate (PCV13) vaccine  One dose is recommended after age 62. Pneumococcal polysaccharide (PPSV23) vaccine  One dose is recommended after age 40. Measles, mumps, and rubella (MMR) vaccine  You may need at least one dose of MMR if you were born in 1957 or later. You may also need a second dose. Meningococcal conjugate (MenACWY) vaccine  You may need this if you have certain conditions. Hepatitis A vaccine  You may need this if you have certain conditions or if you travel or work in places where you may be exposed to hepatitis A. Hepatitis B vaccine  You may need this if you have certain conditions or if you travel or work in places where you may be exposed to hepatitis B. Haemophilus influenzae type b (Hib) vaccine  You may need this if you  have certain conditions. You may receive vaccines as individual doses or as more than one vaccine together in one shot (combination vaccines). Talk with your health care provider about the risks and benefits of combination vaccines. What tests do I need? Blood tests  Lipid and cholesterol levels. These may be checked every 5 years, or more frequently depending on your overall health.  Hepatitis C test.  Hepatitis B test. Screening  Lung  cancer screening. You may have this screening every year starting at age 44 if you have a 30-pack-year history of smoking and currently smoke or have quit within the past 15 years.  Colorectal cancer screening. All adults should have this screening starting at age 24 and continuing until age 44. Your health care provider may recommend screening at age 50 if you are at increased risk. You will have tests every 1-10 years, depending on your results and the type of screening test.  Diabetes screening. This is done by checking your blood sugar (glucose) after you have not eaten for a while (fasting). You may have this done every 1-3 years.  Mammogram. This may be done every 1-2 years. Talk with your health care provider about how often you should have regular mammograms.  BRCA-related cancer screening. This may be done if you have a family history of breast, ovarian, tubal, or peritoneal cancers. Other tests  Sexually transmitted disease (STD) testing.  Bone density scan. This is done to screen for osteoporosis. You may have this done starting at age 27. Follow these instructions at home: Eating and drinking  Eat a diet that includes fresh fruits and vegetables, whole grains, lean protein, and low-fat dairy products. Limit your intake of foods with high amounts of sugar, saturated fats, and salt.  Take vitamin and mineral supplements as recommended by your health care provider.  Do not drink alcohol if your health care provider tells you not to drink.  If you drink alcohol: ? Limit how much you have to 0-1 drink a day. ? Be aware of how much alcohol is in your drink. In the [place] one drink equals one 12 oz bottle of beer (355 mL), one 5 oz glass of wine (148 mL), or one 1 oz glass of hard liquor (44 mL). Lifestyle  Take daily care of your teeth and gums.  Stay active. Exercise for at least 30 minutes on 5 or more days each week.  Do not use any products that contain nicotine or tobacco,  such as cigarettes, e-cigarettes, and chewing tobacco. If you need help quitting, ask your health care provider.  If you are sexually active, practice safe sex. Use a condom or other form of protection in order to prevent STIs (sexually transmitted infections).  Talk with your health care provider about taking a low-dose aspirin or statin. What's next?  Go to your health care provider once a year for a well check visit.  Ask your health care provider how often you should have your eyes and teeth checked.  Stay up to date on all vaccines. This information is not intended to replace advice given to you by your health care provider. Make sure you discuss any questions you have with your health care provider. Document Revised: 02/26/2018 Document Reviewed: 02/26/2018 Elsevier Patient Education  2020 [employer].

## 2019-05-17 ENCOUNTER — Other Ambulatory Visit: Payer: Self-pay | Admitting: Family Medicine

## 2019-05-17 DIAGNOSIS — F419 Anxiety disorder, unspecified: Secondary | ICD-10-CM

## 2019-05-19 ENCOUNTER — Other Ambulatory Visit: Payer: Self-pay

## 2019-05-19 ENCOUNTER — Encounter: Payer: Self-pay | Admitting: Family Medicine

## 2019-05-19 ENCOUNTER — Ambulatory Visit: Payer: Medicare Other | Attending: Internal Medicine

## 2019-05-19 DIAGNOSIS — Z23 Encounter for immunization: Secondary | ICD-10-CM | POA: Insufficient documentation

## 2019-05-19 DIAGNOSIS — F419 Anxiety disorder, unspecified: Secondary | ICD-10-CM

## 2019-05-19 MED ORDER — ALPRAZOLAM 0.25 MG PO TABS: 0.2500 mg | ORAL_TABLET | Freq: Every evening | ORAL | 0 refills | Status: DC | PRN

## 2019-05-19 NOTE — Telephone Encounter (Signed)
I called and spoke with patient she sates that she needs a refill on Xanax. I also let her know that I will see if her handicap form could be filled out when Dr. Doreene Burke in back in the office on tomorrow. Patient would like this mailed to her when completed.

## 2019-05-19 NOTE — Progress Notes (Signed)
   [facility]  Name:  ZALI KAMAKA    MRN: [id number] DOB: [birth year]  05/19/2019  Ms. Koopmann was observed post Covid-19 immunization for 15 minutes without incident. She was provided with Vaccine Information Sheet and instruction to access the V-Safe system.   Ms. Ricardo was instructed to call 911 with any severe reactions post vaccine: Marland Kitchen Difficulty breathing  . Swelling of face and throat  . A fast heartbeat  . A bad rash all over body  . Dizziness and weakness   Immunizations Administered    Name Date Dose VIS Date Route   Pfizer COVID-19 Vaccine 05/19/2019  2:10 PM 0.3 mL 02/26/2019 Intramuscular   Manufacturer: [employer], [employer]   Lot: [id number]   NDC: [address]-6

## 2019-05-19 NOTE — Telephone Encounter (Signed)
Patient calling for statu on refill for Xanax 0.25mg . Last virtual visit Dec. 2020, last office visit Oct. 2020. Appointment scheduled for patient to come in for office visit, per patient she is due for her second COVID vaccine next week and she'd rather not come in the office until 2 weeks after having vaccine. Appointment schedule few weeks after vaccine should be placed.

## 2019-06-14 ENCOUNTER — Other Ambulatory Visit: Payer: Self-pay | Admitting: Family Medicine

## 2019-06-14 DIAGNOSIS — F419 Anxiety disorder, unspecified: Secondary | ICD-10-CM

## 2019-06-18 ENCOUNTER — Ambulatory Visit: Payer: Medicare Other | Admitting: Family Medicine

## 2019-06-25 ENCOUNTER — Ambulatory Visit: Payer: Medicare Other | Admitting: Family Medicine

## 2019-07-13 ENCOUNTER — Other Ambulatory Visit: Payer: Self-pay

## 2019-07-14 ENCOUNTER — Encounter: Payer: Self-pay | Admitting: Family Medicine

## 2019-07-14 ENCOUNTER — Ambulatory Visit (INDEPENDENT_AMBULATORY_CARE_PROVIDER_SITE_OTHER): Payer: Medicare Other | Admitting: Family Medicine

## 2019-07-14 VITALS — BP 150/76 | HR 70 | Temp 97.8°F | Ht 63.0 in | Wt 128.8 lb

## 2019-07-14 DIAGNOSIS — R7989 Other specified abnormal findings of blood chemistry: Secondary | ICD-10-CM | POA: Diagnosis not present

## 2019-07-14 DIAGNOSIS — I739 Peripheral vascular disease, unspecified: Secondary | ICD-10-CM

## 2019-07-14 DIAGNOSIS — E7800 Pure hypercholesterolemia, unspecified: Secondary | ICD-10-CM | POA: Diagnosis not present

## 2019-07-14 DIAGNOSIS — G8929 Other chronic pain: Secondary | ICD-10-CM

## 2019-07-14 DIAGNOSIS — M25562 Pain in left knee: Secondary | ICD-10-CM

## 2019-07-14 DIAGNOSIS — F419 Anxiety disorder, unspecified: Secondary | ICD-10-CM | POA: Diagnosis not present

## 2019-07-14 DIAGNOSIS — I10 Essential (primary) hypertension: Secondary | ICD-10-CM

## 2019-07-14 DIAGNOSIS — Z0000 Encounter for general adult medical examination without abnormal findings: Secondary | ICD-10-CM

## 2019-07-14 LAB — HEPATIC FUNCTION PANEL
ALT: 27 U/L (ref 0–35)
AST: 29 U/L (ref 0–37)
Albumin: 4.3 g/dL (ref 3.5–5.2)
Alkaline Phosphatase: 128 U/L — ABNORMAL HIGH (ref 39–117)
Bilirubin, Direct: 0.1 mg/dL (ref 0.0–0.3)
Total Bilirubin: 0.4 mg/dL (ref 0.2–1.2)
Total Protein: 7.2 g/dL (ref 6.0–8.3)

## 2019-07-14 LAB — BASIC METABOLIC PANEL
BUN: 14 mg/dL (ref 6–23)
CO2: 29 mEq/L (ref 19–32)
Calcium: 9.3 mg/dL (ref 8.4–10.5)
Chloride: 104 mEq/L (ref 96–112)
Creatinine, Ser: 0.74 mg/dL (ref 0.40–1.20)
GFR: 76.72 mL/min (ref >60.00)
Glucose, Bld: 115 mg/dL — ABNORMAL HIGH (ref 70–99)
Potassium: 4.5 mEq/L (ref 3.5–5.1)
Sodium: 139 mEq/L (ref 135–145)

## 2019-07-14 LAB — URINALYSIS, ROUTINE W REFLEX MICROSCOPIC
Bilirubin Urine: NEGATIVE
Ketones, ur: NEGATIVE
Nitrite: NEGATIVE
Specific Gravity, Urine: <=1.005 — AB (ref 1.000–1.030)
Total Protein, Urine: NEGATIVE
Urine Glucose: NEGATIVE
Urobilinogen, UA: 0.2 (ref 0.0–1.0)
pH: 6 (ref 5.0–8.0)

## 2019-07-14 LAB — LIPID PANEL
Cholesterol: 207 mg/dL — ABNORMAL HIGH (ref 0–200)
HDL: 73.1 mg/dL (ref >39.00)
LDL Cholesterol: 109 mg/dL — ABNORMAL HIGH (ref 0–99)
NonHDL: 133.58
Total CHOL/HDL Ratio: 3
Triglycerides: 122 mg/dL (ref 0.0–149.0)
VLDL: 24.4 mg/dL (ref 0.0–40.0)

## 2019-07-14 LAB — LDL CHOLESTEROL, DIRECT: Direct LDL: 92 mg/dL

## 2019-07-14 LAB — TSH: TSH: 2.1 u[IU]/mL (ref 0.35–4.50)

## 2019-07-14 MED ORDER — ALPRAZOLAM 0.25 MG PO TABS: 0.2500 mg | ORAL_TABLET | Freq: Every evening | ORAL | 0 refills | Status: DC | PRN

## 2019-07-14 MED ORDER — ESCITALOPRAM OXALATE 10 MG PO TABS: ORAL_TABLET | ORAL | 1 refills | Status: DC

## 2019-07-14 NOTE — Patient Instructions (Signed)
Managing Your Hypertension Hypertension is commonly called high blood pressure. This is when the force of your blood pressing against the walls of your arteries is too strong. Arteries are blood vessels that carry blood from your heart throughout your body. Hypertension forces the heart to work harder to pump blood, and may cause the arteries to become narrow or stiff. Having untreated or uncontrolled hypertension can cause heart attack, stroke, kidney disease, and other problems. What are blood pressure readings? A blood pressure reading consists of a higher number over a lower number. Ideally, your blood pressure should be below 120/80. The first ("top") number is called the systolic pressure. It is a measure of the pressure in your arteries as your heart beats. The second ("bottom") number is called the diastolic pressure. It is a measure of the pressure in your arteries as the heart relaxes. What does my blood pressure reading mean? Blood pressure is classified into four stages. Based on your blood pressure reading, your health care provider may use the following stages to determine what type of treatment you need, if any. Systolic pressure and diastolic pressure are measured in a unit called mm Hg. Normal  Systolic pressure: below 120.  Diastolic pressure: below 80. Elevated  Systolic pressure: 120-129.  Diastolic pressure: below 80. Hypertension stage 1  Systolic pressure: 130-139.  Diastolic pressure: 80-89. Hypertension stage 2  Systolic pressure: 140 or above.  Diastolic pressure: 90 or above. What health risks are associated with hypertension? Managing your hypertension is an important responsibility. Uncontrolled hypertension can lead to:  A heart attack.  A stroke.  A weakened blood vessel (aneurysm).  Heart failure.  Kidney damage.  Eye damage.  Metabolic syndrome.  Memory and concentration problems. What changes can I make to manage my  hypertension? Hypertension can be managed by making lifestyle changes and possibly by taking medicines. Your health care provider will help you make a plan to bring your blood pressure within a normal range. Eating and drinking   Eat a diet that is high in fiber and potassium, and low in salt (sodium), added sugar, and fat. An example eating plan is called the DASH (Dietary Approaches to Stop Hypertension) diet. To eat this way: ? Eat plenty of fresh fruits and vegetables. Try to fill half of your plate at each meal with fruits and vegetables. ? Eat whole grains, such as whole wheat pasta, brown rice, or whole grain bread. Fill about one quarter of your plate with whole grains. ? Eat low-fat diary products. ? Avoid fatty cuts of meat, processed or cured meats, and poultry with skin. Fill about one quarter of your plate with lean proteins such as fish, chicken without skin, beans, eggs, and tofu. ? Avoid premade and processed foods. These tend to be higher in sodium, added sugar, and fat.  Reduce your daily sodium intake. Most people with hypertension should eat less than 1,500 mg of sodium a day.  Limit alcohol intake to no more than 1 drink a day for nonpregnant women and 2 drinks a day for men. One drink equals 12 oz of beer, 5 oz of wine, or 1 oz of hard liquor. Lifestyle  Work with your health care provider to maintain a healthy body weight, or to lose weight. Ask what an ideal weight is for you.  Get at least 30 minutes of exercise that causes your heart to beat faster (aerobic exercise) most days of the week. Activities may include walking, swimming, or biking.  Include exercise   to strengthen your muscles (resistance exercise), such as weight lifting, as part of your weekly exercise routine. Try to do these types of exercises for 30 minutes at least 3 days a week.  Do not use any products that contain nicotine or tobacco, such as cigarettes and e-cigarettes. If you need help quitting,  ask your health care provider.  Control any long-term (chronic) conditions you have, such as high cholesterol or diabetes. Monitoring  Monitor your blood pressure at home as told by your health care provider. Your personal target blood pressure may vary depending on your medical conditions, your age, and other factors.  Have your blood pressure checked regularly, as often as told by your health care provider. Working with your health care provider  Review all the medicines you take with your health care provider because there may be side effects or interactions.  Talk with your health care provider about your diet, exercise habits, and other lifestyle factors that may be contributing to hypertension.  Visit your health care provider regularly. Your health care provider can help you create and adjust your plan for managing hypertension. Will I need medicine to control my blood pressure? Your health care provider may prescribe medicine if lifestyle changes are not enough to get your blood pressure under control, and if:  Your systolic blood pressure is 130 or higher.  Your diastolic blood pressure is 80 or higher. Take medicines only as told by your health care provider. Follow the directions carefully. Blood pressure medicines must be taken as prescribed. The medicine does not work as well when you skip doses. Skipping doses also puts you at risk for problems. Contact a health care provider if:  You think you are having a reaction to medicines you have taken.  You have repeated (recurrent) headaches.  You feel dizzy.  You have swelling in your ankles.  You have trouble with your vision. Get help right away if:  You develop a severe headache or confusion.  You have unusual weakness or numbness, or you feel faint.  You have severe pain in your chest or abdomen.  You vomit repeatedly.  You have trouble breathing. Summary  Hypertension is when the force of blood pumping  through your arteries is too strong. If this condition is not controlled, it may put you at risk for serious complications.  Your personal target blood pressure may vary depending on your medical conditions, your age, and other factors. For most people, a normal blood pressure is less than 120/80.  Hypertension is managed by lifestyle changes, medicines, or both. Lifestyle changes include weight loss, eating a healthy, low-sodium diet, exercising more, and limiting alcohol. This information is not intended to replace advice given to you by your health care provider. Make sure you discuss any questions you have with your health care provider. Document Revised: 06/26/2018 Document Reviewed: 01/31/2016 Elsevier Patient Education  Friend Maintenance After Age 56 After age 45, you are at a higher risk for certain long-term diseases and infections as well as injuries from falls. Falls are a major cause of broken bones and head injuries in people who are older than age 66. Getting regular preventive care can help to keep you healthy and well. Preventive care includes getting regular testing and making lifestyle changes as recommended by your health care provider. Talk with your health care provider about:  Which screenings and tests you should have. A screening is a test that checks for a disease when you have  no symptoms.  A diet and exercise plan that is right for you. What should I know about screenings and tests to prevent falls? Screening and testing are the best ways to find a health problem early. Early diagnosis and treatment give you the best chance of managing medical conditions that are common after age 33. Certain conditions and lifestyle choices may make you more likely to have a fall. Your health care provider may recommend:  Regular vision checks. Poor vision and conditions such as cataracts can make you more likely to have a fall. If you wear glasses, make sure to get  your prescription updated if your vision changes.  Medicine review. Work with your health care provider to regularly review all of the medicines you are taking, including over-the-counter medicines. Ask your health care provider about any side effects that may make you more likely to have a fall. Tell your health care provider if any medicines that you take make you feel dizzy or sleepy.  Osteoporosis screening. Osteoporosis is a condition that causes the bones to get weaker. This can make the bones weak and cause them to break more easily.  Blood pressure screening. Blood pressure changes and medicines to control blood pressure can make you feel dizzy.  Strength and balance checks. Your health care provider may recommend certain tests to check your strength and balance while standing, walking, or changing positions.  Foot health exam. Foot pain and numbness, as well as not wearing proper footwear, can make you more likely to have a fall.  Depression screening. You may be more likely to have a fall if you have a fear of falling, feel emotionally low, or feel unable to do activities that you used to do.  Alcohol use screening. Using too much alcohol can affect your balance and may make you more likely to have a fall. What actions can I take to lower my risk of falls? General instructions  Talk with your health care provider about your risks for falling. Tell your health care provider if: ? You fall. Be sure to tell your health care provider about all falls, even ones that seem minor. ? You feel dizzy, sleepy, or off-balance.  Take over-the-counter and prescription medicines only as told by your health care provider. These include any supplements.  Eat a healthy diet and maintain a healthy weight. A healthy diet includes low-fat dairy products, low-fat (lean) meats, and fiber from whole grains, beans, and lots of fruits and vegetables. Home safety  Remove any tripping hazards, such as rugs,  cords, and clutter.  Install safety equipment such as grab bars in bathrooms and safety rails on stairs.  Keep rooms and walkways well-lit. Activity   Follow a regular exercise program to stay fit. This will help you maintain your balance. Ask your health care provider what types of exercise are appropriate for you.  If you need a cane or walker, use it as recommended by your health care provider.  Wear supportive shoes that have nonskid soles. Lifestyle  Do not drink alcohol if your health care provider tells you not to drink.  If you drink alcohol, limit how much you have: ? 0-1 drink a day for women. ? 0-2 drinks a day for men.  Be aware of how much alcohol is in your drink. In the [place] one drink equals one typical bottle of beer (12 oz), one-half glass of wine (5 oz), or one shot of hard liquor (1 oz).  Do not use any  products that contain nicotine or tobacco, such as cigarettes and e-cigarettes. If you need help quitting, ask your health care provider. Summary  Having a healthy lifestyle and getting preventive care can help to protect your health and wellness after age 3.  Screening and testing are the best way to find a health problem early and help you avoid having a fall. Early diagnosis and treatment give you the best chance for managing medical conditions that are more common for people who are older than age 65.  Falls are a major cause of broken bones and head injuries in people who are older than age 78. Take precautions to prevent a fall at home.  Work with your health care provider to learn what changes you can make to improve your health and wellness and to prevent falls. This information is not intended to replace advice given to you by your health care provider. Make sure you discuss any questions you have with your health care provider. Document Revised: 06/25/2018 Document Reviewed: 01/15/2017 Elsevier Patient Education  2020 [place]  65 Years and Older, Female Preventive care refers to lifestyle choices and visits with your health care provider that can promote health and wellness. This includes:  A yearly physical exam. This is also called an annual well check.  Regular dental and eye exams.  Immunizations.  Screening for certain conditions.  Healthy lifestyle choices, such as diet and exercise. What can I expect for my preventive care visit? Physical exam Your health care provider will check:  Height and weight. These may be used to calculate body mass index (BMI), which is a measurement that tells if you are at a healthy weight.  Heart rate and blood pressure.  Your skin for abnormal spots. Counseling Your health care provider may ask you questions about:  Alcohol, tobacco, and drug use.  Emotional well-being.  Home and relationship well-being.  Sexual activity.  Eating habits.  History of falls.  Memory and ability to understand (cognition).  Work and work Statistician.  Pregnancy and menstrual history. What immunizations do I need?  Influenza (flu) vaccine  This is recommended every year. Tetanus, diphtheria, and pertussis (Tdap) vaccine  You may need a Td booster every 10 years. Varicella (chickenpox) vaccine  You may need this vaccine if you have not already been vaccinated. Zoster (shingles) vaccine  You may need this after age 62. Pneumococcal conjugate (PCV13) vaccine  One dose is recommended after age 58. Pneumococcal polysaccharide (PPSV23) vaccine  One dose is recommended after age 66. Measles, mumps, and rubella (MMR) vaccine  You may need at least one dose of MMR if you were born in 1957 or later. You may also need a second dose. Meningococcal conjugate (MenACWY) vaccine  You may need this if you have certain conditions. Hepatitis A vaccine  You may need this if you have certain conditions or if you travel or work in places where you may be exposed to hepatitis  A. Hepatitis B vaccine  You may need this if you have certain conditions or if you travel or work in places where you may be exposed to hepatitis B. Haemophilus influenzae type b (Hib) vaccine  You may need this if you have certain conditions. You may receive vaccines as individual doses or as more than one vaccine together in one shot (combination vaccines). Talk with your health care provider about the risks and benefits of combination vaccines. What tests do I need? Blood tests  Lipid and cholesterol  levels. These may be checked every 5 years, or more frequently depending on your overall health.  Hepatitis C test.  Hepatitis B test. Screening  Lung cancer screening. You may have this screening every year starting at age 21 if you have a 30-pack-year history of smoking and currently smoke or have quit within the past 15 years.  Colorectal cancer screening. All adults should have this screening starting at age 53 and continuing until age 25. Your health care provider may recommend screening at age 68 if you are at increased risk. You will have tests every 1-10 years, depending on your results and the type of screening test.  Diabetes screening. This is done by checking your blood sugar (glucose) after you have not eaten for a while (fasting). You may have this done every 1-3 years.  Mammogram. This may be done every 1-2 years. Talk with your health care provider about how often you should have regular mammograms.  BRCA-related cancer screening. This may be done if you have a family history of breast, ovarian, tubal, or peritoneal cancers. Other tests  Sexually transmitted disease (STD) testing.  Bone density scan. This is done to screen for osteoporosis. You may have this done starting at age 69. Follow these instructions at home: Eating and drinking  Eat a diet that includes fresh fruits and vegetables, whole grains, lean protein, and low-fat dairy products. Limit your intake of  foods with high amounts of sugar, saturated fats, and salt.  Take vitamin and mineral supplements as recommended by your health care provider.  Do not drink alcohol if your health care provider tells you not to drink.  If you drink alcohol: ? Limit how much you have to 0-1 drink a day. ? Be aware of how much alcohol is in your drink. In the [place] one drink equals one 12 oz bottle of beer (355 mL), one 5 oz glass of wine (148 mL), or one 1 oz glass of hard liquor (44 mL). Lifestyle  Take daily care of your teeth and gums.  Stay active. Exercise for at least 30 minutes on 5 or more days each week.  Do not use any products that contain nicotine or tobacco, such as cigarettes, e-cigarettes, and chewing tobacco. If you need help quitting, ask your health care provider.  If you are sexually active, practice safe sex. Use a condom or other form of protection in order to prevent STIs (sexually transmitted infections).  Talk with your health care provider about taking a low-dose aspirin or statin. What's next?  Go to your health care provider once a year for a well check visit.  Ask your health care provider how often you should have your eyes and teeth checked.  Stay up to date on all vaccines. This information is not intended to replace advice given to you by your health care provider. Make sure you discuss any questions you have with your health care provider. Document Revised: 02/26/2018 Document Reviewed: 02/26/2018 Elsevier Patient Education  2020 [employer].

## 2019-07-14 NOTE — Progress Notes (Signed)
Established Patient Office Visit  Subjective:  Patient ID: Lori Allison, female    DOB: [birth year]  Age: 74 y.o. MRN: [id number]  CC:  Chief Complaint  Patient presents with  . Follow-up    follow up on medications, no concerns. states that her knees still hurt sometimes.     HPI Lori Allison presents for follow-up of her hypertension, elevated cholesterol, peripheral vascular disease, anxiety, left knee pain.  Patient reports her blood pressure at home is running in the 120s over 80s.  She has been taking Lexapro for her anxiety and says that it is much improved.  She is using less of the Xanax.  Still afraid to go out around large crowds and I encouraged her to continue to avoid crowds.  She has had her Covid vaccine.  Left knee continues to bother her.  She was reluctant to use the Voltaren gel after she read side effects.  She asks about tramadol.  Cannot combine tramadol and Xanax.  Continues to avoid having her DEXA scan and mammogram done.  Past Medical History:  Diagnosis Date  . Chicken pox   . Hay fever   . UTI (urinary tract infection)     Past Surgical History:  Procedure Laterality Date  . EYE SURGERY Bilateral 07/17/2018    Family History  Problem Relation Age of Onset  . High blood pressure Mother   . Heart disease Father   . High blood pressure Sister   . Early death Brother     Social History   Socioeconomic History  . Marital status: Divorced    Spouse name: Not on file  . Number of children: Not on file  . Years of education: Not on file  . Highest education level: Not on file  Occupational History  . Not on file  Tobacco Use  . Smoking status: Former Smoker    Types: Cigarettes    Quit date: 2008    Years since quitting: 13.3  . Smokeless tobacco: Former Network engineer and Sexual Activity  . Alcohol use: Not Currently  . Drug use: Never  . Sexual activity: Not on file  Other Topics Concern  . Not on file  Social History Narrative  . Not  on file   Social Determinants of Health   Financial Resource Strain: Low Risk   . Difficulty of Paying Living Expenses: Not hard at all  Food Insecurity: No Food Insecurity  . Worried About Charity fundraiser in the Last Year: Never true  . Ran Out of Food in the Last Year: Never true  Transportation Needs: No Transportation Needs  . Lack of Transportation (Medical): No  . Lack of Transportation (Non-Medical): No  Physical Activity:   . Days of Exercise per Week:   . Minutes of Exercise per Session:   Stress:   . Feeling of Stress :   Social Connections:   . Frequency of Communication with Friends and Family:   . Frequency of Social Gatherings with Friends and Family:   . Attends Religious Services:   . Active Member of Clubs or Organizations:   . Attends Archivist Meetings:   Marland Kitchen Marital Status:   Intimate Partner Violence:   . Fear of Current or Ex-Partner:   . Emotionally Abused:   Marland Kitchen Physically Abused:   . Sexually Abused:     Outpatient Medications Prior to Visit  Medication Sig Dispense Refill  . aspirin EC 81 MG tablet Take 1 tablet (  81 mg total) by mouth daily. 365 tablet 1  . azelastine (ASTELIN) 0.1 % nasal spray Place 1 spray into both nostrils 2 (two) times daily. As needed for post nasal drip 30 mL 12  . Calcium Carbonate-Vitamin D (OSCAL 500/200 D-3 PO) Take 1 tablet by mouth daily.    . metoprolol succinate (TOPROL-XL) 25 MG 24 hr tablet Take 1 tablet (25 mg total) by mouth daily. 90 tablet 2  . Multiple Vitamin (MULTIVITAMIN) tablet Take 1 tablet by mouth daily.    Marland Kitchen omega-3 acid ethyl esters (LOVAZA) 1 g capsule Take by mouth daily.    . simvastatin (ZOCOR) 20 MG tablet TAKE 1 TABLET(20 MG) BY MOUTH AT BEDTIME 90 tablet 1  . ALPRAZolam (XANAX) 0.25 MG tablet Take 1 tablet (0.25 mg total) by mouth at bedtime as needed for anxiety. 30 tablet 0  . escitalopram (LEXAPRO) 10 MG tablet TAKE 1 TABLET BY MOUTH EVERY DAY 30 tablet 0  . diclofenac sodium  (VOLTAREN) 1 % GEL Apply 2 g topically 3 (three) times daily as needed. (Patient not taking: Reported on 07/14/2019) 100 g 1   No facility-administered medications prior to visit.    No Known Allergies  ROS Review of Systems  Constitutional: Negative.   HENT: Negative.   Eyes: Negative for photophobia and visual disturbance.  Respiratory: Negative.   Cardiovascular: Negative.   Gastrointestinal: Negative.   Endocrine: Negative for polyphagia and polyuria.  Genitourinary: Negative.   Musculoskeletal: Positive for arthralgias. Negative for gait problem.  Allergic/Immunologic: Negative for immunocompromised state.  Neurological: Negative for dizziness and light-headedness.  Hematological: Does not bruise/bleed easily.  Psychiatric/Behavioral: Negative.       Objective:    Physical Exam  Constitutional: She is oriented to person, place, and time. She appears well-developed and well-nourished. No distress.  HENT:  Head: Normocephalic and atraumatic.  Right Ear: External ear normal.  Left Ear: External ear normal.  Eyes: Conjunctivae are normal. Right eye exhibits no discharge. Left eye exhibits no discharge. No scleral icterus.  Neck: No JVD present. No tracheal deviation present. No thyromegaly present.  Cardiovascular: Normal rate, regular rhythm and normal heart sounds.  Pulmonary/Chest: Effort normal and breath sounds normal. No stridor.  Abdominal: Bowel sounds are normal.  Musculoskeletal:        General: No edema.  Lymphadenopathy:    She has no cervical adenopathy.  Neurological: She is alert and oriented to person, place, and time.  Skin: Skin is warm and dry. She is not diaphoretic.  Psychiatric: She has a normal mood and affect. Her behavior is normal.    BP (!) 150/76   Pulse 70   Temp 97.8 F (36.6 C) (Tympanic)   Ht 5\' 3"  (1.6 m)   Wt 128 lb 12.8 oz (58.4 kg)   SpO2 98%   BMI 22.82 kg/m  Wt Readings from Last 3 Encounters:  07/14/19 128 lb 12.8 oz  (58.4 kg)  03/01/19 119 lb (54 kg)  12/28/18 119 lb 6 oz (54.1 kg)     Health Maintenance Due  Topic Date Due  . Hepatitis C Screening  Never done  . TETANUS/TDAP  Never done  . PNA vac Low Risk Adult (2 of 2 - PCV13) 05/24/2019    There are no preventive care reminders to display for this patient.  No results found for: TSH Lab Results  Component Value Date   WBC 10.1 10/19/2018   HGB 13.1 10/19/2018   HCT 40.4 10/19/2018   MCV 91.0 10/19/2018  PLT 314.0 10/19/2018   Lab Results  Component Value Date   NA 137 10/19/2018   K 4.6 10/19/2018   CO2 27 10/19/2018   GLUCOSE 99 10/19/2018   BUN 12 10/19/2018   CREATININE 0.61 10/19/2018   BILITOT 0.4 10/19/2018   ALKPHOS 192 (H) 10/19/2018   AST 32 10/19/2018   ALT 29 10/19/2018   PROT 7.1 10/19/2018   ALBUMIN 4.1 10/19/2018   CALCIUM 9.4 10/19/2018   GFR 96.07 10/19/2018   Lab Results  Component Value Date   CHOL 164 10/19/2018   Lab Results  Component Value Date   HDL 63.40 10/19/2018   Lab Results  Component Value Date   LDLCALC 79 10/19/2018   Lab Results  Component Value Date   TRIG 107.0 10/19/2018   Lab Results  Component Value Date   CHOLHDL 3 10/19/2018   No results found for: HGBA1C    Assessment & Plan:   Problem List Items Addressed This Visit      Cardiovascular and Mediastinum   Essential hypertension   Relevant Orders   CBC   Basic metabolic panel   Hepatic function panel   Urinalysis, Routine w reflex microscopic   PVD (peripheral vascular disease) (HCC)   Relevant Orders   LDL cholesterol, direct   Lipid panel     Other   Elevated LDL cholesterol level   Relevant Orders   LDL cholesterol, direct   Lipid panel   Chronic pain of left knee   Relevant Medications   escitalopram (LEXAPRO) 10 MG tablet   Other Relevant Orders   Ambulatory referral to Sports Medicine   Anxiety - Primary   Relevant Medications   ALPRAZolam (XANAX) 0.25 MG tablet   escitalopram (LEXAPRO)  10 MG tablet   Abnormal TSH   Relevant Orders   TSH    Other Visit Diagnoses    Healthcare maintenance       Relevant Orders   DG Bone Density   MM Digital Screening      Meds ordered this encounter  Medications  . ALPRAZolam (XANAX) 0.25 MG tablet    Sig: Take 1 tablet (0.25 mg total) by mouth at bedtime as needed for anxiety.    Dispense:  30 tablet    Refill:  0  . escitalopram (LEXAPRO) 10 MG tablet    Sig: TAKE 1 TABLET BY MOUTH EVERY DAY    Dispense:  90 tablet    Refill:  1    Follow-up: Return in about 3 months (around 10/13/2019), or check and record blood pressures..   Patient was given information on managing high blood pressure.  She was asked to check and record her blood pressures over the next 3 months and follow-up with her blood pressure cuff.  Encouraged her to use the Voltaren gel for her knee pain. Mliss Sax, MD

## 2019-07-15 ENCOUNTER — Other Ambulatory Visit: Payer: Self-pay | Admitting: Family Medicine

## 2019-07-15 DIAGNOSIS — F419 Anxiety disorder, unspecified: Secondary | ICD-10-CM

## 2019-07-15 LAB — CBC
HCT: 44.1 % (ref 36.0–46.0)
Hemoglobin: 14.7 g/dL (ref 12.0–15.0)
MCHC: 33.4 g/dL (ref 30.0–36.0)
MCV: 91.9 fl (ref 78.0–100.0)
Platelets: 293 10*3/uL (ref 150.0–400.0)
RBC: 4.8 Mil/uL (ref 3.87–5.11)
RDW: 13.8 % (ref 11.5–15.5)
WBC: 9.5 10*3/uL (ref 4.0–10.5)

## 2019-07-19 ENCOUNTER — Ambulatory Visit: Payer: Self-pay

## 2019-07-19 ENCOUNTER — Encounter: Payer: Self-pay | Admitting: Family Medicine

## 2019-07-19 ENCOUNTER — Ambulatory Visit (INDEPENDENT_AMBULATORY_CARE_PROVIDER_SITE_OTHER): Payer: Medicare Other | Admitting: Family Medicine

## 2019-07-19 ENCOUNTER — Other Ambulatory Visit: Payer: Self-pay

## 2019-07-19 VITALS — BP 140/74 | HR 74 | Ht 63.0 in | Wt 130.0 lb

## 2019-07-19 DIAGNOSIS — M170 Bilateral primary osteoarthritis of knee: Secondary | ICD-10-CM

## 2019-07-19 DIAGNOSIS — M25562 Pain in left knee: Secondary | ICD-10-CM | POA: Diagnosis not present

## 2019-07-19 DIAGNOSIS — M1710 Unilateral primary osteoarthritis, unspecified knee: Secondary | ICD-10-CM | POA: Insufficient documentation

## 2019-07-19 DIAGNOSIS — G8929 Other chronic pain: Secondary | ICD-10-CM

## 2019-07-19 DIAGNOSIS — M25561 Pain in right knee: Secondary | ICD-10-CM | POA: Diagnosis not present

## 2019-07-19 DIAGNOSIS — M179 Osteoarthritis of knee, unspecified: Secondary | ICD-10-CM | POA: Insufficient documentation

## 2019-07-19 NOTE — Progress Notes (Signed)
I, Christoper Fabian, LAT, ATC, am serving as scribe for Dr. [name].  Lori Allison is a 74 y.o. female who presents to [facility] at [facility] today for B knee pain.  She was last seen by Dr. Katrinka Blazing for L knee pain in October 2019 and was referred to Dr. Jerl Santos at [facility] after reviewing her L knee MRI from 12/31/17.  Since her last visit w/ Dr. Katrinka Blazing, pt reports that she has chronic L knee pain but notes that now her R knee is bothering too.  She denies any significant knee swelling.  She notes clicking in her R knee.  Her B knee pain is worsened w/ prolonged standing and walking and w/ stairs..  Additionally she asks about CBD for knee pain.  She notes that she is tried with the oral and topical versions of CBD and found that the topical version is reasonably helpful without being very obnoxious.  She is asking questions about safety of topical CBD with her medication regimen.  She has Tylenol somewhat helpful as well.  She is tried Voltaren gel and found it to be only mildly helpful.  Diagnostic imaging: L knee MRI- 12/31/17  Pertinent review of systems: No fevers or chills  Relevant historical information: Peripheral vascular disease and carotid artery stenosis   Exam:  BP 140/74 (BP Location: Left Arm, Patient Position: Sitting, Cuff Size: Normal)   Pulse 74   Ht 5\' 3"  (1.6 m)   Wt 130 lb (59 kg)   SpO2 98%   BMI 23.03 kg/m  General: Well Developed, well nourished, and in no acute distress.   MSK: Knees bilaterally with mild effusion.  No significant deformity.  No erythema. Range of motion 0-120 degrees with crepitation.  Stable ligamentous exam.    Lab and Radiology Results  Procedure: Real-time Ultrasound Guided Injection of right knee Device: Philips Affiniti 50G Images permanently stored and available for review in the ultrasound unit. Verbal informed consent obtained.  Discussed risks and benefits of procedure. Warned about infection bleeding  damage to structures skin hypopigmentation and fat atrophy among others. Patient expresses understanding and agreement Time-out conducted.   Noted no overlying erythema, induration, or other signs of local infection.   Skin prepped in a sterile fashion.   Local anesthesia: Topical Ethyl chloride.   With sterile technique and under real time ultrasound guidance:  40 mg of Kenalog and 3 mL of Marcaine injected easily.   Completed without difficulty   Pain immediately resolved suggesting accurate placement of the medication.   Advised to call if fevers/chills, erythema, induration, drainage, or persistent bleeding.   Images permanently stored and available for review in the ultrasound unit.  Impression: Technically successful ultrasound guided injection.     Procedure: Real-time Ultrasound Guided Injection of left knee Device: Philips Affiniti 50G Images permanently stored and available for review in the ultrasound unit. Verbal informed consent obtained.  Discussed risks and benefits of procedure. Warned about infection bleeding damage to structures skin hypopigmentation and fat atrophy among others. Patient expresses understanding and agreement Time-out conducted.   Noted no overlying erythema, induration, or other signs of local infection.   Skin prepped in a sterile fashion.   Local anesthesia: Topical Ethyl chloride.   With sterile technique and under real time ultrasound guidance:  40 mg of Kenalog and 3 mL of Marcaine injected easily.   Completed without difficulty   Pain immediately resolved suggesting accurate placement of the medication.   Advised to  call if fevers/chills, erythema, induration, drainage, or persistent bleeding.   Images permanently stored and available for review in the ultrasound unit.  Impression: Technically successful ultrasound guided injection.      Assessment and Plan: 74 y.o. female with bilateral knee pain due to DJD.  Patient has prior imaging  showing significant degenerative changes.  At this point her best long-term option is total knee replacement but she is reluctant to consider it at this time.  It has been quite a while since he has had steroid injections which we will proceed with today.  Additionally we will start authorizing hyaluronic acid injections as these may provide better lasting results.  Additionally spent time discussing CBD.  I expressed that there is not a lot of high quality evidence for topical CBD for pain.  However she is notes that she has had some benefit from it which is reasonable.  As for safety I think it is reasonably safe to use however advised that she discuss this with her pharmacist as well which would be helpful.   Recheck back with me as needed.  Orders Placed This Encounter  Procedures  . [place] LIMITED JOINT SPACE STRUCTURES LOW BILAT(NO LINKED CHARGES)    Order Specific Question:   Reason for Exam (SYMPTOM  OR DIAGNOSIS REQUIRED)    Answer:   B knee pain    Order Specific Question:   Preferred imaging location?    Answer:   [place]   No orders of the defined types were placed in this encounter.    Discussed warning signs or symptoms. Please see discharge instructions. Patient expresses understanding.   The above documentation has been reviewed and is accurate and complete Lynne Leader

## 2019-07-19 NOTE — Patient Instructions (Addendum)
You had B knee injections today. Call or go to the ER if you develop a large red swollen joint with extreme pain or oozing puss.   Call or go to the ER if you develop a large red swollen joint with extreme pain or oozing puss.   We will work on authorizing the gel shots.   Topical CBD is probably reasonably safe with your medicines.  I recommend that you ask your pharmacist.

## 2019-07-29 ENCOUNTER — Ambulatory Visit (INDEPENDENT_AMBULATORY_CARE_PROVIDER_SITE_OTHER): Payer: Medicare Other | Admitting: Family Medicine

## 2019-07-29 ENCOUNTER — Ambulatory Visit: Payer: Self-pay

## 2019-07-29 ENCOUNTER — Other Ambulatory Visit: Payer: Self-pay

## 2019-07-29 DIAGNOSIS — M170 Bilateral primary osteoarthritis of knee: Secondary | ICD-10-CM

## 2019-07-29 DIAGNOSIS — M25562 Pain in left knee: Secondary | ICD-10-CM | POA: Diagnosis not present

## 2019-07-29 DIAGNOSIS — M25561 Pain in right knee: Secondary | ICD-10-CM

## 2019-07-29 NOTE — Progress Notes (Signed)
Patient presents to clinic today for Gelsyn injection bilateral knees 1/3.  Procedure: Real-time Ultrasound Guided Injection of Gelsyn right knee Device: Philips Affiniti 50G Images permanently stored and available for review in the ultrasound unit. Verbal informed consent obtained.  Discussed risks and benefits of procedure. Warned about infection bleeding damage to structures skin hypopigmentation and fat atrophy among others. Patient expresses understanding and agreement Time-out conducted.   Noted no overlying erythema, induration, or other signs of local infection.   Skin prepped in a sterile fashion.   Local anesthesia: Topical Ethyl chloride.   With sterile technique and under real time ultrasound guidance:  Gelsyn injected easily.   Completed without difficulty      Advised to call if fevers/chills, erythema, induration, drainage, or persistent bleeding.   Images permanently stored and available for review in the ultrasound unit.  Impression: Technically successful ultrasound guided injection.   Lot #[id number]  Procedure: Real-time Ultrasound Guided Injection of Gelsyn left knee Device: Philips Affiniti 50G Images permanently stored and available for review in the ultrasound unit. Verbal informed consent obtained.  Discussed risks and benefits of procedure. Warned about infection bleeding damage to structures skin hypopigmentation and fat atrophy among others. Patient expresses understanding and agreement Time-out conducted.   Noted no overlying erythema, induration, or other signs of local infection.   Skin prepped in a sterile fashion.   Local anesthesia: Topical Ethyl chloride.   With sterile technique and under real time ultrasound guidance:  Gelsyn injected easily.   Completed without difficulty      Advised to call if fevers/chills, erythema, induration, drainage, or persistent bleeding.   Images permanently stored and available for review in the ultrasound unit.   Impression: Technically successful ultrasound guided injection.  Lot #[id number]  Return in 1 week for injection both knees 2/3

## 2019-07-29 NOTE — Patient Instructions (Addendum)
You had B knee injections today. Call or go to the ER if you develop a large red swollen joint with extreme pain or oozing puss.   Recheck for weeks 2 and 3 injections.

## 2019-08-05 ENCOUNTER — Ambulatory Visit (INDEPENDENT_AMBULATORY_CARE_PROVIDER_SITE_OTHER): Payer: Medicare Other | Admitting: Family Medicine

## 2019-08-05 ENCOUNTER — Ambulatory Visit: Payer: Self-pay

## 2019-08-05 ENCOUNTER — Other Ambulatory Visit: Payer: Self-pay

## 2019-08-05 DIAGNOSIS — G8929 Other chronic pain: Secondary | ICD-10-CM

## 2019-08-05 DIAGNOSIS — M170 Bilateral primary osteoarthritis of knee: Secondary | ICD-10-CM

## 2019-08-05 DIAGNOSIS — M25562 Pain in left knee: Secondary | ICD-10-CM

## 2019-08-05 NOTE — Progress Notes (Signed)
Patient presents to clinic today for Gelsyn injection knees bilaterally 2/3  Procedure: Real-time Ultrasound Guided Injection of right knee Device: Philips Affiniti 50G Images permanently stored and available for review in the ultrasound unit. Verbal informed consent obtained.  Discussed risks and benefits of procedure. Warned about infection bleeding damage to structures skin hypopigmentation and fat atrophy among others. Patient expresses understanding and agreement Time-out conducted.   Noted no overlying erythema, induration, or other signs of local infection.   Skin prepped in a sterile fashion.   Local anesthesia: Topical Ethyl chloride.   With sterile technique and under real time ultrasound guidance:  Gelsyn injected easily.   Completed without difficulty    Advised to call if fevers/chills, erythema, induration, drainage, or persistent bleeding.   Images permanently stored and available for review in the ultrasound unit.  Impression: Technically successful ultrasound guided injection.     Procedure: Real-time Ultrasound Guided Injection of left knee Device: Philips Affiniti 50G Images permanently stored and available for review in the ultrasound unit. Verbal informed consent obtained.  Discussed risks and benefits of procedure. Warned about infection bleeding damage to structures skin hypopigmentation and fat atrophy among others. Patient expresses understanding and agreement Time-out conducted.   Noted no overlying erythema, induration, or other signs of local infection.   Skin prepped in a sterile fashion.   Local anesthesia: Topical Ethyl chloride.   With sterile technique and under real time ultrasound guidance:  Gelsyn injected easily.   Completed without difficulty     Advised to call if fevers/chills, erythema, induration, drainage, or persistent bleeding.   Images permanently stored and available for review in the ultrasound unit.  Impression: Technically  successful ultrasound guided injection.    Lot number: [id number] both injections  Return in about 1 week for Gelsyn injection knees bilaterally 3/3

## 2019-08-05 NOTE — Patient Instructions (Signed)
Thank you for coming in today. Call or go to the ER if you develop a large red swollen joint with extreme pain or oozing puss.   Recheck as scheduled in about 1 week for 3rd injection

## 2019-08-17 ENCOUNTER — Ambulatory Visit: Payer: Self-pay

## 2019-08-17 ENCOUNTER — Other Ambulatory Visit: Payer: Self-pay

## 2019-08-17 ENCOUNTER — Ambulatory Visit (INDEPENDENT_AMBULATORY_CARE_PROVIDER_SITE_OTHER): Payer: Medicare Other | Admitting: Family Medicine

## 2019-08-17 DIAGNOSIS — G8929 Other chronic pain: Secondary | ICD-10-CM

## 2019-08-17 DIAGNOSIS — M25562 Pain in left knee: Secondary | ICD-10-CM

## 2019-08-17 DIAGNOSIS — M170 Bilateral primary osteoarthritis of knee: Secondary | ICD-10-CM

## 2019-08-17 NOTE — Progress Notes (Signed)
Patient presents to clinic today for Gelsyn injection knees bilaterally 3/3.  Procedure: Real-time Ultrasound Guided Injection of right knee Device: Philips Affiniti 50G Images permanently stored and available for review in the ultrasound unit. Verbal informed consent obtained.  Discussed risks and benefits of procedure. Warned about infection bleeding damage to structures skin hypopigmentation and fat atrophy among others. Patient expresses understanding and agreement Time-out conducted.   Noted no overlying erythema, induration, or other signs of local infection.   Skin prepped in a sterile fashion.   Local anesthesia: Topical Ethyl chloride.   With sterile technique and under real time ultrasound guidance:  Gelsyn injected easily.   Completed without difficulty     Advised to call if fevers/chills, erythema, induration, drainage, or persistent bleeding.   Images permanently stored and available for review in the ultrasound unit.  Impression: Technically successful ultrasound guided injection.  Lot #[id number]  Procedure: Real-time Ultrasound Guided Injection of left knee Device: Philips Affiniti 50G Images permanently stored and available for review in the ultrasound unit. Verbal informed consent obtained.  Discussed risks and benefits of procedure. Warned about infection bleeding damage to structures skin hypopigmentation and fat atrophy among others. Patient expresses understanding and agreement Time-out conducted.   Noted no overlying erythema, induration, or other signs of local infection.   Skin prepped in a sterile fashion.   Local anesthesia: Topical Ethyl chloride.   With sterile technique and under real time ultrasound guidance:  Gelsyn injected easily.   Completed without difficulty    Advised to call if fevers/chills, erythema, induration, drainage, or persistent bleeding.   Images permanently stored and available for review in the ultrasound unit.  Impression:  Technically successful ultrasound guided injection.  Lot number: [id number]   Recheck as needed in the future for knee pain.

## 2019-08-17 NOTE — Patient Instructions (Signed)
You had B knee injections today.  Call or go to the ER if you develop a large red swollen joint with extreme pain or oozing puss.

## 2019-09-29 ENCOUNTER — Other Ambulatory Visit: Payer: Self-pay | Admitting: Family Medicine

## 2019-09-29 DIAGNOSIS — E7800 Pure hypercholesterolemia, unspecified: Secondary | ICD-10-CM

## 2019-09-29 DIAGNOSIS — I739 Peripheral vascular disease, unspecified: Secondary | ICD-10-CM

## 2019-09-29 DIAGNOSIS — I6523 Occlusion and stenosis of bilateral carotid arteries: Secondary | ICD-10-CM

## 2019-09-29 DIAGNOSIS — I10 Essential (primary) hypertension: Secondary | ICD-10-CM

## 2019-10-12 ENCOUNTER — Other Ambulatory Visit: Payer: Self-pay | Admitting: Family Medicine

## 2019-10-12 DIAGNOSIS — F419 Anxiety disorder, unspecified: Secondary | ICD-10-CM

## 2019-10-13 NOTE — Telephone Encounter (Signed)
Last office visit-07/14/2019 Last refill- 07/14/2019 Next office visit- not scheduled

## 2019-10-28 ENCOUNTER — Ambulatory Visit (HOSPITAL_BASED_OUTPATIENT_CLINIC_OR_DEPARTMENT_OTHER)
Admission: RE | Admit: 2019-10-28 | Discharge: 2019-10-28 | Disposition: A | Discharge status: Home / Self Care | Payer: Medicare Other | Source: Ambulatory Visit | Attending: Family Medicine | Admitting: Family Medicine

## 2019-10-28 ENCOUNTER — Other Ambulatory Visit: Payer: Self-pay

## 2019-10-28 DIAGNOSIS — Z0000 Encounter for general adult medical examination without abnormal findings: Secondary | ICD-10-CM

## 2019-10-28 DIAGNOSIS — Z780 Asymptomatic menopausal state: Secondary | ICD-10-CM | POA: Diagnosis not present

## 2019-10-28 DIAGNOSIS — Z13820 Encounter for screening for osteoporosis: Secondary | ICD-10-CM | POA: Insufficient documentation

## 2019-10-28 DIAGNOSIS — Z87891 Personal history of nicotine dependence: Secondary | ICD-10-CM | POA: Insufficient documentation

## 2019-10-28 DIAGNOSIS — Z1231 Encounter for screening mammogram for malignant neoplasm of breast: Secondary | ICD-10-CM | POA: Insufficient documentation

## 2019-10-29 ENCOUNTER — Other Ambulatory Visit: Payer: Self-pay | Admitting: Family Medicine

## 2019-10-29 DIAGNOSIS — R928 Other abnormal and inconclusive findings on diagnostic imaging of breast: Secondary | ICD-10-CM

## 2019-11-04 ENCOUNTER — Telehealth (INDEPENDENT_AMBULATORY_CARE_PROVIDER_SITE_OTHER): Payer: Medicare Other | Admitting: Family Medicine

## 2019-11-04 ENCOUNTER — Encounter: Payer: Self-pay | Admitting: Family Medicine

## 2019-11-04 VITALS — Ht 63.0 in | Wt 132.0 lb

## 2019-11-04 DIAGNOSIS — F419 Anxiety disorder, unspecified: Secondary | ICD-10-CM

## 2019-11-04 DIAGNOSIS — M810 Age-related osteoporosis without current pathological fracture: Secondary | ICD-10-CM | POA: Insufficient documentation

## 2019-11-04 MED ORDER — ALENDRONATE SODIUM 70 MG PO TABS: 70.0000 mg | ORAL_TABLET | ORAL | 4 refills | Status: DC

## 2019-11-04 MED ORDER — ALPRAZOLAM 0.25 MG PO TABS: 0.2500 mg | ORAL_TABLET | Freq: Every evening | ORAL | 0 refills | Status: DC | PRN
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
## 2019-11-15 ENCOUNTER — Ambulatory Visit
Admission: RE | Admit: 2019-11-15 | Discharge: 2019-11-15 | Disposition: A | Discharge status: Home / Self Care | Payer: Medicare Other | Source: Ambulatory Visit | Attending: Family Medicine | Admitting: Family Medicine

## 2019-11-15 ENCOUNTER — Other Ambulatory Visit: Payer: Self-pay

## 2019-11-15 DIAGNOSIS — R928 Other abnormal and inconclusive findings on diagnostic imaging of breast: Secondary | ICD-10-CM

## 2019-12-28 ENCOUNTER — Other Ambulatory Visit: Payer: Self-pay | Admitting: Family Medicine

## 2019-12-28 DIAGNOSIS — I10 Essential (primary) hypertension: Secondary | ICD-10-CM

## 2019-12-28 DIAGNOSIS — I6523 Occlusion and stenosis of bilateral carotid arteries: Secondary | ICD-10-CM

## 2019-12-28 DIAGNOSIS — I739 Peripheral vascular disease, unspecified: Secondary | ICD-10-CM

## 2020-02-01 ENCOUNTER — Ambulatory Visit: Payer: Medicare Other

## 2020-03-27 ENCOUNTER — Other Ambulatory Visit: Payer: Self-pay | Admitting: Family Medicine

## 2020-03-27 DIAGNOSIS — E7800 Pure hypercholesterolemia, unspecified: Secondary | ICD-10-CM

## 2020-03-27 DIAGNOSIS — I10 Essential (primary) hypertension: Secondary | ICD-10-CM

## 2020-03-27 DIAGNOSIS — I6523 Occlusion and stenosis of bilateral carotid arteries: Secondary | ICD-10-CM

## 2020-03-27 DIAGNOSIS — I739 Peripheral vascular disease, unspecified: Secondary | ICD-10-CM

## 2020-05-08 NOTE — Progress Notes (Signed)
Subjective:   Darleen CrockerJoan I Redondo is a 75 y.o. female who presents for Medicare Annual (Subsequent) preventive examination.  Review of Systems     Cardiac Risk Factors include: advanced age (>5255men, 82>65 women);hypertension;dyslipidemia;sedentary lifestyle     Objective:    Today's Vitals   05/09/20 0902 05/09/20 0907  BP: (!) 146/78   Pulse: 61   Resp: 16   Temp: (!) 97.2 F (36.2 C)   TempSrc: Temporal   SpO2: 99%   Weight: 134 lb 3.2 oz (60.9 kg)   Height: 5\' 3"  (1.6 m)   PainSc:  3    Body mass index is 23.77 kg/m.  Advanced Directives 05/09/2020 05/05/2019  Does Patient Have a Medical Advance Directive? No No  Would patient like information on creating a medical advance directive? Yes (MAU/Ambulatory/Procedural Areas - Information given) No - Patient declined    Current Medications (verified) Outpatient Encounter Medications as of 05/09/2020  Medication Sig   alendronate (FOSAMAX) 70 MG tablet Take 1 tablet (70 mg total) by mouth every 7 (seven) days. Take with a full glass of water on an empty stomach.   ALPRAZolam (XANAX) 0.25 MG tablet Take 1 tablet (0.25 mg total) by mouth at bedtime as needed for anxiety. Need appt with pcp for additional refills   aspirin EC 81 MG tablet Take 1 tablet (81 mg total) by mouth daily.   azelastine (ASTELIN) 0.1 % nasal spray Place 1 spray into both nostrils 2 (two) times daily. As needed for post nasal drip   Calcium Carbonate-Vitamin D (OSCAL 500/200 D-3 PO) Take 1 tablet by mouth daily.   diclofenac sodium (VOLTAREN) 1 % GEL Apply 2 g topically 3 (three) times daily as needed.   escitalopram (LEXAPRO) 10 MG tablet TAKE 1 TABLET BY MOUTH EVERY DAY   metoprolol succinate (TOPROL-XL) 25 MG 24 hr tablet TAKE 1 TABLET(25 MG) BY MOUTH DAILY   Multiple Vitamin (MULTIVITAMIN) tablet Take 1 tablet by mouth daily.   omega-3 acid ethyl esters (LOVAZA) 1 g capsule Take by mouth daily.   simvastatin (ZOCOR) 20 MG tablet TAKE 1 TABLET(20 MG)  BY MOUTH AT BEDTIME   No facility-administered encounter medications on file as of 05/09/2020.    Allergies (verified) Patient has no known allergies.   History: Past Medical History:  Diagnosis Date   Chicken pox    Hay fever    Hyperlipidemia    Hypertension    UTI (urinary tract infection)    Past Surgical History:  Procedure Laterality Date   EYE SURGERY Bilateral 07/17/2018   Family History  Problem Relation Age of Onset   High blood pressure Mother    Breast cancer Mother    Heart disease Father    High blood pressure Sister    Early death Brother    Social History   Socioeconomic History   Marital status: Divorced    Spouse name: Not on file   Number of children: Not on file   Years of education: Not on file   Highest education level: Not on file  Occupational History   Occupation: retired  Tobacco Use   Smoking status: Former Smoker    Types: Cigarettes    Quit date: 2008    Years since quitting: 14.1   Smokeless tobacco: Former Engineer, waterUser  Substance and Sexual Activity   Alcohol use: Not Currently   Drug use: Never   Sexual activity: Not on file  Other Topics Concern   Not on file  Social History Narrative  Not on file   Social Determinants of Health   Financial Resource Strain: Low Risk    Difficulty of Paying Living Expenses: Not hard at all  Food Insecurity: No Food Insecurity   Worried About Programme researcher, broadcasting/film/video in the Last Year: Never true   Ran Out of Food in the Last Year: Never true  Transportation Needs: No Transportation Needs   Lack of Transportation (Medical): No   Lack of Transportation (Non-Medical): No  Physical Activity: Inactive   Days of Exercise per Week: 0 days   Minutes of Exercise per Session: 0 min  Stress: No Stress Concern Present   Feeling of Stress : Not at all  Social Connections: Moderately Isolated   Frequency of Communication with Friends and Family: More than three times a week    Frequency of Social Gatherings with Friends and Family: More than three times a week   Attends Religious Services: More than 4 times per year   Active Member of [employer] or Organizations: No   Attends Engineer, structural: Never   Marital Status: Divorced    Tobacco Counseling Counseling given: Not Answered   Clinical Intake:  Pre-visit preparation completed: Yes  Pain : 0-10 Pain Score: 3  Pain Type: Chronic pain Pain Location: Knee Pain Onset: More than a month ago Pain Frequency: Constant     Nutritional Status: BMI of 19-24  Normal Nutritional Risks: None Diabetes: No  How often do you need to have someone help you when you read instructions, pamphlets, or other written materials from your doctor or pharmacy?: 1 - Never  Diabetic?No  Interpreter Needed?: No  Information entered by :: Thomasenia Sales LPN   Activities of Daily Living In your present state of health, do you have any difficulty performing the following activities: 05/09/2020  Hearing? N  Vision? N  Difficulty concentrating or making decisions? N  Walking or climbing stairs? N  Dressing or bathing? N  Doing errands, shopping? N  Preparing Food and eating ? N  Using the Toilet? N  In the past six months, have you accidently leaked urine? N  Do you have problems with loss of bowel control? N  Managing your Medications? N  Managing your Finances? N  Housekeeping or managing your Housekeeping? N  Some recent data might be hidden    Patient Care Team: Mliss Sax, MD as PCP - General (Family Medicine)  Indicate any recent Medical Services you may have received from other than [facility] providers in the past year (date may be approximate).     Assessment:   This is a routine wellness examination for Ruchy.  Hearing/Vision screen  Hearing Screening   125Hz  250Hz  500Hz  1000Hz  2000Hz  3000Hz  4000Hz  6000Hz  8000Hz   Right ear:           Left ear:           Comments: No  issues  Vision Screening Comments: Glasses for reading & driving Last eye . [place]  Dietary issues and exercise activities discussed: Current Exercise Habits: The patient does not participate in regular exercise at present, Exercise limited by: None identified  Goals     Increase physical activity     Patient Stated     Lose weight      Depression Screen PHQ 2/9 Scores 05/09/2020 07/14/2019 07/14/2019 05/05/2019 03/01/2019  PHQ - 2 Score 0 0 0 1 2  PHQ- 9 Score - 2 - - 4    Fall Risk Fall Risk  05/09/2020  07/14/2019 05/05/2019  Falls in the past year? 0 0 0  Number falls in past yr: 0 - 0  Injury with Fall? 0 - 0  Follow up Falls prevention discussed - Education provided;Falls prevention discussed    FALL RISK PREVENTION PERTAINING TO THE HOME:  Any stairs in or around the home? No  Home free of loose throw rugs in walkways, pet beds, electrical cords, etc? Yes  Adequate lighting in your home to reduce risk of falls? Yes   ASSISTIVE DEVICES UTILIZED TO PREVENT FALLS:  Life alert? No  Use of a cane, walker or w/c? No  Grab bars in the bathroom? Yes  Shower chair or bench in shower? No  Elevated toilet seat or a handicapped toilet? No   TIMED UP AND GO:  Was the test performed? Yes .  Length of time to ambulate 10 feet: 9 sec.   Gait steady and fast without use of assistive device  Cognitive Function:Normal cognitive status assessed by direct observation by this Nurse Health Advisor. No abnormalities found.          Immunizations Immunization History  Administered Date(s) Administered   Fluad Quad(high Dose 65+) 12/28/2018   Influenza, High Dose Seasonal PF 12/28/2013, 12/10/2017   Influenza-Unspecified 11/17/2019   PFIZER(Purple Top)SARS-COV-2 Vaccination 04/24/2019, 05/19/2019, 12/30/2019   Pneumococcal Polysaccharide-23 05/24/2018    TDAP status: Due, Education has been provided regarding the importance of this vaccine. Advised may receive  this vaccine at local pharmacy or Health Dept. Aware to provide a copy of the vaccination record if obtained from local pharmacy or Health Dept. Verbalized acceptance and understanding.  Flu Vaccine status: Up to date  Pneumococcal vaccine status: Up to date per patient  Covid-19 vaccine status: Completed vaccines  Qualifies for Shingles Vaccine? Yes   Zostavax completed No   Shingrix Completed?: No.    Education has been provided regarding the importance of this vaccine. Patient has been advised to call insurance company to determine out of pocket expense if they have not yet received this vaccine. Advised may also receive vaccine at local pharmacy or Health Dept. Verbalized acceptance and understanding.  Screening Tests Health Maintenance  Topic Date Due   Hepatitis C Screening  Never done   TETANUS/TDAP  Never done   COLONOSCOPY (Pts 45-68yrs Insurance coverage will need to be confirmed)  Never done   PNA vac Low Risk Adult (2 of 2 - PCV13) 05/24/2019   MAMMOGRAM  10/27/2021   INFLUENZA VACCINE  Completed   DEXA SCAN  Completed   COVID-19 Vaccine  Completed    Health Maintenance  Health Maintenance Due  Topic Date Due   Hepatitis C Screening  Never done   TETANUS/TDAP  Never done   COLONOSCOPY (Pts 45-37yrs Insurance coverage will need to be confirmed)  Never done   PNA vac Low Risk Adult (2 of 2 - PCV13) 05/24/2019    Colorectal cancer screening: Due-Declined  Mammogram status: Completed Bilateral-10/28/2019. Repeat every year  Bone Density status: Completed 10/28/2019. Results reflect: Bone density results: OSTEOPOROSIS. Repeat every 2 years.  Lung Cancer Screening: (Low Dose CT Chest recommended if Age 13-80 years, 30 pack-year currently smoking OR have quit w/in 15years.) does not qualify.    Additional Screening:  Hepatitis C Screening: does qualify; Patient to discuss with PCP at next office visit  Vision Screening: Recommended annual ophthalmology  exams for early detection of glaucoma and other disorders of the eye. Is the patient up to date with their annual eye  exam?  No  Who is the provider or what is the name of the office in which the patient attends annual eye exams? Dr. Shea Evans Patient plans to make an appt soon  Dental Screening: Recommended annual dental exams for proper oral hygiene  Community Resource Referral / Chronic Care Management: CRR required this visit?  No   CCM required this visit?  No      Plan:     I have personally reviewed and noted the following in the patients chart:    Medical and social history  Use of alcohol, tobacco or illicit drugs   Current medications and supplements  Functional ability and status  Nutritional status  Physical activity  Advanced directives  List of other physicians  Hospitalizations, surgeries, and ER visits in previous 12 months  Vitals  Screenings to include cognitive, depression, and falls  Referrals and appointments  In addition, I have reviewed and discussed with patient certain preventive protocols, quality metrics, and best practice recommendations. A written personalized care plan for preventive services as well as general preventive health recommendations were provided to patient.   Patient to access avs on mychart.  Roanna Raider, LPN   [id number]  Nurse Health Advisor  Nurse Notes: None

## 2020-05-09 ENCOUNTER — Ambulatory Visit (INDEPENDENT_AMBULATORY_CARE_PROVIDER_SITE_OTHER): Payer: Medicare Other

## 2020-05-09 ENCOUNTER — Telehealth: Payer: Self-pay

## 2020-05-09 ENCOUNTER — Other Ambulatory Visit: Payer: Self-pay | Admitting: Family Medicine

## 2020-05-09 ENCOUNTER — Other Ambulatory Visit: Payer: Self-pay

## 2020-05-09 VITALS — BP 146/78 | HR 61 | Temp 97.2°F | Resp 16 | Ht 63.0 in | Wt 134.2 lb

## 2020-05-09 DIAGNOSIS — Z0000 Encounter for general adult medical examination without abnormal findings: Secondary | ICD-10-CM | POA: Diagnosis not present

## 2020-05-09 DIAGNOSIS — F419 Anxiety disorder, unspecified: Secondary | ICD-10-CM

## 2020-05-09 NOTE — Telephone Encounter (Signed)
She needs an appt with one of the providers in office

## 2020-05-09 NOTE — Patient Instructions (Signed)
Lori Allison , Thank you for taking time to come for your Medicare Wellness Visit. I appreciate your ongoing commitment to your health goals. Please review the following plan we discussed and let me know if I can assist you in the future.   Screening recommendations/referrals: Colonoscopy: Due-Declined today. Please call the office to schedule if you change your mind. Mammogram: Completed 11/15/2019-Due-11/14/2020. Bone Density: Completed 10/28/2019-Due 10/27/2021. Recommended yearly ophthalmology/optometry visit for glaucoma screening and checkup Recommended yearly dental visit for hygiene and checkup  Vaccinations: Influenza vaccine: Up to date Pneumococcal vaccine: Per our conversation, you will check with [facility] to see if you have had both vaccines. Tdap vaccine: Discuss with pharmacy Shingles vaccine:   Discuss with pharmacy Covid-19:Completed vaccines  Advanced directives: Information given today  Conditions/risks identified: See problem list  Next appointment: Follow up in one year for your annual wellness visit 05/15/2021 @ 11;15   Preventive Care 65 Years and Older, Female Preventive care refers to lifestyle choices and visits with your health care provider that can promote health and wellness. What does preventive care include?  A yearly physical exam. This is also called an annual well check.  Dental exams once or twice a year.  Routine eye exams. Ask your health care provider how often you should have your eyes checked.  Personal lifestyle choices, including:  Daily care of your teeth and gums.  Regular physical activity.  Eating a healthy diet.  Avoiding tobacco and drug use.  Limiting alcohol use.  Practicing safe sex.  Taking low-dose aspirin every day.  Taking vitamin and mineral supplements as recommended by your health care provider. What happens during an annual well check? The services and screenings done by your health care provider during your  annual well check will depend on your age, overall health, lifestyle risk factors, and family history of disease. Counseling  Your health care provider may ask you questions about your:  Alcohol use.  Tobacco use.  Drug use.  Emotional well-being.  Home and relationship well-being.  Sexual activity.  Eating habits.  History of falls.  Memory and ability to understand (cognition).  Work and work Astronomer.  Reproductive health. Screening  You may have the following tests or measurements:  Height, weight, and BMI.  Blood pressure.  Lipid and cholesterol levels. These may be checked every 5 years, or more frequently if you are over 41 years old.  Skin check.  Lung cancer screening. You may have this screening every year starting at age 75 if you have a 30-pack-year history of smoking and currently smoke or have quit within the past 15 years.  Fecal occult blood test (FOBT) of the stool. You may have this test every year starting at age 100.  Flexible sigmoidoscopy or colonoscopy. You may have a sigmoidoscopy every 5 years or a colonoscopy every 10 years starting at age 30.  Hepatitis C blood test.  Hepatitis B blood test.  Sexually transmitted disease (STD) testing.  Diabetes screening. This is done by checking your blood sugar (glucose) after you have not eaten for a while (fasting). You may have this done every 1-3 years.  Bone density scan. This is done to screen for osteoporosis. You may have this done starting at age 21.  Mammogram. This may be done every 1-2 years. Talk to your health care provider about how often you should have regular mammograms. Talk with your health care provider about your test results, treatment options, and if necessary, the need for more tests. Vaccines  Your health care provider may recommend certain vaccines, such as:  Influenza vaccine. This is recommended every year.  Tetanus, diphtheria, and acellular pertussis (Tdap, Td)  vaccine. You may need a Td booster every 10 years.  Zoster vaccine. You may need this after age 61.  Pneumococcal 13-valent conjugate (PCV13) vaccine. One dose is recommended after age 92.  Pneumococcal polysaccharide (PPSV23) vaccine. One dose is recommended after age 8. Talk to your health care provider about which screenings and vaccines you need and how often you need them. This information is not intended to replace advice given to you by your health care provider. Make sure you discuss any questions you have with your health care provider. Document Released: 03/31/2015 Document Revised: 11/22/2015 Document Reviewed: 01/03/2015 Elsevier Interactive Patient Education  2017 [employer].  Fall Prevention in the Home Falls can cause injuries. They can happen to people of all ages. There are many things you can do to make your home safe and to help prevent falls. What can I do on the outside of my home?  Regularly fix the edges of walkways and driveways and fix any cracks.  Remove anything that might make you trip as you walk through a door, such as a raised step or threshold.  Trim any bushes or trees on the path to your home.  Use bright outdoor lighting.  Clear any walking paths of anything that might make someone trip, such as rocks or tools.  Regularly check to see if handrails are loose or broken. Make sure that both sides of any steps have handrails.  Any raised decks and porches should have guardrails on the edges.  Have any leaves, snow, or ice cleared regularly.  Use sand or salt on walking paths during winter.  Clean up any spills in your garage right away. This includes oil or grease spills. What can I do in the bathroom?  Use night lights.  Install grab bars by the toilet and in the tub and shower. Do not use towel bars as grab bars.  Use non-skid mats or decals in the tub or shower.  If you need to sit down in the shower, use a plastic, non-slip  stool.  Keep the floor dry. Clean up any water that spills on the floor as soon as it happens.  Remove soap buildup in the tub or shower regularly.  Attach bath mats securely with double-sided non-slip rug tape.  Do not have throw rugs and other things on the floor that can make you trip. What can I do in the bedroom?  Use night lights.  Make sure that you have a light by your bed that is easy to reach.  Do not use any sheets or blankets that are too big for your bed. They should not hang down onto the floor.  Have a firm chair that has side arms. You can use this for support while you get dressed.  Do not have throw rugs and other things on the floor that can make you trip. What can I do in the kitchen?  Clean up any spills right away.  Avoid walking on wet floors.  Keep items that you use a lot in easy-to-reach places.  If you need to reach something above you, use a strong step stool that has a grab bar.  Keep electrical cords out of the way.  Do not use floor polish or wax that makes floors slippery. If you must use wax, use non-skid floor wax.  Do  not have throw rugs and other things on the floor that can make you trip. What can I do with my stairs?  Do not leave any items on the stairs.  Make sure that there are handrails on both sides of the stairs and use them. Fix handrails that are broken or loose. Make sure that handrails are as long as the stairways.  Check any carpeting to make sure that it is firmly attached to the stairs. Fix any carpet that is loose or worn.  Avoid having throw rugs at the top or bottom of the stairs. If you do have throw rugs, attach them to the floor with carpet tape.  Make sure that you have a light switch at the top of the stairs and the bottom of the stairs. If you do not have them, ask someone to add them for you. What else can I do to help prevent falls?  Wear shoes that:  Do not have high heels.  Have rubber bottoms.  Are  comfortable and fit you well.  Are closed at the toe. Do not wear sandals.  If you use a stepladder:  Make sure that it is fully opened. Do not climb a closed stepladder.  Make sure that both sides of the stepladder are locked into place.  Ask someone to hold it for you, if possible.  Clearly mark and make sure that you can see:  Any grab bars or handrails.  First and last steps.  Where the edge of each step is.  Use tools that help you move around (mobility aids) if they are needed. These include:  Canes.  Walkers.  Scooters.  Crutches.  Turn on the lights when you go into a dark area. Replace any light bulbs as soon as they burn out.  Set up your furniture so you have a clear path. Avoid moving your furniture around.  If any of your floors are uneven, fix them.  If there are any pets around you, be aware of where they are.  Review your medicines with your doctor. Some medicines can make you feel dizzy. This can increase your chance of falling. Ask your doctor what other things that you can do to help prevent falls. This information is not intended to replace advice given to you by your health care provider. Make sure you discuss any questions you have with your health care provider. Document Released: 12/29/2008 Document Revised: 08/10/2015 Document Reviewed: 04/08/2014 Elsevier Interactive Patient Education  2017 [employer].

## 2020-05-09 NOTE — Telephone Encounter (Signed)
During Medicare Wellness visit, patient requested refills of Xanax & Astelin nasal spray.

## 2020-05-09 NOTE — Telephone Encounter (Signed)
Patient notified

## 2020-05-10 MED ORDER — ALPRAZOLAM 0.25 MG PO TABS: 0.2500 mg | ORAL_TABLET | Freq: Every evening | ORAL | 0 refills | Status: DC | PRN

## 2020-05-10 NOTE — Telephone Encounter (Signed)
Last OV 11/04/19 Last fill 11/04/19  #30/0 Next OV 07/12/20

## 2020-06-15 ENCOUNTER — Ambulatory Visit (INDEPENDENT_AMBULATORY_CARE_PROVIDER_SITE_OTHER): Payer: Medicare Other | Admitting: Family Medicine

## 2020-06-15 ENCOUNTER — Encounter: Payer: Self-pay | Admitting: Family Medicine

## 2020-06-15 ENCOUNTER — Other Ambulatory Visit: Payer: Self-pay

## 2020-06-15 ENCOUNTER — Ambulatory Visit: Payer: Self-pay

## 2020-06-15 VITALS — BP 130/78 | HR 72 | Ht 63.0 in | Wt 133.4 lb

## 2020-06-15 DIAGNOSIS — G8929 Other chronic pain: Secondary | ICD-10-CM

## 2020-06-15 DIAGNOSIS — M25561 Pain in right knee: Secondary | ICD-10-CM

## 2020-06-15 DIAGNOSIS — M25562 Pain in left knee: Secondary | ICD-10-CM

## 2020-06-15 DIAGNOSIS — M170 Bilateral primary osteoarthritis of knee: Secondary | ICD-10-CM | POA: Diagnosis not present

## 2020-06-15 NOTE — Patient Instructions (Addendum)
Thanks for coming in today.  Good to see you.  You had B knee injections today.  Call or go to the ER if you develop a large red swollen joint with extreme pain or oozing puss.   We will work on getting the gel shots authorized through [employer] and someone will call you once we get an answer.  Please schedule a follow-up appt for late April so you can get the gel injection prior to your vacation.

## 2020-06-15 NOTE — Progress Notes (Signed)
I, Christoper Fabian, LAT, ATC, am serving as scribe for Dr. [name].  Lori Allison is a 75 y.o. female who presents to [facility] today for continued chronic bilat knee pain due to OA. Pt was last seen by Dr. Denyse Amass on 08/17/19 and received 3/3 bilat Gelsyn injections. Pt's last bilat knee steroid injections were on 07/19/19. Today, pt reports that her B knees con't to cause pain particularly if she walks for a while or is on her feet for a prolonged period of time.  She leaves for vacation on 07/30/20 and is interested in getting another round of gel injections if possible.  Dx imaging: 12/31/17 L knee MRI  Pertinent review of systems: No fevers or chills  Relevant historical information: Hypertension   Exam:  BP 130/78 (BP Location: Left Arm, Patient Position: Sitting, Cuff Size: Normal)   Pulse 72   Ht 5\' 3"  (1.6 m)   Wt 133 lb 6.4 oz (60.5 kg)   SpO2 98%   BMI 23.63 kg/m  General: Well Developed, well nourished, and in no acute distress.   MSK: Bilateral knees moderate effusion.  Normal motion with crepitation.  Tender palpation medial joint line.    Lab and Radiology Results  Procedure: Real-time Ultrasound Guided Injection of right knee superior lateral patellar space Device: Philips Affiniti 50G Images permanently stored and available for review in PACS Verbal informed consent obtained.  Discussed risks and benefits of procedure. Warned about infection bleeding damage to structures skin hypopigmentation and fat atrophy among others. Patient expresses understanding and agreement Time-out conducted.   Noted no overlying erythema, induration, or other signs of local infection.   Skin prepped in a sterile fashion.   Local anesthesia: Topical Ethyl chloride.   With sterile technique and under real time ultrasound guidance:  40 mg of Kenalog and 2 mL of Marcaine injected into knee joint. Fluid seen entering the joint capsule.   Completed without  difficulty   Pain immediately resolved suggesting accurate placement of the medication.   Advised to call if fevers/chills, erythema, induration, drainage, or persistent bleeding.   Images permanently stored and available for review in the ultrasound unit.  Impression: Technically successful ultrasound guided injection.   Procedure: Real-time Ultrasound Guided Injection of left knee superior lateral patellar space Device: Philips Affiniti 50G Images permanently stored and available for review in PACS Verbal informed consent obtained.  Discussed risks and benefits of procedure. Warned about infection bleeding damage to structures skin hypopigmentation and fat atrophy among others. Patient expresses understanding and agreement Time-out conducted.   Noted no overlying erythema, induration, or other signs of local infection.   Skin prepped in a sterile fashion.   Local anesthesia: Topical Ethyl chloride.   With sterile technique and under real time ultrasound guidance:  40 mg of Kenalog and 2 mL of Marcaine injected into knee joint. Fluid seen entering the joint capsule.   Completed without difficulty   Pain immediately resolved suggesting accurate placement of the medication.   Advised to call if fevers/chills, erythema, induration, drainage, or persistent bleeding.   Images permanently stored and available for review in the ultrasound unit.  Impression: Technically successful ultrasound guided injection.       Assessment and Plan: 75 y.o. female with bilateral knee pain due to DJD.  Plan for steroid injections today.  Based on prior experience we do not expect this to last very long.  We will go ahead and work on authorization now for  single dose hyaluronic acid injection series.  Anticipate proceeding with this in the next month or 2.  We will try to get this done before she leaves for vacation in May.  Recheck sooner if needed.   PDMP not reviewed this encounter. Orders Placed This  Encounter  Procedures  . [place] LIMITED JOINT SPACE STRUCTURES LOW BILAT(NO LINKED CHARGES)    Order Specific Question:   Reason for Exam (SYMPTOM  OR DIAGNOSIS REQUIRED)    Answer:   B knee pain    Order Specific Question:   Preferred imaging location?    Answer:   [facility]   No orders of the defined types were placed in this encounter.    Discussed warning signs or symptoms. Please see discharge instructions. Patient expresses understanding.   The above documentation has been reviewed and is accurate and complete [name], M.D.

## 2020-06-25 ENCOUNTER — Other Ambulatory Visit: Payer: Self-pay | Admitting: Family Medicine

## 2020-06-25 DIAGNOSIS — I10 Essential (primary) hypertension: Secondary | ICD-10-CM

## 2020-06-25 DIAGNOSIS — I6523 Occlusion and stenosis of bilateral carotid arteries: Secondary | ICD-10-CM

## 2020-06-25 DIAGNOSIS — I739 Peripheral vascular disease, unspecified: Secondary | ICD-10-CM

## 2020-07-06 ENCOUNTER — Other Ambulatory Visit: Payer: Self-pay | Admitting: Family Medicine

## 2020-07-06 DIAGNOSIS — F419 Anxiety disorder, unspecified: Secondary | ICD-10-CM

## 2020-07-10 ENCOUNTER — Other Ambulatory Visit: Payer: Self-pay

## 2020-07-10 ENCOUNTER — Ambulatory Visit: Payer: Self-pay

## 2020-07-10 ENCOUNTER — Ambulatory Visit (INDEPENDENT_AMBULATORY_CARE_PROVIDER_SITE_OTHER): Payer: Medicare Other | Admitting: Family Medicine

## 2020-07-10 DIAGNOSIS — M25562 Pain in left knee: Secondary | ICD-10-CM

## 2020-07-10 DIAGNOSIS — M170 Bilateral primary osteoarthritis of knee: Secondary | ICD-10-CM

## 2020-07-10 DIAGNOSIS — M25561 Pain in right knee: Secondary | ICD-10-CM

## 2020-07-10 DIAGNOSIS — G8929 Other chronic pain: Secondary | ICD-10-CM

## 2020-07-10 NOTE — Patient Instructions (Addendum)
You had B knee Durolane injections today.  Call or go to the ER if you develop a large red swollen joint with extreme pain or oozing puss.   Recheck as needed

## 2020-07-10 NOTE — Progress Notes (Signed)
Rozann presents to clinic today for Durolane injection both knees  Procedure: Real-time Ultrasound Guided Injection of right knee superior lateral patellar space Device: Philips Affiniti 50G Images permanently stored and available for review in PACS Verbal informed consent obtained.  Discussed risks and benefits of procedure. Warned about infection bleeding damage to structures skin hypopigmentation and fat atrophy among others. Patient expresses understanding and agreement Time-out conducted.   Noted no overlying erythema, induration, or other signs of local infection.   Skin prepped in a sterile fashion.   Local anesthesia: Topical Ethyl chloride.   With sterile technique and under real time ultrasound guidance:  Durolane 3 mL injected into right knee joint. Fluid seen entering the joint capsule.   Completed without difficulty   Advised to call if fevers/chills, erythema, induration, drainage, or persistent bleeding.   Images permanently stored and available for review in the ultrasound unit.  Impression: Technically successful ultrasound guided injection.  Lot #[id number]  Procedure: Real-time Ultrasound Guided Injection of left knee superior lateral patellar space Device: Philips Affiniti 50G Images permanently stored and available for review in PACS Verbal informed consent obtained.  Discussed risks and benefits of procedure. Warned about infection bleeding damage to structures skin hypopigmentation and fat atrophy among others. Patient expresses understanding and agreement Time-out conducted.   Noted no overlying erythema, induration, or other signs of local infection.   Skin prepped in a sterile fashion.   Local anesthesia: Topical Ethyl chloride.   With sterile technique and under real time ultrasound guidance:  Durolane 3 mL injected into knee joint. Fluid seen entering the joint capsule.   Completed without difficulty   Advised to call if fevers/chills, erythema, induration,  drainage, or persistent bleeding.   Images permanently stored and available for review in the ultrasound unit.  Impression: Technically successful ultrasound guided injection.   Lot #[id number]  Return as needed

## 2020-07-12 ENCOUNTER — Other Ambulatory Visit: Payer: Self-pay

## 2020-07-12 ENCOUNTER — Ambulatory Visit (INDEPENDENT_AMBULATORY_CARE_PROVIDER_SITE_OTHER): Payer: Medicare Other | Admitting: Family Medicine

## 2020-07-12 ENCOUNTER — Encounter: Payer: Self-pay | Admitting: Family Medicine

## 2020-07-12 VITALS — BP 134/70 | HR 66 | Temp 97.0°F | Ht 63.0 in | Wt 128.6 lb

## 2020-07-12 DIAGNOSIS — E7800 Pure hypercholesterolemia, unspecified: Secondary | ICD-10-CM | POA: Diagnosis not present

## 2020-07-12 DIAGNOSIS — F419 Anxiety disorder, unspecified: Secondary | ICD-10-CM

## 2020-07-12 DIAGNOSIS — I739 Peripheral vascular disease, unspecified: Secondary | ICD-10-CM | POA: Diagnosis not present

## 2020-07-12 DIAGNOSIS — R0989 Other specified symptoms and signs involving the circulatory and respiratory systems: Secondary | ICD-10-CM

## 2020-07-12 DIAGNOSIS — I10 Essential (primary) hypertension: Secondary | ICD-10-CM | POA: Diagnosis not present

## 2020-07-12 DIAGNOSIS — I6523 Occlusion and stenosis of bilateral carotid arteries: Secondary | ICD-10-CM | POA: Diagnosis not present

## 2020-07-12 DIAGNOSIS — E559 Vitamin D deficiency, unspecified: Secondary | ICD-10-CM

## 2020-07-12 LAB — CBC
HCT: 46.6 % — ABNORMAL HIGH (ref 36.0–46.0)
Hemoglobin: 15.6 g/dL — ABNORMAL HIGH (ref 12.0–15.0)
MCHC: 33.4 g/dL (ref 30.0–36.0)
MCV: 90.9 fl (ref 78.0–100.0)
Platelets: 292 10*3/uL (ref 150.0–400.0)
RBC: 5.13 Mil/uL — ABNORMAL HIGH (ref 3.87–5.11)
RDW: 13.4 % (ref 11.5–15.5)
WBC: 10.5 10*3/uL (ref 4.0–10.5)

## 2020-07-12 LAB — URINALYSIS, ROUTINE W REFLEX MICROSCOPIC
Bilirubin Urine: NEGATIVE
Ketones, ur: NEGATIVE
Leukocytes,Ua: NEGATIVE
Nitrite: NEGATIVE
RBC / HPF: NONE SEEN (ref 0–?)
Specific Gravity, Urine: <=1.005 — AB (ref 1.000–1.030)
Total Protein, Urine: NEGATIVE
Urine Glucose: NEGATIVE
Urobilinogen, UA: 0.2 (ref 0.0–1.0)
WBC, UA: NONE SEEN (ref 0–?)
pH: 6 (ref 5.0–8.0)

## 2020-07-12 LAB — COMPREHENSIVE METABOLIC PANEL
ALT: 17 U/L (ref 0–35)
AST: 23 U/L (ref 0–37)
Albumin: 4.5 g/dL (ref 3.5–5.2)
Alkaline Phosphatase: 85 U/L (ref 39–117)
BUN: 15 mg/dL (ref 6–23)
CO2: 26 mEq/L (ref 19–32)
Calcium: 9.3 mg/dL (ref 8.4–10.5)
Chloride: 103 mEq/L (ref 96–112)
Creatinine, Ser: 0.73 mg/dL (ref 0.40–1.20)
GFR: 80.68 mL/min (ref >60.00)
Glucose, Bld: 95 mg/dL (ref 70–99)
Potassium: 4.5 mEq/L (ref 3.5–5.1)
Sodium: 139 mEq/L (ref 135–145)
Total Bilirubin: 0.6 mg/dL (ref 0.2–1.2)
Total Protein: 6.8 g/dL (ref 6.0–8.3)

## 2020-07-12 LAB — LIPID PANEL
Cholesterol: 190 mg/dL (ref 0–200)
HDL: 77 mg/dL (ref >39.00)
LDL Cholesterol: 94 mg/dL (ref 0–99)
NonHDL: 112.76
Total CHOL/HDL Ratio: 2
Triglycerides: 94 mg/dL (ref 0.0–149.0)
VLDL: 18.8 mg/dL (ref 0.0–40.0)

## 2020-07-12 LAB — VITAMIN D 25 HYDROXY (VIT D DEFICIENCY, FRACTURES): VITD: 62.51 ng/mL (ref 30.00–100.00)

## 2020-07-12 MED ORDER — ESCITALOPRAM OXALATE 20 MG PO TABS: 20.0000 mg | ORAL_TABLET | Freq: Every day | ORAL | 1 refills | Status: DC

## 2020-07-12 MED ORDER — ALPRAZOLAM 0.5 MG PO TABS: 0.5000 mg | ORAL_TABLET | Freq: Every day | ORAL | 1 refills | Status: DC

## 2020-07-12 NOTE — Progress Notes (Addendum)
Established Patient Office Visit  Subjective:  Patient ID: Lori Allison, female    DOB: [birth year]  Age: 75 y.o. MRN: [id number]  CC:  Chief Complaint  Patient presents with  . Follow-up    Routine follow up, on BP and labs. Patient like Xanax increaced. Patient fasting.     HPI Lori Allison presents for follow-up of hypertension, elevated cholesterol, peripheral vascular disease, anxiety with depression.  Her 13 year old mother recently diagnosed with breast cancer.  She had experienced a cancer in the contralateral breast and recently developed a mass in existing breast.  Daughter's fianc's son recently passed from an apparent fentanyl overdose back in October.  Family is still grieving and has not been able.  Patient admits that the Lexapro has made a big difference in her mood.  Asks if we could go up slightly on the Xanax.  Both of  Past Medical History:  Diagnosis Date  . Chicken pox   . Hay fever   . Hyperlipidemia   . Hypertension   . UTI (urinary tract infection)     Past Surgical History:  Procedure Laterality Date  . EYE SURGERY Bilateral 07/17/2018    Family History  Problem Relation Age of Onset  . High blood pressure Mother   . Breast cancer Mother   . Heart disease Father   . High blood pressure Sister   . Early death Brother     Social History   Socioeconomic History  . Marital status: Divorced    Spouse name: Not on file  . Number of children: Not on file  . Years of education: Not on file  . Highest education level: Not on file  Occupational History  . Occupation: retired  Tobacco Use  . Smoking status: Former Smoker    Types: Cigarettes    Quit date: 2008    Years since quitting: 14.3  . Smokeless tobacco: Former Engineer, water and Sexual Activity  . Alcohol use: Not Currently  . Drug use: Never  . Sexual activity: Not on file  Other Topics Concern  . Not on file  Social History Narrative  . Not on file   Social Determinants of  Health   Financial Resource Strain: Low Risk   . Difficulty of Paying Living Expenses: Not hard at all  Food Insecurity: No Food Insecurity  . Worried About Programme researcher, broadcasting/film/video in the Last Year: Never true  . Ran Out of Food in the Last Year: Never true  Transportation Needs: No Transportation Needs  . Lack of Transportation (Medical): No  . Lack of Transportation (Non-Medical): No  Physical Activity: Inactive  . Days of Exercise per Week: 0 days  . Minutes of Exercise per Session: 0 min  Stress: No Stress Concern Present  . Feeling of Stress : Not at all  Social Connections: Moderately Isolated  . Frequency of Communication with Friends and Family: More than three times a week  . Frequency of Social Gatherings with Friends and Family: More than three times a week  . Attends Religious Services: More than 4 times per year  . Active Member of Clubs or Organizations: No  . Attends Banker Meetings: Never  . Marital Status: Divorced  Catering manager Violence: Not At Risk  . Fear of Current or Ex-Partner: No  . Emotionally Abused: No  . Physically Abused: No  . Sexually Abused: No    Outpatient Medications Prior to Visit  Medication Sig Dispense Refill  .  alendronate (FOSAMAX) 70 MG tablet Take 1 tablet (70 mg total) by mouth every 7 (seven) days. Take with a full glass of water on an empty stomach. 12 tablet 4  . aspirin EC 81 MG tablet Take 1 tablet (81 mg total) by mouth daily. 365 tablet 1  . azelastine (ASTELIN) 0.1 % nasal spray Place 1 spray into both nostrils 2 (two) times daily. As needed for post nasal drip 30 mL 12  . Calcium Carbonate-Vitamin D (OSCAL 500/200 D-3 PO) Take 1 tablet by mouth daily.    . diclofenac sodium (VOLTAREN) 1 % GEL Apply 2 g topically 3 (three) times daily as needed. 100 g 1  . metoprolol succinate (TOPROL-XL) 25 MG 24 hr tablet TAKE 1 TABLET(25 MG) BY MOUTH DAILY 90 tablet 0  . Multiple Vitamin (MULTIVITAMIN) tablet Take 1 tablet by  mouth daily.    Marland Kitchen. omega-3 acid ethyl esters (LOVAZA) 1 g capsule Take by mouth daily.    Marland Kitchen. ALPRAZolam (XANAX) 0.25 MG tablet Take 1 tablet (0.25 mg total) by mouth at bedtime as needed for anxiety. Need appt with pcp for additional refills 30 tablet 0  . escitalopram (LEXAPRO) 10 MG tablet TAKE 1 TABLET BY MOUTH EVERY DAY 30 tablet 0  . simvastatin (ZOCOR) 20 MG tablet TAKE 1 TABLET(20 MG) BY MOUTH AT BEDTIME 90 tablet 1   No facility-administered medications prior to visit.    No Known Allergies  ROS Review of Systems  Constitutional: Negative.   HENT: Negative.   Eyes: Negative for photophobia and visual disturbance.  Respiratory: Negative.   Cardiovascular: Negative.   Gastrointestinal: Negative.   Endocrine: Negative for polyphagia and polyuria.  Genitourinary: Negative.   Musculoskeletal: Negative for gait problem and joint swelling.  Skin: Negative for pallor and rash.  Neurological: Negative for speech difficulty and weakness.  Psychiatric/Behavioral: Positive for dysphoric mood. The patient is nervous/anxious.    Depression screen [facility]HQ 2/9 07/12/2020 07/12/2020 05/09/2020  Decreased Interest 1 0 0  Down, Depressed, Hopeless 1 1 0  PHQ - 2 Score 2 1 0  Altered sleeping 1 - -  Tired, decreased energy 1 - -  Change in appetite 0 - -  Feeling bad or failure about yourself  0 - -  Trouble concentrating 1 - -  Moving slowly or fidgety/restless 0 - -  Suicidal thoughts 0 - -  PHQ-9 Score 5 - -  Difficult doing work/chores Somewhat difficult - -      Objective:    Physical Exam Vitals and nursing note reviewed.  Constitutional:      General: She is not in acute distress.    Appearance: Normal appearance. She is normal weight. She is not ill-appearing, toxic-appearing or diaphoretic.  HENT:     Head: Normocephalic and atraumatic.     Right Ear: Tympanic membrane, ear canal and external ear normal.     Left Ear: Tympanic membrane, ear canal and external ear normal.      Mouth/Throat:     Mouth: Mucous membranes are moist.     Pharynx: Oropharynx is clear. No oropharyngeal exudate or posterior oropharyngeal erythema.  Eyes:     General: No scleral icterus.       Right eye: No discharge.        Left eye: No discharge.     Conjunctiva/sclera: Conjunctivae normal.     Pupils: Pupils are equal, round, and reactive to light.  Neck:     Vascular: Carotid bruit (right, soft.) present.  Cardiovascular:  Rate and Rhythm: Normal rate and regular rhythm.  Pulmonary:     Effort: Pulmonary effort is normal.     Breath sounds: Normal breath sounds.  Abdominal:     General: Bowel sounds are normal.  Musculoskeletal:     Cervical back: No rigidity or tenderness.     Right lower leg: No edema.     Left lower leg: No edema.  Lymphadenopathy:     Cervical: No cervical adenopathy.  Skin:    General: Skin is warm and dry.  Neurological:     Mental Status: She is alert and oriented to person, place, and time.  Psychiatric:        Mood and Affect: Mood normal.        Behavior: Behavior normal.     BP 134/70   Pulse 66   Temp (!) 97 F (36.1 C) (Temporal)   Ht 5\' 3"  (1.6 m)   Wt 128 lb 9.6 oz (58.3 kg)   SpO2 99%   BMI 22.78 kg/m  Wt Readings from Last 3 Encounters:  07/12/20 128 lb 9.6 oz (58.3 kg)  06/15/20 133 lb 6.4 oz (60.5 kg)  05/09/20 134 lb 3.2 oz (60.9 kg)     Health Maintenance Due  Topic Date Due  . Hepatitis C Screening  Never done  . TETANUS/TDAP  Never done  . COLONOSCOPY (Pts 45-74yrs Insurance coverage will need to be confirmed)  Never done  . PNA vac Low Risk Adult (2 of 2 - PCV13) 05/24/2019    There are no preventive care reminders to display for this patient.  Lab Results  Component Value Date   TSH 2.10 07/14/2019   Lab Results  Component Value Date   WBC 10.5 07/12/2020   HGB 15.6 (H) 07/12/2020   HCT 46.6 (H) 07/12/2020   MCV 90.9 07/12/2020   PLT 292.0 07/12/2020   Lab Results  Component Value Date   NA  139 07/12/2020   K 4.5 07/12/2020   CO2 26 07/12/2020   GLUCOSE 95 07/12/2020   BUN 15 07/12/2020   CREATININE 0.73 07/12/2020   BILITOT 0.6 07/12/2020   ALKPHOS 85 07/12/2020   AST 23 07/12/2020   ALT 17 07/12/2020   PROT 6.8 07/12/2020   ALBUMIN 4.5 07/12/2020   CALCIUM 9.3 07/12/2020   GFR 80.68 07/12/2020   Lab Results  Component Value Date   CHOL 190 07/12/2020   Lab Results  Component Value Date   HDL 77.00 07/12/2020   Lab Results  Component Value Date   LDLCALC 94 07/12/2020   Lab Results  Component Value Date   TRIG 94.0 07/12/2020   Lab Results  Component Value Date   CHOLHDL 2 07/12/2020   No results found for: HGBA1C    Assessment & Plan:   Problem List Items Addressed This Visit      Cardiovascular and Mediastinum   Essential hypertension   Relevant Medications   simvastatin (ZOCOR) 40 MG tablet   Other Relevant Orders   CBC (Completed)   Comprehensive metabolic panel (Completed)   Urinalysis, Routine w reflex microscopic (Completed)   PVD (peripheral vascular disease) (HCC)   Relevant Medications   simvastatin (ZOCOR) 40 MG tablet   Other Relevant Orders   Comprehensive metabolic panel (Completed)   Lipid panel (Completed)   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, bilateral   Relevant Medications   simvastatin (ZOCOR) 40 MG tablet   Other Relevant Orders   Lipid panel (Completed)  Other   Bruit of right carotid artery   Relevant Medications   simvastatin (ZOCOR) 40 MG tablet   Other Relevant Orders   Lipid panel (Completed)   Elevated LDL cholesterol level   Relevant Medications   simvastatin (ZOCOR) 40 MG tablet   Other Relevant Orders   Lipid panel (Completed)   Anxiety - Primary   Relevant Medications   ALPRAZolam (XANAX) 0.5 MG tablet   escitalopram (LEXAPRO) 20 MG tablet    Other Visit Diagnoses    Vitamin D deficiency       Relevant Orders   VITAMIN D 25 Hydroxy (Vit-D Deficiency, Fractures)  (Completed)      Meds ordered this encounter  Medications  . ALPRAZolam (XANAX) 0.5 MG tablet    Sig: Take 1 tablet (0.5 mg total) by mouth at bedtime. As needed.    Dispense:  30 tablet    Refill:  1  . escitalopram (LEXAPRO) 20 MG tablet    Sig: Take 1 tablet (20 mg total) by mouth daily.    Dispense:  90 tablet    Refill:  1  . simvastatin (ZOCOR) 40 MG tablet    Sig: Take 1 tablet (40 mg total) by mouth at bedtime.    Dispense:  90 tablet    Refill:  3    Follow-up: Return in about 2 months (around 09/11/2020).    Mliss Sax, MD   4/29 addendum: will increase zocor to 40mg  daily.

## 2020-07-14 ENCOUNTER — Encounter: Payer: Self-pay | Admitting: Family Medicine

## 2020-07-14 MED ORDER — SIMVASTATIN 40 MG PO TABS: 40.0000 mg | ORAL_TABLET | Freq: Every day | ORAL | 3 refills | Status: DC

## 2020-07-14 NOTE — Telephone Encounter (Signed)
You have known blockages in the arteries of your body. With that being said, the recommendations for Ldl cholesterol are 70 or less. We are trying to prevent the progression of your disease. Let me know if you have any significant problems with the higher dose.

## 2020-07-14 NOTE — Addendum Note (Signed)
Addended by: Andrez Grime on: 07/14/2020 09:31 AM   Modules accepted: Orders

## 2020-07-25 ENCOUNTER — Other Ambulatory Visit: Payer: Self-pay

## 2020-07-25 ENCOUNTER — Encounter: Payer: Self-pay | Admitting: Family Medicine

## 2020-07-25 DIAGNOSIS — R0982 Postnasal drip: Secondary | ICD-10-CM

## 2020-07-25 MED ORDER — AZELASTINE HCL 0.1 % NA SOLN: 1.0000 | Freq: Two times a day (BID) | NASAL | 12 refills | Status: DC

## 2020-09-11 ENCOUNTER — Other Ambulatory Visit: Payer: Self-pay

## 2020-09-12 ENCOUNTER — Encounter: Payer: Self-pay | Admitting: Family Medicine

## 2020-09-12 ENCOUNTER — Ambulatory Visit (INDEPENDENT_AMBULATORY_CARE_PROVIDER_SITE_OTHER): Payer: Medicare Other | Admitting: Family Medicine

## 2020-09-12 VITALS — BP 124/70 | HR 60 | Temp 97.4°F | Ht 63.0 in | Wt 130.8 lb

## 2020-09-12 DIAGNOSIS — Z0000 Encounter for general adult medical examination without abnormal findings: Secondary | ICD-10-CM

## 2020-09-12 DIAGNOSIS — M810 Age-related osteoporosis without current pathological fracture: Secondary | ICD-10-CM

## 2020-09-12 DIAGNOSIS — F418 Other specified anxiety disorders: Secondary | ICD-10-CM | POA: Insufficient documentation

## 2020-09-12 DIAGNOSIS — I10 Essential (primary) hypertension: Secondary | ICD-10-CM | POA: Diagnosis not present

## 2020-09-12 DIAGNOSIS — E7800 Pure hypercholesterolemia, unspecified: Secondary | ICD-10-CM

## 2020-09-12 DIAGNOSIS — I739 Peripheral vascular disease, unspecified: Secondary | ICD-10-CM | POA: Diagnosis not present

## 2020-09-12 NOTE — Progress Notes (Signed)
Established Patient Office Visit  Subjective:  Patient ID: Lori Allison, female    DOB: Sep 03, 1945  Age: 75 y.o. MRN: [id number]  CC:  Chief Complaint  Patient presents with   Follow-up    2 month follow up on anxiety and BP, no concerns.     HPI Lori Allison presents for follow-up of anxiety and depression, elevated cholesterol and hypertension.  Blood pressure is controlled on her current regimen.  Stress and anxiety with depression persist.  Mom is status post mastectomies in May.  She is in early stages of dementia.  Patient is sharing care with her sister of their mother.  Mother is declining health and is lying on patient.  Patient is doing okay with the higher dose of Zocor and Lexapro.  Sleeping through the night except to get up once for urination.  She does take her evening pills with dialysis and water.  Fosamax causes temporary flatulence.  Otherwise tolerating the medicine.  Past Medical History:  Diagnosis Date   Chicken pox    Hay fever    Hyperlipidemia    Hypertension    UTI (urinary tract infection)     Past Surgical History:  Procedure Laterality Date   EYE SURGERY Bilateral 07/17/2018    Family History  Problem Relation Age of Onset   High blood pressure Mother    Breast cancer Mother    Heart disease Father    High blood pressure Sister    Early death Brother     Social History   Socioeconomic History   Marital status: Divorced    Spouse name: Not on file   Number of children: Not on file   Years of education: Not on file   Highest education level: Not on file  Occupational History   Occupation: retired  Tobacco Use   Smoking status: Former    Pack years: 0.00    Types: Cigarettes    Quit date: 2008    Years since quitting: 14.4   Smokeless tobacco: Former  Substance and Sexual Activity   Alcohol use: Not Currently   Drug use: Never   Sexual activity: Not on file  Other Topics Concern   Not on file  Social History Narrative   Not on  file   Social Determinants of Health   Financial Resource Strain: Low Risk    Difficulty of Paying Living Expenses: Not hard at all  Food Insecurity: No Food Insecurity   Worried About Programme researcher, broadcasting/film/video in the Last Year: Never true   Barista in the Last Year: Never true  Transportation Needs: No Transportation Needs   Lack of Transportation (Medical): No   Lack of Transportation (Non-Medical): No  Physical Activity: Inactive   Days of Exercise per Week: 0 days   Minutes of Exercise per Session: 0 min  Stress: No Stress Concern Present   Feeling of Stress : Not at all  Social Connections: Moderately Isolated   Frequency of Communication with Friends and Family: More than three times a week   Frequency of Social Gatherings with Friends and Family: More than three times a week   Attends Religious Services: More than 4 times per year   Active Member of [employer] or Organizations: No   Attends Banker Meetings: Never   Marital Status: Divorced  Catering manager Violence: Not At Risk   Fear of Current or Ex-Partner: No   Emotionally Abused: No   Physically Abused: No  Sexually Abused: No    Outpatient Medications Prior to Visit  Medication Sig Dispense Refill   alendronate (FOSAMAX) 70 MG tablet Take 1 tablet (70 mg total) by mouth every 7 (seven) days. Take with a full glass of water on an empty stomach. 12 tablet 4   ALPRAZolam (XANAX) 0.5 MG tablet Take 1 tablet (0.5 mg total) by mouth at bedtime. As needed. 30 tablet 1   aspirin EC 81 MG tablet Take 1 tablet (81 mg total) by mouth daily. 365 tablet 1   azelastine (ASTELIN) 0.1 % nasal spray Place 1 spray into both nostrils 2 (two) times daily. As needed for post nasal drip 30 mL 12   Calcium Carbonate-Vitamin D (OSCAL 500/200 D-3 PO) Take 1 tablet by mouth daily.     diclofenac sodium (VOLTAREN) 1 % GEL Apply 2 g topically 3 (three) times daily as needed. 100 g 1   escitalopram (LEXAPRO) 20 MG tablet Take 1  tablet (20 mg total) by mouth daily. 90 tablet 1   metoprolol succinate (TOPROL-XL) 25 MG 24 hr tablet TAKE 1 TABLET(25 MG) BY MOUTH DAILY 90 tablet 0   Multiple Vitamin (MULTIVITAMIN) tablet Take 1 tablet by mouth daily.     omega-3 acid ethyl esters (LOVAZA) 1 g capsule Take by mouth daily.     simvastatin (ZOCOR) 40 MG tablet Take 1 tablet (40 mg total) by mouth at bedtime. 90 tablet 3   No facility-administered medications prior to visit.    No Known Allergies  ROS Review of Systems  Constitutional:  Positive for fatigue.  Psychiatric/Behavioral:  Positive for dysphoric mood. The patient is nervous/anxious.      Objective:    Physical Exam Vitals and nursing note reviewed.  Constitutional:      Appearance: Normal appearance.  HENT:     Right Ear: External ear normal.     Left Ear: External ear normal.  Eyes:     General: No scleral icterus.       Right eye: No discharge.        Left eye: No discharge.     Conjunctiva/sclera: Conjunctivae normal.  Pulmonary:     Effort: Pulmonary effort is normal.  Neurological:     Mental Status: She is alert and oriented to person, place, and time.  Psychiatric:        Mood and Affect: Mood normal.        Behavior: Behavior normal.    BP 124/70   Pulse 60   Temp (!) 97.4 F (36.3 C) (Temporal)   Ht 5\' 3"  (1.6 m)   Wt 130 lb 12.8 oz (59.3 kg)   SpO2 99%   BMI 23.17 kg/m  Wt Readings from Last 3 Encounters:  09/12/20 130 lb 12.8 oz (59.3 kg)  07/12/20 128 lb 9.6 oz (58.3 kg)  06/15/20 133 lb 6.4 oz (60.5 kg)     Health Maintenance Due  Topic Date Due   Hepatitis C Screening  Never done   TETANUS/TDAP  Never done   COLONOSCOPY (Pts 45-76yrs Insurance coverage will need to be confirmed)  Never done   Zoster Vaccines- Shingrix (1 of 2) Never done   PNA vac Low Risk Adult (2 of 2 - PCV13) 05/24/2019    There are no preventive care reminders to display for this patient.  Lab Results  Component Value Date   TSH 2.10  07/14/2019   Lab Results  Component Value Date   WBC 10.5 07/12/2020   HGB 15.6 (H)  07/12/2020   HCT 46.6 (H) 07/12/2020   MCV 90.9 07/12/2020   PLT 292.0 07/12/2020   Lab Results  Component Value Date   NA 139 07/12/2020   K 4.5 07/12/2020   CO2 26 07/12/2020   GLUCOSE 95 07/12/2020   BUN 15 07/12/2020   CREATININE 0.73 07/12/2020   BILITOT 0.6 07/12/2020   ALKPHOS 85 07/12/2020   AST 23 07/12/2020   ALT 17 07/12/2020   PROT 6.8 07/12/2020   ALBUMIN 4.5 07/12/2020   CALCIUM 9.3 07/12/2020   GFR 80.68 07/12/2020   Lab Results  Component Value Date   CHOL 190 07/12/2020   Lab Results  Component Value Date   HDL 77.00 07/12/2020   Lab Results  Component Value Date   LDLCALC 94 07/12/2020   Lab Results  Component Value Date   TRIG 94.0 07/12/2020   Lab Results  Component Value Date   CHOLHDL 2 07/12/2020   No results found for: HGBA1C    Assessment & Plan:   Problem List Items Addressed This Visit       Cardiovascular and Mediastinum   Essential hypertension - Primary   PVD (peripheral vascular disease) (HCC)     Musculoskeletal and Integument   Age-related osteoporosis without current pathological fracture     Other   Healthcare maintenance   Relevant Orders   Ambulatory referral to Gastroenterology   Elevated LDL cholesterol level   Depression with anxiety    No orders of the defined types were placed in this encounter.   Follow-up: Return in about 3 months (around 12/13/2020).  Continue current medications and follow-up in 3 months.  Mliss Sax, MD

## 2020-09-23 ENCOUNTER — Other Ambulatory Visit: Payer: Self-pay | Admitting: Family Medicine

## 2020-09-23 DIAGNOSIS — E7800 Pure hypercholesterolemia, unspecified: Secondary | ICD-10-CM

## 2020-09-23 DIAGNOSIS — I739 Peripheral vascular disease, unspecified: Secondary | ICD-10-CM

## 2020-09-23 DIAGNOSIS — I6523 Occlusion and stenosis of bilateral carotid arteries: Secondary | ICD-10-CM

## 2020-09-23 DIAGNOSIS — I10 Essential (primary) hypertension: Secondary | ICD-10-CM

## 2020-12-12 ENCOUNTER — Other Ambulatory Visit: Payer: Self-pay | Admitting: Family Medicine

## 2020-12-12 DIAGNOSIS — Z1231 Encounter for screening mammogram for malignant neoplasm of breast: Secondary | ICD-10-CM

## 2020-12-13 ENCOUNTER — Encounter: Payer: Self-pay | Admitting: Family Medicine

## 2020-12-13 ENCOUNTER — Ambulatory Visit (INDEPENDENT_AMBULATORY_CARE_PROVIDER_SITE_OTHER): Payer: Medicare Other | Admitting: Family Medicine

## 2020-12-13 ENCOUNTER — Other Ambulatory Visit: Payer: Self-pay

## 2020-12-13 VITALS — BP 140/72 | HR 68 | Temp 97.2°F | Ht 63.0 in | Wt 136.2 lb

## 2020-12-13 DIAGNOSIS — I6523 Occlusion and stenosis of bilateral carotid arteries: Secondary | ICD-10-CM

## 2020-12-13 DIAGNOSIS — E7800 Pure hypercholesterolemia, unspecified: Secondary | ICD-10-CM | POA: Diagnosis not present

## 2020-12-13 DIAGNOSIS — M810 Age-related osteoporosis without current pathological fracture: Secondary | ICD-10-CM

## 2020-12-13 DIAGNOSIS — F418 Other specified anxiety disorders: Secondary | ICD-10-CM | POA: Diagnosis not present

## 2020-12-13 DIAGNOSIS — F419 Anxiety disorder, unspecified: Secondary | ICD-10-CM

## 2020-12-13 DIAGNOSIS — E559 Vitamin D deficiency, unspecified: Secondary | ICD-10-CM | POA: Diagnosis not present

## 2020-12-13 DIAGNOSIS — I10 Essential (primary) hypertension: Secondary | ICD-10-CM | POA: Diagnosis not present

## 2020-12-13 LAB — URINALYSIS, ROUTINE W REFLEX MICROSCOPIC
Bilirubin Urine: NEGATIVE
Hgb urine dipstick: NEGATIVE
Ketones, ur: NEGATIVE
Leukocytes,Ua: NEGATIVE
Nitrite: NEGATIVE
RBC / HPF: NONE SEEN (ref 0–?)
Specific Gravity, Urine: <=1.005 — AB (ref 1.000–1.030)
Total Protein, Urine: NEGATIVE
Urine Glucose: NEGATIVE
Urobilinogen, UA: 0.2 (ref 0.0–1.0)
pH: 6 (ref 5.0–8.0)

## 2020-12-13 LAB — COMPREHENSIVE METABOLIC PANEL
ALT: 13 U/L (ref 0–35)
AST: 16 U/L (ref 0–37)
Albumin: 4 g/dL (ref 3.5–5.2)
Alkaline Phosphatase: 92 U/L (ref 39–117)
BUN: 13 mg/dL (ref 6–23)
CO2: 27 mEq/L (ref 19–32)
Calcium: 8.8 mg/dL (ref 8.4–10.5)
Chloride: 104 mEq/L (ref 96–112)
Creatinine, Ser: 0.63 mg/dL (ref 0.40–1.20)
GFR: 86.77 mL/min (ref >60.00)
Glucose, Bld: 101 mg/dL — ABNORMAL HIGH (ref 70–99)
Potassium: 4.2 mEq/L (ref 3.5–5.1)
Sodium: 139 mEq/L (ref 135–145)
Total Bilirubin: 0.5 mg/dL (ref 0.2–1.2)
Total Protein: 6.3 g/dL (ref 6.0–8.3)

## 2020-12-13 LAB — VITAMIN D 25 HYDROXY (VIT D DEFICIENCY, FRACTURES): VITD: 50.86 ng/mL (ref 30.00–100.00)

## 2020-12-13 LAB — LIPID PANEL
Cholesterol: 179 mg/dL (ref 0–200)
HDL: 65.4 mg/dL (ref >39.00)
LDL Cholesterol: 94 mg/dL (ref 0–99)
NonHDL: 113.38
Total CHOL/HDL Ratio: 3
Triglycerides: 98 mg/dL (ref 0.0–149.0)
VLDL: 19.6 mg/dL (ref 0.0–40.0)

## 2020-12-13 MED ORDER — ALPRAZOLAM 0.5 MG PO TABS: 0.5000 mg | ORAL_TABLET | Freq: Every day | ORAL | 1 refills | Status: DC

## 2020-12-13 NOTE — Progress Notes (Addendum)
Established Patient Office Visit  Subjective:  Patient ID: Lori Allison, female    DOB: [birth year]  Age: 75 y.o. MRN: [id number]  CC:  Chief Complaint  Patient presents with   Follow-up    3 month follow up on anxiety, no concerns. Patient would like refill on Xanax.     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  Blood pressure at home is running in the 110s to 120 range over 60-70.  She is tolerating the higher dose of simvastatin.  Lexapro continues to make a big difference.  Continues with Fosamax and Os-Cal.  Her mother's mental decline continues to upset her.  She is concerned about her grandson who is gay.  Past Medical History:  Diagnosis Date   Chicken pox    Hay fever    Hyperlipidemia    Hypertension    UTI (urinary tract infection)     Past Surgical History:  Procedure Laterality Date   EYE SURGERY Bilateral 07/17/2018    Family History  Problem Relation Age of Onset   High blood pressure Mother    Breast cancer Mother    Heart disease Father    High blood pressure Sister    Early death Brother     Social History   Socioeconomic History   Marital status: Divorced    Spouse name: Not on file   Number of children: Not on file   Years of education: Not on file   Highest education level: Not on file  Occupational History   Occupation: retired  Tobacco Use   Smoking status: Former    Types: Cigarettes    Quit date: 2008    Years since quitting: 14.7   Smokeless tobacco: Former  Substance and Sexual Activity   Alcohol use: Not Currently   Drug use: Never   Sexual activity: Not on file  Other Topics Concern   Not on file  Social History Narrative   Not on file   Social Determinants of Health   Financial Resource Strain: Low Risk    Difficulty of Paying Living Expenses: Not hard at all  Food Insecurity: No Food  Insecurity   Worried About Programme researcher, broadcasting/film/video in the Last Year: Never true   Barista in the Last Year: Never true  Transportation Needs: No Transportation Needs   Lack of Transportation (Medical): No   Lack of Transportation (Non-Medical): No  Physical Activity: Inactive   Days of Exercise per Week: 0 days   Minutes of Exercise per Session: 0 min  Stress: No Stress Concern Present   Feeling of Stress : Not at all  Social Connections: Moderately Isolated   Frequency of Communication with Friends and Family: More than three times a week   Frequency of Social Gatherings with Friends and Family: More than three times a week   Attends Religious Services: More than 4 times per year   Active Member of [employer] or Organizations: No   Attends Banker Meetings: Never   Marital Status: Divorced  Catering manager Violence: Not At Risk   Fear of Current or Ex-Partner: No   Emotionally Abused: No   Physically Abused: No   Sexually Abused: No    Outpatient Medications Prior to Visit  Medication Sig Dispense Refill   aspirin EC 81 MG tablet Take 1 tablet (81 mg total) by mouth daily. 365 tablet  1   azelastine (ASTELIN) 0.1 % nasal spray Place 1 spray into both nostrils 2 (two) times daily. As needed for post nasal drip 30 mL 12   Calcium Carbonate-Vitamin D (OSCAL 500/200 D-3 PO) Take 1 tablet by mouth daily.     diclofenac sodium (VOLTAREN) 1 % GEL Apply 2 g topically 3 (three) times daily as needed. 100 g 1   escitalopram (LEXAPRO) 20 MG tablet Take 1 tablet (20 mg total) by mouth daily. 90 tablet 1   metoprolol succinate (TOPROL-XL) 25 MG 24 hr tablet TAKE 1 TABLET(25 MG) BY MOUTH DAILY 90 tablet 0   Multiple Vitamin (MULTIVITAMIN) tablet Take 1 tablet by mouth daily.     omega-3 acid ethyl esters (LOVAZA) 1 g capsule Take by mouth daily.     simvastatin (ZOCOR) 40 MG tablet Take 1 tablet (40 mg total) by mouth at bedtime. 90 tablet 3   alendronate (FOSAMAX) 70 MG tablet  Take 1 tablet (70 mg total) by mouth every 7 (seven) days. Take with a full glass of water on an empty stomach. 12 tablet 4   ALPRAZolam (XANAX) 0.5 MG tablet Take 1 tablet (0.5 mg total) by mouth at bedtime. As needed. 30 tablet 1   No facility-administered medications prior to visit.    No Known Allergies  ROS Review of Systems  Constitutional: Negative.   HENT: Negative.    Respiratory: Negative.    Cardiovascular: Negative.   Gastrointestinal: Negative.   Genitourinary: Negative.   Musculoskeletal:  Negative for gait problem and joint swelling.     Objective:    Physical Exam Vitals and nursing note reviewed.  Constitutional:      Appearance: Normal appearance.  HENT:     Head: Normocephalic and atraumatic.     Right Ear: Tympanic membrane, ear canal and external ear normal.     Left Ear: Tympanic membrane, ear canal and external ear normal.     Mouth/Throat:     Mouth: Mucous membranes are moist.     Pharynx: Oropharynx is clear. No oropharyngeal exudate or posterior oropharyngeal erythema.  Eyes:     General: No scleral icterus.       Right eye: No discharge.        Left eye: No discharge.     Extraocular Movements: Extraocular movements intact.     Conjunctiva/sclera: Conjunctivae normal.     Pupils: Pupils are equal, round, and reactive to light.  Neck:     Vascular: Carotid bruit (very soft) present.  Cardiovascular:     Rate and Rhythm: Normal rate and regular rhythm.  Pulmonary:     Effort: Pulmonary effort is normal.     Breath sounds: Normal breath sounds.  Abdominal:     General: Abdomen is flat. Bowel sounds are normal.     Palpations: Abdomen is soft.  Musculoskeletal:     Cervical back: No rigidity or tenderness.  Lymphadenopathy:     Cervical: No cervical adenopathy.  Skin:    General: Skin is warm and dry.  Neurological:     General: No focal deficit present.     Mental Status: She is alert and oriented to person, place, and time.   Psychiatric:        Mood and Affect: Mood normal.        Behavior: Behavior normal.    BP 140/72 (BP Location: Left Arm, Patient Position: Sitting, Cuff Size: Normal)   Pulse 68   Temp (!) 97.2 F (36.2 C) (Temporal)  Ht 5\' 3"  (1.6 m)   Wt 136 lb 3.2 oz (61.8 kg)   SpO2 98%   BMI 24.13 kg/m  Wt Readings from Last 3 Encounters:  12/13/20 136 lb 3.2 oz (61.8 kg)  09/12/20 130 lb 12.8 oz (59.3 kg)  07/12/20 128 lb 9.6 oz (58.3 kg)     Health Maintenance Due  Topic Date Due   Hepatitis C Screening  Never done   TETANUS/TDAP  Never done   COLONOSCOPY (Pts 45-41yrs Insurance coverage will need to be confirmed)  Never done   Zoster Vaccines- Shingrix (1 of 2) Never done   INFLUENZA VACCINE  10/16/2020    There are no preventive care reminders to display for this patient.  Lab Results  Component Value Date   TSH 2.10 07/14/2019   Lab Results  Component Value Date   WBC 10.5 07/12/2020   HGB 15.6 (H) 07/12/2020   HCT 46.6 (H) 07/12/2020   MCV 90.9 07/12/2020   PLT 292.0 07/12/2020   Lab Results  Component Value Date   NA 139 12/13/2020   K 4.2 12/13/2020   CO2 27 12/13/2020   GLUCOSE 101 (H) 12/13/2020   BUN 13 12/13/2020   CREATININE 0.63 12/13/2020   BILITOT 0.5 12/13/2020   ALKPHOS 92 12/13/2020   AST 16 12/13/2020   ALT 13 12/13/2020   PROT 6.3 12/13/2020   ALBUMIN 4.0 12/13/2020   CALCIUM 8.8 12/13/2020   GFR 86.77 12/13/2020   Lab Results  Component Value Date   CHOL 179 12/13/2020   Lab Results  Component Value Date   HDL 65.40 12/13/2020   Lab Results  Component Value Date   LDLCALC 94 12/13/2020   Lab Results  Component Value Date   TRIG 98.0 12/13/2020   Lab Results  Component Value Date   CHOLHDL 3 12/13/2020   No results found for: HGBA1C    Assessment & Plan:   Problem List Items Addressed This Visit       Cardiovascular and Mediastinum   Essential hypertension - Primary   Relevant Medications   ezetimibe (ZETIA) 10 MG  tablet   Other Relevant Orders   Comprehensive metabolic panel (Completed)   Urinalysis, Routine w reflex microscopic (Completed)   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, bilateral   Relevant Medications   ezetimibe (ZETIA) 10 MG tablet   Other Relevant Orders   VAS 12/15/2020 CAROTID     Musculoskeletal and Integument   Age-related osteoporosis without current pathological fracture     Other   Elevated LDL cholesterol level   Relevant Medications   ezetimibe (ZETIA) 10 MG tablet   Other Relevant Orders   Lipid panel (Completed)   Anxiety   Relevant Medications   ALPRAZolam (XANAX) 0.5 MG tablet   Depression with anxiety   Relevant Medications   ALPRAZolam (XANAX) 0.5 MG tablet   Other Visit Diagnoses     Vitamin D deficiency       Relevant Orders   VITAMIN D 25 Hydroxy (Vit-D Deficiency, Fractures) (Completed)       Meds ordered this encounter  Medications   ALPRAZolam (XANAX) 0.5 MG tablet    Sig: Take 1 tablet (0.5 mg total) by mouth at bedtime. As needed.    Dispense:  30 tablet    Refill:  1   ezetimibe (ZETIA) 10 MG tablet    Sig: Take 1 tablet (10 mg total) by mouth daily.    Dispense:  90 tablet    Refill:  3  Follow-up: Return in about 6 months (around 06/12/2021), or if symptoms worsen or fail to improve.  Continues to use alprazolam sparingly.  Advised that her mother's decline will be steady.  Continue Lexapro.  Hopefully LDL will be lower on the higher dose of Zocor.  She is skeptical about the new COVID vaccines.  Advised her to have it.  She will have her flu vaccine at a later date.  Advised her to call if she develops COVID symptoms.  Mliss Sax, MD

## 2020-12-14 ENCOUNTER — Other Ambulatory Visit: Payer: Self-pay | Admitting: Family Medicine

## 2020-12-14 DIAGNOSIS — M810 Age-related osteoporosis without current pathological fracture: Secondary | ICD-10-CM

## 2020-12-15 MED ORDER — EZETIMIBE 10 MG PO TABS: 10.0000 mg | ORAL_TABLET | Freq: Every day | ORAL | 3 refills | Status: DC

## 2020-12-15 NOTE — Addendum Note (Signed)
Addended by: Andrez Grime on: 12/15/2020 08:44 AM   Modules accepted: Orders

## 2020-12-22 ENCOUNTER — Other Ambulatory Visit: Payer: Self-pay | Admitting: Family Medicine

## 2020-12-22 DIAGNOSIS — I6523 Occlusion and stenosis of bilateral carotid arteries: Secondary | ICD-10-CM

## 2020-12-22 DIAGNOSIS — I739 Peripheral vascular disease, unspecified: Secondary | ICD-10-CM

## 2020-12-22 DIAGNOSIS — I10 Essential (primary) hypertension: Secondary | ICD-10-CM

## 2020-12-25 ENCOUNTER — Ambulatory Visit: Payer: Self-pay

## 2020-12-25 ENCOUNTER — Ambulatory Visit (INDEPENDENT_AMBULATORY_CARE_PROVIDER_SITE_OTHER): Payer: Medicare Other

## 2020-12-25 ENCOUNTER — Ambulatory Visit (INDEPENDENT_AMBULATORY_CARE_PROVIDER_SITE_OTHER): Payer: Medicare Other | Admitting: Family Medicine

## 2020-12-25 ENCOUNTER — Encounter: Payer: Self-pay | Admitting: Family Medicine

## 2020-12-25 ENCOUNTER — Other Ambulatory Visit: Payer: Self-pay

## 2020-12-25 VITALS — BP 130/70 | HR 64 | Ht 63.0 in | Wt 136.4 lb

## 2020-12-25 DIAGNOSIS — G8929 Other chronic pain: Secondary | ICD-10-CM

## 2020-12-25 DIAGNOSIS — M25562 Pain in left knee: Secondary | ICD-10-CM | POA: Diagnosis not present

## 2020-12-25 DIAGNOSIS — M25561 Pain in right knee: Secondary | ICD-10-CM

## 2020-12-25 DIAGNOSIS — M170 Bilateral primary osteoarthritis of knee: Secondary | ICD-10-CM

## 2020-12-25 NOTE — Patient Instructions (Addendum)
Good to see you today.  You had B knee injections.  Call or go to the ER if you develop a large red swollen joint with extreme pain or oozing puss.   Please get an Xray today before you leave.  Follow-up as needed.  I can repeat these steroid injections every 3 months.  Let me know if you want the Zilretta injections (long-acting steroid). I'll need a couple weeks notice to get those authorized.

## 2020-12-25 NOTE — Progress Notes (Signed)
I, Christoper Fabian, LAT, ATC, am serving as scribe for Dr. [name].  Lori Allison is a 75 y.o. female who presents to [facility] today for continued bilat knee pain. Pt was last seen by Dr. Denyse Amass on 07/10/20 and was given Durolane injections in bilat knees. Today, pt reports that she has constant pain in her B knees.  She states that cortisone injections seem to help the most.  She reports more swelling in her R knee than her L.  Dx imaging: 12/31/17 L knee MRI  Pertinent review of systems: No fevers or chills  Relevant historical information: Hypertension.  History of PVD.   Exam:  BP 130/70 (BP Location: Left Arm, Patient Position: Sitting, Cuff Size: Normal)   Pulse 64   Ht 5\' 3"  (1.6 m)   Wt 136 lb 6.4 oz (61.9 kg)   SpO2 98%   BMI 24.16 kg/m  General: Well Developed, well nourished, and in no acute distress.   MSK: Right knee moderate effusion normal motion with crepitation.  Intact strength.  Left knee no effusion.  Normal motion with crepitation.  Intact strength.    Lab and Radiology Results  Procedure: Real-time Ultrasound Guided Injection of right knee superior patellar space Device: Philips Affiniti 50G Images permanently stored and available for review in PACS Verbal informed consent obtained.  Discussed risks and benefits of procedure. Warned about infection bleeding damage to structures skin hypopigmentation and fat atrophy among others. Patient expresses understanding and agreement Time-out conducted.   Noted no overlying erythema, induration, or other signs of local infection.   Skin prepped in a sterile fashion.   Local anesthesia: Topical Ethyl chloride.   With sterile technique and under real time ultrasound guidance: 40 mg of Kenalog and 2 mL of Marcaine injected into knee joint. Fluid seen entering the joint capsule.   Completed without difficulty   Pain immediately resolved suggesting accurate placement of the  medication.   Advised to call if fevers/chills, erythema, induration, drainage, or persistent bleeding.   Images permanently stored and available for review in the ultrasound unit.  Impression: Technically successful ultrasound guided injection.    Procedure: Real-time Ultrasound Guided Injection of left knee superior patellar space Device: Philips Affiniti 50G Images permanently stored and available for review in PACS Verbal informed consent obtained.  Discussed risks and benefits of procedure. Warned about infection bleeding damage to structures skin hypopigmentation and fat atrophy among others. Patient expresses understanding and agreement Time-out conducted.   Noted no overlying erythema, induration, or other signs of local infection.   Skin prepped in a sterile fashion.   Local anesthesia: Topical Ethyl chloride.   With sterile technique and under real time ultrasound guidance: 40 mg of Kenalog and 2 mL of Marcaine injected into the knee joint. Fluid seen entering the joint capsule.   Completed without difficulty   Pain immediately resolved suggesting accurate placement of the medication.   Advised to call if fevers/chills, erythema, induration, drainage, or persistent bleeding.   Images permanently stored and available for review in the ultrasound unit.  Impression: Technically successful ultrasound guided injection.    X-ray images bilateral knees obtained today personally and independently interpreted  Right knee: Significant medial DJD and moderate patellofemoral DJD.  No acute fractures.  Left knee: Significant medial and patellofemoral DJD.  No acute fractures.  Await formal radiology review    Assessment and Plan: 75 y.o. female with bilateral knee pain due to DJD.  Plan for  repeat steroid injection.  Patient had trial of hyaluronic acid injections with mediocre results.  Could use Zilretta in the future if needed.  However ultimately she will likely require knee  replacement. Recheck as needed.   PDMP not reviewed this encounter. Orders Placed This Encounter  Procedures   [place] LIMITED JOINT SPACE STRUCTURES LOW BILAT(NO LINKED CHARGES)    Standing Status:   Future    Number of Occurrences:   1    Standing Expiration Date:   06/25/2021    Order Specific Question:   Reason for Exam (SYMPTOM  OR DIAGNOSIS REQUIRED)    Answer:   bilateral knee pain    Order Specific Question:   Preferred imaging location?    Answer:   [facility] Knee AP/LAT W/Sunrise Left    Standing Status:   Future    Number of Occurrences:   1    Standing Expiration Date:   01/25/2021    Order Specific Question:   Reason for Exam (SYMPTOM  OR DIAGNOSIS REQUIRED)    Answer:   L knee pain    Order Specific Question:   Preferred imaging location?    Answer:   [facility] Knee AP/LAT W/Sunrise Right    Standing Status:   Future    Number of Occurrences:   1    Standing Expiration Date:   01/25/2021    Order Specific Question:   Reason for Exam (SYMPTOM  OR DIAGNOSIS REQUIRED)    Answer:   R knee pain    Order Specific Question:   Preferred imaging location?    Answer:   [facility]   No orders of the defined types were placed in this encounter.    Discussed warning signs or symptoms. Please see discharge instructions. Patient expresses understanding.   The above documentation has been reviewed and is accurate and complete [name], M.D.

## 2020-12-27 NOTE — Progress Notes (Signed)
Right knee x-ray shows medium arthritis

## 2020-12-27 NOTE — Progress Notes (Signed)
Left knee x-ray shows severe arthritis

## 2021-01-01 ENCOUNTER — Other Ambulatory Visit: Payer: Self-pay | Admitting: Family Medicine

## 2021-01-01 ENCOUNTER — Ambulatory Visit (HOSPITAL_BASED_OUTPATIENT_CLINIC_OR_DEPARTMENT_OTHER)
Admission: RE | Admit: 2021-01-01 | Discharge: 2021-01-01 | Disposition: A | Discharge status: Home / Self Care | Payer: Medicare Other | Source: Ambulatory Visit | Attending: Family Medicine | Admitting: Family Medicine

## 2021-01-01 ENCOUNTER — Other Ambulatory Visit: Payer: Self-pay

## 2021-01-01 DIAGNOSIS — I6523 Occlusion and stenosis of bilateral carotid arteries: Secondary | ICD-10-CM | POA: Diagnosis not present

## 2021-01-01 DIAGNOSIS — F419 Anxiety disorder, unspecified: Secondary | ICD-10-CM

## 2021-01-10 ENCOUNTER — Ambulatory Visit
Admission: RE | Admit: 2021-01-10 | Discharge: 2021-01-10 | Disposition: A | Discharge status: Home / Self Care | Payer: Medicare Other | Source: Ambulatory Visit | Attending: Family Medicine | Admitting: Family Medicine

## 2021-01-10 ENCOUNTER — Other Ambulatory Visit: Payer: Self-pay

## 2021-01-10 DIAGNOSIS — Z1231 Encounter for screening mammogram for malignant neoplasm of breast: Secondary | ICD-10-CM

## 2021-03-22 ENCOUNTER — Other Ambulatory Visit: Payer: Self-pay | Admitting: Family Medicine

## 2021-03-22 DIAGNOSIS — I6523 Occlusion and stenosis of bilateral carotid arteries: Secondary | ICD-10-CM

## 2021-03-22 DIAGNOSIS — I739 Peripheral vascular disease, unspecified: Secondary | ICD-10-CM

## 2021-03-22 DIAGNOSIS — I10 Essential (primary) hypertension: Secondary | ICD-10-CM

## 2021-04-06 ENCOUNTER — Encounter: Payer: Self-pay | Admitting: Family Medicine

## 2021-04-06 ENCOUNTER — Telehealth (INDEPENDENT_AMBULATORY_CARE_PROVIDER_SITE_OTHER): Payer: Medicare Other | Admitting: Family Medicine

## 2021-04-06 VITALS — BP 120/60 | HR 68 | Temp 98.0°F | Ht 63.0 in | Wt 136.0 lb

## 2021-04-06 DIAGNOSIS — J0100 Acute maxillary sinusitis, unspecified: Secondary | ICD-10-CM | POA: Diagnosis not present

## 2021-04-06 MED ORDER — AMOXICILLIN 875 MG PO TABS: 875.0000 mg | ORAL_TABLET | Freq: Two times a day (BID) | ORAL | 0 refills | Status: AC

## 2021-04-06 NOTE — Progress Notes (Signed)
Established Patient Office Visit  Subjective:  Patient ID: Lori Allison, female    DOB: 12/10/1945  Age: 76 y.o. MRN: [id number]  CC:  Chief Complaint  Patient presents with   Cough    Cough, horse voice, restless due to cough thick yellow mucus, nasal congestion, pressure in face symptoms x 1 week. Negative COVID test     HPI Lori Allison presents for evaluation of a 7 to 8-day history of URI signs and symptoms that are worsening with increasing facial pressure and purulent rhinorrhea.  She is feeling the pressure in her cheekbones.  There is some postnasal drip with cough.  Temperatures have been low-grade.  She denies any tightness in the chest wheezing or difficulty breathing.  No myalgias or arthralgias.  Home COVID tests have been negative x2.  Past Medical History:  Diagnosis Date   Chicken pox    Hay fever    Hyperlipidemia    Hypertension    UTI (urinary tract infection)     Past Surgical History:  Procedure Laterality Date   EYE SURGERY Bilateral 07/17/2018    Family History  Problem Relation Age of Onset   High blood pressure Mother    Breast cancer Mother    Heart disease Father    High blood pressure Sister    Early death Brother     Social History   Socioeconomic History   Marital status: Divorced    Spouse name: Not on file   Number of children: Not on file   Years of education: Not on file   Highest education level: Not on file  Occupational History   Occupation: retired  Tobacco Use   Smoking status: Former    Types: Cigarettes    Quit date: 2008    Years since quitting: 15.0   Smokeless tobacco: Former  Substance and Sexual Activity   Alcohol use: Not Currently   Drug use: Never   Sexual activity: Not on file  Other Topics Concern   Not on file  Social History Narrative   Not on file   Social Determinants of Health   Financial Resource Strain: Low Risk    Difficulty of Paying Living Expenses: Not hard at all  Food Insecurity: No  Food Insecurity   Worried About Programme researcher, broadcasting/film/videounning Out of Food in the Last Year: Never true   Baristaan Out of Food in the Last Year: Never true  Transportation Needs: No Transportation Needs   Lack of Transportation (Medical): No   Lack of Transportation (Non-Medical): No  Physical Activity: Inactive   Days of Exercise per Week: 0 days   Minutes of Exercise per Session: 0 min  Stress: No Stress Concern Present   Feeling of Stress : Not at all  Social Connections: Moderately Isolated   Frequency of Communication with Friends and Family: More than three times a week   Frequency of Social Gatherings with Friends and Family: More than three times a week   Attends Religious Services: More than 4 times per year   Active Member of [employer] Financial[employer] or Organizations: No   Attends BankerClub or Organization Meetings: Never   Marital Status: Divorced  Catering managerntimate Partner Violence: Not At Risk   Fear of Current or Ex-Partner: No   Emotionally Abused: No   Physically Abused: No   Sexually Abused: No    Outpatient Medications Prior to Visit  Medication Sig Dispense Refill   alendronate (FOSAMAX) 70 MG tablet TAKE 1 TABLET(70 MG) BY MOUTH EVERY 7 DAYS  WITH A FULL GLASS OF WATER AND ON AN EMPTY STOMACH 12 tablet 4   ALPRAZolam (XANAX) 0.5 MG tablet Take 1 tablet (0.5 mg total) by mouth at bedtime. As needed. 30 tablet 1   aspirin EC 81 MG tablet Take 1 tablet (81 mg total) by mouth daily. 365 tablet 1   azelastine (ASTELIN) 0.1 % nasal spray Place 1 spray into both nostrils 2 (two) times daily. As needed for post nasal drip 30 mL 12   Calcium Carbonate-Vitamin D (OSCAL 500/200 D-3 PO) Take 1 tablet by mouth daily.     diclofenac sodium (VOLTAREN) 1 % GEL Apply 2 g topically 3 (three) times daily as needed. 100 g 1   escitalopram (LEXAPRO) 20 MG tablet TAKE 1 TABLET(20 MG) BY MOUTH DAILY 90 tablet 1   ezetimibe (ZETIA) 10 MG tablet Take 1 tablet (10 mg total) by mouth daily. 90 tablet 3   metoprolol succinate (TOPROL-XL) 25 MG 24 hr  tablet TAKE 1 TABLET(25 MG) BY MOUTH DAILY 90 tablet 0   Multiple Vitamin (MULTIVITAMIN) tablet Take 1 tablet by mouth daily.     omega-3 acid ethyl esters (LOVAZA) 1 g capsule Take by mouth daily.     simvastatin (ZOCOR) 40 MG tablet Take 1 tablet (40 mg total) by mouth at bedtime. 90 tablet 3   No facility-administered medications prior to visit.    No Known Allergies  ROS Review of Systems  Constitutional:  Negative for chills, diaphoresis, fatigue, fever and unexpected weight change.  HENT:  Positive for congestion, postnasal drip, rhinorrhea, sinus pressure, sinus pain and voice change. Negative for dental problem.   Respiratory:  Positive for cough. Negative for chest tightness, shortness of breath and wheezing.   Musculoskeletal:  Positive for arthralgias and myalgias.  Skin:  Negative for pallor and rash.  Neurological:  Positive for headaches.     Objective:    Physical Exam Nursing note reviewed.  Pulmonary:     Effort: Pulmonary effort is normal.  Neurological:     Mental Status: She is alert and oriented to person, place, and time.  Psychiatric:        Mood and Affect: Mood normal.        Behavior: Behavior normal.    BP 120/60    Pulse 68 Comment: per patient   Temp 98 F (36.7 C) (Temporal)    Ht 5\' 3"  (1.6 m)    Wt 136 lb (61.7 kg)    SpO2 97% Comment: per patient   BMI 24.09 kg/m  Wt Readings from Last 3 Encounters:  04/06/21 136 lb (61.7 kg)  12/25/20 136 lb 6.4 oz (61.9 kg)  12/13/20 136 lb 3.2 oz (61.8 kg)     Health Maintenance Due  Topic Date Due   Hepatitis C Screening  Never done   TETANUS/TDAP  Never done   COLONOSCOPY (Pts 45-66yrs Insurance coverage will need to be confirmed)  Never done   Zoster Vaccines- Shingrix (1 of 2) Never done   Pneumonia Vaccine 82+ Years old (2 - PCV) 05/24/2019    There are no preventive care reminders to display for this patient.  Lab Results  Component Value Date   TSH 2.10 07/14/2019   Lab Results   Component Value Date   WBC 10.5 07/12/2020   HGB 15.6 (H) 07/12/2020   HCT 46.6 (H) 07/12/2020   MCV 90.9 07/12/2020   PLT 292.0 07/12/2020   Lab Results  Component Value Date   NA 139 12/13/2020  K 4.2 12/13/2020   CO2 27 12/13/2020   GLUCOSE 101 (H) 12/13/2020   BUN 13 12/13/2020   CREATININE 0.63 12/13/2020   BILITOT 0.5 12/13/2020   ALKPHOS 92 12/13/2020   AST 16 12/13/2020   ALT 13 12/13/2020   PROT 6.3 12/13/2020   ALBUMIN 4.0 12/13/2020   CALCIUM 8.8 12/13/2020   GFR 86.77 12/13/2020   Lab Results  Component Value Date   CHOL 179 12/13/2020   Lab Results  Component Value Date   HDL 65.40 12/13/2020   Lab Results  Component Value Date   LDLCALC 94 12/13/2020   Lab Results  Component Value Date   TRIG 98.0 12/13/2020   Lab Results  Component Value Date   CHOLHDL 3 12/13/2020   No results found for: HGBA1C    Assessment & Plan:   Problem List Items Addressed This Visit   None Visit Diagnoses     Acute non-recurrent maxillary sinusitis    -  Primary   Relevant Medications   amoxicillin (AMOXIL) 875 MG tablet       Meds ordered this encounter  Medications   amoxicillin (AMOXIL) 875 MG tablet    Sig: Take 1 tablet (875 mg total) by mouth 2 (two) times daily for 10 days.    Dispense:  20 tablet    Refill:  0    Follow-up: Return if symptoms worsen or fail to improve.    Mliss Sax, MD  Virtual Visit via Telephone Note  I connected with Lori Allison on 04/06/21 at  3:00 PM EST by telephone and verified that I am speaking with the correct person using two identifiers.  Location: Patient: home alone. Provider: work   I discussed the limitations, risks, security and privacy concerns of performing an evaluation and management service by telephone and the availability of in person appointments. I also discussed with the patient that there may be a patient responsible charge related to this service. The patient expressed  understanding and agreed to proceed.   History of Present Illness:    Observations/Objective:   Assessment and Plan:   Follow Up Instructions:    I discussed the assessment and treatment plan with the patient. The patient was provided an opportunity to ask questions and all were answered. The patient agreed with the plan and demonstrated an understanding of the instructions.   The patient was advised to call back or seek an in-person evaluation if the symptoms worsen or if the condition fails to improve as anticipated.  I provided 20 minutes of non-face-to-face time during this encounter.   Mliss Sax, MD   Interactive video and audio telecommunications were attempted between myself and the patient. However they failed due to the patient having technical difficulties or not having access to video capability. We continued and completed with audio only.

## 2021-05-15 ENCOUNTER — Ambulatory Visit: Payer: Medicare Other

## 2021-05-23 ENCOUNTER — Ambulatory Visit (INDEPENDENT_AMBULATORY_CARE_PROVIDER_SITE_OTHER): Payer: Medicare Other

## 2021-05-23 DIAGNOSIS — Z0000 Encounter for general adult medical examination without abnormal findings: Secondary | ICD-10-CM | POA: Diagnosis not present

## 2021-05-23 NOTE — Progress Notes (Signed)
Subjective:   Lori Allison is a 76 y.o. female who presents for Medicare Annual (Subsequent) preventive examination.  I connected with Lori Allison today by telephone and verified that I am speaking with the correct person using two identifiers. Location patient: home Location provider: work Persons participating in the virtual visit: patient, provider.   I discussed the limitations, risks, security and privacy concerns of performing an evaluation and management service by telephone and the availability of in person appointments. I also discussed with the patient that there may be a patient responsible charge related to this service. The patient expressed understanding and verbally consented to this telephonic visit.    Interactive audio and video telecommunications were attempted between this provider and patient, however failed, due to patient having technical difficulties OR patient did not have access to video capability.  We continued and completed visit with audio only.    Review of Systems     Cardiac Risk Factors include: advanced age (>67men, >59 women)     Objective:    Today's Vitals   There is no height or weight on file to calculate BMI.  Advanced Directives 05/23/2021 05/09/2020 05/05/2019  Does Patient Have a Medical Advance Directive? No No No  Would patient like information on creating a medical advance directive? No - Patient declined Yes (MAU/Ambulatory/Procedural Areas - Information given) No - Patient declined    Current Medications (verified) Outpatient Encounter Medications as of 05/23/2021  Medication Sig   alendronate (FOSAMAX) 70 MG tablet TAKE 1 TABLET(70 MG) BY MOUTH EVERY 7 DAYS WITH A FULL GLASS OF WATER AND ON AN EMPTY STOMACH   ALPRAZolam (XANAX) 0.5 MG tablet Take 1 tablet (0.5 mg total) by mouth at bedtime. As needed.   aspirin EC 81 MG tablet Take 1 tablet (81 mg total) by mouth daily.   azelastine (ASTELIN) 0.1 % nasal spray Place 1 spray into both  nostrils 2 (two) times daily. As needed for post nasal drip   Calcium Carbonate-Vitamin D (OSCAL 500/200 D-3 PO) Take 1 tablet by mouth daily.   diclofenac sodium (VOLTAREN) 1 % GEL Apply 2 g topically 3 (three) times daily as needed.   escitalopram (LEXAPRO) 20 MG tablet TAKE 1 TABLET(20 MG) BY MOUTH DAILY   ezetimibe (ZETIA) 10 MG tablet Take 1 tablet (10 mg total) by mouth daily.   metoprolol succinate (TOPROL-XL) 25 MG 24 hr tablet TAKE 1 TABLET(25 MG) BY MOUTH DAILY   Multiple Vitamin (MULTIVITAMIN) tablet Take 1 tablet by mouth daily.   omega-3 acid ethyl esters (LOVAZA) 1 g capsule Take by mouth daily.   simvastatin (ZOCOR) 40 MG tablet Take 1 tablet (40 mg total) by mouth at bedtime.   No facility-administered encounter medications on file as of 05/23/2021.    Allergies (verified) Patient has no known allergies.   History: Past Medical History:  Diagnosis Date   Chicken pox    Hay fever    Hyperlipidemia    Hypertension    UTI (urinary tract infection)    Past Surgical History:  Procedure Laterality Date   EYE SURGERY Bilateral 07/17/2018   Family History  Problem Relation Age of Onset   High blood pressure Mother    Breast cancer Mother    Heart disease Father    High blood pressure Sister    Early death Brother    Social History   Socioeconomic History   Marital status: Divorced    Spouse name: Not on file   Number of children:  Not on file   Years of education: Not on file   Highest education level: Not on file  Occupational History   Occupation: retired  Tobacco Use   Smoking status: Former    Types: Cigarettes    Quit date: 2008    Years since quitting: 15.1   Smokeless tobacco: Former  Substance and Sexual Activity   Alcohol use: Not Currently   Drug use: Never   Sexual activity: Not on file  Other Topics Concern   Not on file  Social History Narrative   Not on file   Social Determinants of Health   Financial Resource Strain: Low Risk     Difficulty of Paying Living Expenses: Not hard at all  Food Insecurity: No Food Insecurity   Worried About Charity fundraiser in the Last Year: Never true   Arboriculturist in the Last Year: Never true  Transportation Needs: No Transportation Needs   Lack of Transportation (Medical): No   Lack of Transportation (Non-Medical): No  Physical Activity: Insufficiently Active   Days of Exercise per Week: 3 days   Minutes of Exercise per Session: 30 min  Stress: Not on file  Social Connections: Moderately Isolated   Frequency of Communication with Friends and Family: Twice a week   Frequency of Social Gatherings with Friends and Family: Twice a week   Attends Religious Services: More than 4 times per year   Active Member of [employer] or Organizations: No   Attends Music therapist: Never   Marital Status: Divorced    Tobacco Counseling Counseling given: Not Answered   Clinical Intake:  Pre-visit preparation completed: Yes  Pain : No/denies pain     Nutritional Risks: None Diabetes: No  How often do you need to have someone help you when you read instructions, pamphlets, or other written materials from your doctor or pharmacy?: 1 - Never What is the last grade level you completed in school?: high School  Diabetic?no   Interpreter Needed?: No  Information entered by :: L.Josh Nicolosi,LPN   Activities of Daily Living In your present state of health, do you have any difficulty performing the following activities: 05/23/2021  Hearing? N  Vision? N  Difficulty concentrating or making decisions? N  Walking or climbing stairs? N  Dressing or bathing? N  Doing errands, shopping? N  Preparing Food and eating ? N  Using the Toilet? N  In the past six months, have you accidently leaked urine? N  Do you have problems with loss of bowel control? N  Managing your Medications? N  Managing your Finances? N  Housekeeping or managing your Housekeeping? N  Some recent data might be  hidden    Patient Care Team: Libby Maw, MD as PCP - General (Family Medicine)  Indicate any recent Medical Services you may have received from other than [facility] providers in the past year (date may be approximate).     Assessment:   This is a routine wellness examination for Mikal.  Hearing/Vision screen Vision Screening - Comments:: Annual eye exams wears glasses   Dietary issues and exercise activities discussed: Current Exercise Habits: Home exercise routine, Type of exercise: walking, Time (Minutes): 30, Frequency (Times/Week): 3, Weekly Exercise (Minutes/Week): 90, Intensity: Mild, Exercise limited by: None identified   Goals Addressed             This Visit's Progress    Increase physical activity   On track      Depression Screen  PHQ 2/9 Scores 05/23/2021 05/23/2021 04/06/2021 12/13/2020 12/13/2020 09/12/2020 09/12/2020  PHQ - 2 Score 0 0 0 2 0 2 2  PHQ- 9 Score - - - 8 - 7 -    Fall Risk Fall Risk  05/23/2021 04/06/2021 12/13/2020 09/12/2020 07/12/2020  Falls in the past year? 0 0 0 0 0  Number falls in past yr: 0 0 - - -  Injury with Fall? 0 - - - -  Risk for fall due to : No Fall Risks - - - -  Follow up Falls evaluation completed - - - -    FALL RISK PREVENTION PERTAINING TO THE HOME:  Any stairs in or around the home? Yes  If so, are there any without handrails? No  Home free of loose throw rugs in walkways, pet beds, electrical cords, etc? Yes  Adequate lighting in your home to reduce risk of falls? Yes   ASSISTIVE DEVICES UTILIZED TO PREVENT FALLS:  Life alert? No  Use of a cane, walker or w/c? No  Grab bars in the bathroom? No  Shower chair or bench in shower? No  Elevated toilet seat or a handicapped toilet? No    Cognitive Function:    Normal cognitive status assessed by direct observation by this Nurse Health Advisor. No abnormalities found.      Immunizations Immunization History  Administered Date(s) Administered   Fluad Quad(high  Dose 65+) 12/28/2018   Influenza, High Dose Seasonal PF 12/28/2013, 12/10/2017   Influenza-Unspecified 11/17/2019, 12/13/2020   PFIZER(Purple Top)SARS-COV-2 Vaccination 04/24/2019, 05/19/2019, 12/30/2019   Pneumococcal Polysaccharide-23 05/24/2018    TDAP status: Up to date  Flu Vaccine status: Up to date  Pneumococcal vaccine status: Up to date  Covid-19 vaccine status: Completed vaccines  Qualifies for Shingles Vaccine? Yes   Zostavax completed No   Shingrix Completed?: No.    Education has been provided regarding the importance of this vaccine. Patient has been advised to call insurance company to determine out of pocket expense if they have not yet received this vaccine. Advised may also receive vaccine at local pharmacy or Health Dept. Verbalized acceptance and understanding.  Screening Tests Health Maintenance  Topic Date Due   Hepatitis C Screening  Never done   TETANUS/TDAP  Never done   COLONOSCOPY (Pts 45-71yrs Insurance coverage will need to be confirmed)  Never done   Zoster Vaccines- Shingrix (1 of 2) Never done   Pneumonia Vaccine 60+ Years old (2 - PCV) 05/24/2019   INFLUENZA VACCINE  Completed   DEXA SCAN  Completed   HPV VACCINES  Aged Out   COVID-19 Vaccine  Discontinued    Health Maintenance  Health Maintenance Due  Topic Date Due   Hepatitis C Screening  Never done   TETANUS/TDAP  Never done   COLONOSCOPY (Pts 45-72yrs Insurance coverage will need to be confirmed)  Never done   Zoster Vaccines- Shingrix (1 of 2) Never done   Pneumonia Vaccine 11+ Years old (2 - PCV) 05/24/2019    Colorectal cancer screening: No longer required.   Mammogram status: No longer required due to age.  Bone Density status: Completed 10/28/2019. Results reflect: Bone density results: OSTEOPENIA. Repeat every 5 years.  Lung Cancer Screening: (Low Dose CT Chest recommended if Age 43-80 years, 30 pack-year currently smoking OR have quit w/in 15years.) does not qualify.    Lung Cancer Screening Referral: n/a  Additional Screening:  Hepatitis C Screening: does qualify;   Vision Screening: Recommended annual ophthalmology exams for early detection  of glaucoma and other disorders of the eye. Is the patient up to date with their annual eye exam?  Yes  Who is the provider or what is the name of the office in which the patient attends annual eye exams? Dr.Dunn  If pt is not established with a provider, would they like to be referred to a provider to establish care? No .   Dental Screening: Recommended annual dental exams for proper oral hygiene  Community Resource Referral / Chronic Care Management: CRR required this visit?  No   CCM required this visit?  No      Plan:     I have personally reviewed and noted the following in the patients chart:   Medical and social history Use of alcohol, tobacco or illicit drugs  Current medications and supplements including opioid prescriptions.  Functional ability and status Nutritional status Physical activity Advanced directives List of other physicians Hospitalizations, surgeries, and ER visits in previous 12 months Vitals Screenings to include cognitive, depression, and falls Referrals and appointments  In addition, I have reviewed and discussed with patient certain preventive protocols, quality metrics, and best practice recommendations. A written personalized care plan for preventive services as well as general preventive health recommendations were provided to patient.     Randel Pigg, LPN   [id number]   Nurse Notes: none

## 2021-05-23 NOTE — Patient Instructions (Signed)
Lori Allison , Thank you for taking time to come for your Medicare Wellness Visit. I appreciate your ongoing commitment to your health goals. Please review the following plan we discussed and let me know if I can assist you in the future.   Screening recommendations/referrals: Colonoscopy: no longer required Mammogram: no longer required  Bone Density: 10/28/2019 Recommended yearly ophthalmology/optometry visit for glaucoma screening and checkup Recommended yearly dental visit for hygiene and checkup  Vaccinations: Influenza vaccine: completed  Pneumococcal vaccine: due  Tdap vaccine: due  Shingles vaccine: will consider     Advanced directives: none   Conditions/risks identified: none   Next appointment: none    Preventive Care 65 Years and Older, Female Preventive care refers to lifestyle choices and visits with your health care provider that can promote health and wellness. What does preventive care include? A yearly physical exam. This is also called an annual well check. Dental exams once or twice a year. Routine eye exams. Ask your health care provider how often you should have your eyes checked. Personal lifestyle choices, including: Daily care of your teeth and gums. Regular physical activity. Eating a healthy diet. Avoiding tobacco and drug use. Limiting alcohol use. Practicing safe sex. Taking low-dose aspirin every day. Taking vitamin and mineral supplements as recommended by your health care provider. What happens during an annual well check? The services and screenings done by your health care provider during your annual well check will depend on your age, overall health, lifestyle risk factors, and family history of disease. Counseling  Your health care provider may ask you questions about your: Alcohol use. Tobacco use. Drug use. Emotional well-being. Home and relationship well-being. Sexual activity. Eating habits. History of falls. Memory and ability to  understand (cognition). Work and work Astronomer. Reproductive health. Screening  You may have the following tests or measurements: Height, weight, and BMI. Blood pressure. Lipid and cholesterol levels. These may be checked every 5 years, or more frequently if you are over 61 years old. Skin check. Lung cancer screening. You may have this screening every year starting at age 23 if you have a 30-pack-year history of smoking and currently smoke or have quit within the past 15 years. Fecal occult blood test (FOBT) of the stool. You may have this test every year starting at age 53. Flexible sigmoidoscopy or colonoscopy. You may have a sigmoidoscopy every 5 years or a colonoscopy every 10 years starting at age 32. Hepatitis C blood test. Hepatitis B blood test. Sexually transmitted disease (STD) testing. Diabetes screening. This is done by checking your blood sugar (glucose) after you have not eaten for a while (fasting). You may have this done every 1-3 years. Bone density scan. This is done to screen for osteoporosis. You may have this done starting at age 36. Mammogram. This may be done every 1-2 years. Talk to your health care provider about how often you should have regular mammograms. Talk with your health care provider about your test results, treatment options, and if necessary, the need for more tests. Vaccines  Your health care provider may recommend certain vaccines, such as: Influenza vaccine. This is recommended every year. Tetanus, diphtheria, and acellular pertussis (Tdap, Td) vaccine. You may need a Td booster every 10 years. Zoster vaccine. You may need this after age 22. Pneumococcal 13-valent conjugate (PCV13) vaccine. One dose is recommended after age 83. Pneumococcal polysaccharide (PPSV23) vaccine. One dose is recommended after age 15. Talk to your health care provider about which screenings and  vaccines you need and how often you need them. This information is not  intended to replace advice given to you by your health care provider. Make sure you discuss any questions you have with your health care provider. Document Released: 03/31/2015 Document Revised: 11/22/2015 Document Reviewed: 01/03/2015 Elsevier Interactive Patient Education  2017 [employer] Prevention in the Home Falls can cause injuries. They can happen to people of all ages. There are many things you can do to make your home safe and to help prevent falls. What can I do on the outside of my home? Regularly fix the edges of walkways and driveways and fix any cracks. Remove anything that might make you trip as you walk through a door, such as a raised step or threshold. Trim any bushes or trees on the path to your home. Use bright outdoor lighting. Clear any walking paths of anything that might make someone trip, such as rocks or tools. Regularly check to see if handrails are loose or broken. Make sure that both sides of any steps have handrails. Any raised decks and porches should have guardrails on the edges. Have any leaves, snow, or ice cleared regularly. Use sand or salt on walking paths during winter. Clean up any spills in your garage right away. This includes oil or grease spills. What can I do in the bathroom? Use night lights. Install grab bars by the toilet and in the tub and shower. Do not use towel bars as grab bars. Use non-skid mats or decals in the tub or shower. If you need to sit down in the shower, use a plastic, non-slip stool. Keep the floor dry. Clean up any water that spills on the floor as soon as it happens. Remove soap buildup in the tub or shower regularly. Attach bath mats securely with double-sided non-slip rug tape. Do not have throw rugs and other things on the floor that can make you trip. What can I do in the bedroom? Use night lights. Make sure that you have a light by your bed that is easy to reach. Do not use any sheets or blankets that are  too big for your bed. They should not hang down onto the floor. Have a firm chair that has side arms. You can use this for support while you get dressed. Do not have throw rugs and other things on the floor that can make you trip. What can I do in the kitchen? Clean up any spills right away. Avoid walking on wet floors. Keep items that you use a lot in easy-to-reach places. If you need to reach something above you, use a strong step stool that has a grab bar. Keep electrical cords out of the way. Do not use floor polish or wax that makes floors slippery. If you must use wax, use non-skid floor wax. Do not have throw rugs and other things on the floor that can make you trip. What can I do with my stairs? Do not leave any items on the stairs. Make sure that there are handrails on both sides of the stairs and use them. Fix handrails that are broken or loose. Make sure that handrails are as long as the stairways. Check any carpeting to make sure that it is firmly attached to the stairs. Fix any carpet that is loose or worn. Avoid having throw rugs at the top or bottom of the stairs. If you do have throw rugs, attach them to the floor with carpet tape. Make  sure that you have a light switch at the top of the stairs and the bottom of the stairs. If you do not have them, ask someone to add them for you. What else can I do to help prevent falls? Wear shoes that: Do not have high heels. Have rubber bottoms. Are comfortable and fit you well. Are closed at the toe. Do not wear sandals. If you use a stepladder: Make sure that it is fully opened. Do not climb a closed stepladder. Make sure that both sides of the stepladder are locked into place. Ask someone to hold it for you, if possible. Clearly mark and make sure that you can see: Any grab bars or handrails. First and last steps. Where the edge of each step is. Use tools that help you move around (mobility aids) if they are needed. These  include: Canes. Walkers. Scooters. Crutches. Turn on the lights when you go into a dark area. Replace any light bulbs as soon as they burn out. Set up your furniture so you have a clear path. Avoid moving your furniture around. If any of your floors are uneven, fix them. If there are any pets around you, be aware of where they are. Review your medicines with your doctor. Some medicines can make you feel dizzy. This can increase your chance of falling. Ask your doctor what other things that you can do to help prevent falls. This information is not intended to replace advice given to you by your health care provider. Make sure you discuss any questions you have with your health care provider. Document Released: 12/29/2008 Document Revised: 08/10/2015 Document Reviewed: 04/08/2014 Elsevier Interactive Patient Education  2017 [employer].

## 2021-06-12 ENCOUNTER — Encounter: Payer: Self-pay | Admitting: Family Medicine

## 2021-06-12 ENCOUNTER — Ambulatory Visit (INDEPENDENT_AMBULATORY_CARE_PROVIDER_SITE_OTHER): Payer: Medicare Other | Admitting: Family Medicine

## 2021-06-12 VITALS — BP 130/70 | HR 61 | Temp 97.1°F | Ht 63.0 in | Wt 139.0 lb

## 2021-06-12 DIAGNOSIS — I10 Essential (primary) hypertension: Secondary | ICD-10-CM

## 2021-06-12 DIAGNOSIS — E7800 Pure hypercholesterolemia, unspecified: Secondary | ICD-10-CM

## 2021-06-12 DIAGNOSIS — F418 Other specified anxiety disorders: Secondary | ICD-10-CM

## 2021-06-12 LAB — LIPID PANEL
Cholesterol: 156 mg/dL (ref 0–200)
HDL: 63.4 mg/dL (ref >39.00)
LDL Cholesterol: 74 mg/dL (ref 0–99)
NonHDL: 92.31
Total CHOL/HDL Ratio: 2
Triglycerides: 94 mg/dL (ref 0.0–149.0)
VLDL: 18.8 mg/dL (ref 0.0–40.0)

## 2021-06-12 LAB — COMPREHENSIVE METABOLIC PANEL
ALT: 21 U/L (ref 0–35)
AST: 25 U/L (ref 0–37)
Albumin: 4.2 g/dL (ref 3.5–5.2)
Alkaline Phosphatase: 94 U/L (ref 39–117)
BUN: 11 mg/dL (ref 6–23)
CO2: 27 mEq/L (ref 19–32)
Calcium: 8.9 mg/dL (ref 8.4–10.5)
Chloride: 103 mEq/L (ref 96–112)
Creatinine, Ser: 0.69 mg/dL (ref 0.40–1.20)
GFR: 84.6 mL/min (ref >60.00)
Glucose, Bld: 100 mg/dL — ABNORMAL HIGH (ref 70–99)
Potassium: 4.4 mEq/L (ref 3.5–5.1)
Sodium: 138 mEq/L (ref 135–145)
Total Bilirubin: 0.4 mg/dL (ref 0.2–1.2)
Total Protein: 7 g/dL (ref 6.0–8.3)

## 2021-06-12 LAB — CBC
HCT: 43.7 % (ref 36.0–46.0)
Hemoglobin: 14.3 g/dL (ref 12.0–15.0)
MCHC: 32.9 g/dL (ref 30.0–36.0)
MCV: 91.1 fl (ref 78.0–100.0)
Platelets: 309 10*3/uL (ref 150.0–400.0)
RBC: 4.79 Mil/uL (ref 3.87–5.11)
RDW: 13.3 % (ref 11.5–15.5)
WBC: 8.7 10*3/uL (ref 4.0–10.5)

## 2021-06-12 NOTE — Progress Notes (Signed)
? ?Established Patient Office Visit ? ?Subjective:  ?Patient ID: Lori Allison, female    DOB: 1946/03/16  Age: 76 y.o. MRN: [id number] ? ?CC:  ?Chief Complaint  ?Patient presents with  ? Follow-up  ?  6 month follow up. No concerns pt is fasting.  ? ? ?HPI ?Lori Allison presents for follow-up of hypertension, elevated cholesterol, anxiety with depression.  Stress persists with her mom mother's declining health and memory.  She may have a grandson moving in with her.  He is 50 and jobless.  Blood pressures been well controlled with metoprolol.  Continues simvastatin and Zetia for cholesterol.  Severe osteoarthritis of both knees.  Difficult for her to walk much and exercise. ? ?Past Medical History:  ?Diagnosis Date  ? Chicken pox   ? Hay fever   ? Hyperlipidemia   ? Hypertension   ? UTI (urinary tract infection)   ? ? ?Past Surgical History:  ?Procedure Laterality Date  ? EYE SURGERY Bilateral 07/17/2018  ? ? ?Family History  ?Problem Relation Age of Onset  ? High blood pressure Mother   ? Breast cancer Mother   ? Heart disease Father   ? High blood pressure Sister   ? Early death Brother   ? ? ?Social History  ? ?Socioeconomic History  ? Marital status: Divorced  ?  Spouse name: Not on file  ? Number of children: Not on file  ? Years of education: Not on file  ? Highest education level: Not on file  ?Occupational History  ? Occupation: retired  ?Tobacco Use  ? Smoking status: Former  ?  Types: Cigarettes  ?  Quit date: 2008  ?  Years since quitting: 15.2  ? Smokeless tobacco: Former  ?Substance and Sexual Activity  ? Alcohol use: Not Currently  ? Drug use: Never  ? Sexual activity: Not on file  ?Other Topics Concern  ? Not on file  ?Social History Narrative  ? Not on file  ? ?Social Determinants of Health  ? ?Financial Resource Strain: Low Risk   ? Difficulty of Paying Living Expenses: Not hard at all  ?Food Insecurity: No Food Insecurity  ? Worried About Charity fundraiser in the Last Year: Never true  ? Ran Out of  Food in the Last Year: Never true  ?Transportation Needs: No Transportation Needs  ? Lack of Transportation (Medical): No  ? Lack of Transportation (Non-Medical): No  ?Physical Activity: Insufficiently Active  ? Days of Exercise per Week: 3 days  ? Minutes of Exercise per Session: 30 min  ?Stress: Not on file  ?Social Connections: Moderately Isolated  ? Frequency of Communication with Friends and Family: Twice a week  ? Frequency of Social Gatherings with Friends and Family: Twice a week  ? Attends Religious Services: More than 4 times per year  ? Active Member of Clubs or Organizations: No  ? Attends Archivist Meetings: Never  ? Marital Status: Divorced  ?Intimate Partner Violence: Not At Risk  ? Fear of Current or Ex-Partner: No  ? Emotionally Abused: No  ? Physically Abused: No  ? Sexually Abused: No  ? ? ?Outpatient Medications Prior to Visit  ?Medication Sig Dispense Refill  ? alendronate (FOSAMAX) 70 MG tablet TAKE 1 TABLET(70 MG) BY MOUTH EVERY 7 DAYS WITH A FULL GLASS OF WATER AND ON AN EMPTY STOMACH 12 tablet 4  ? ALPRAZolam (XANAX) 0.5 MG tablet Take 1 tablet (0.5 mg total) by mouth at bedtime. As needed.  30 tablet 1  ? aspirin EC 81 MG tablet Take 1 tablet (81 mg total) by mouth daily. 365 tablet 1  ? azelastine (ASTELIN) 0.1 % nasal spray Place 1 spray into both nostrils 2 (two) times daily. As needed for post nasal drip 30 mL 12  ? Calcium Carbonate-Vitamin D (OSCAL 500/200 D-3 PO) Take 1 tablet by mouth daily.    ? diclofenac sodium (VOLTAREN) 1 % GEL Apply 2 g topically 3 (three) times daily as needed. 100 g 1  ? escitalopram (LEXAPRO) 20 MG tablet TAKE 1 TABLET(20 MG) BY MOUTH DAILY 90 tablet 1  ? ezetimibe (ZETIA) 10 MG tablet Take 1 tablet (10 mg total) by mouth daily. 90 tablet 3  ? metoprolol succinate (TOPROL-XL) 25 MG 24 hr tablet TAKE 1 TABLET(25 MG) BY MOUTH DAILY 90 tablet 0  ? Multiple Vitamin (MULTIVITAMIN) tablet Take 1 tablet by mouth daily.    ? omega-3 acid ethyl esters  (LOVAZA) 1 g capsule Take by mouth daily.    ? simvastatin (ZOCOR) 40 MG tablet Take 1 tablet (40 mg total) by mouth at bedtime. 90 tablet 3  ? ?No facility-administered medications prior to visit.  ? ? ?No Known Allergies ? ?ROS ?Review of Systems  ?Constitutional:  Negative for chills, diaphoresis, fatigue, fever and unexpected weight change.  ?HENT: Negative.    ?Eyes:  Negative for photophobia and visual disturbance.  ?Respiratory: Negative.    ?Cardiovascular: Negative.   ?Gastrointestinal: Negative.   ?Endocrine: Negative for polyphagia and polyuria.  ?Genitourinary: Negative.   ?Musculoskeletal:  Positive for arthralgias.  ?Neurological:  Negative for speech difficulty and weakness.  ?Psychiatric/Behavioral: Negative.    ? ?  ?Objective:  ?  ?Physical Exam ?Vitals and nursing note reviewed.  ?Constitutional:   ?   General: She is not in acute distress. ?   Appearance: Normal appearance. She is not ill-appearing, toxic-appearing or diaphoretic.  ?HENT:  ?   Head: Normocephalic and atraumatic.  ?   Right Ear: External ear normal.  ?   Left Ear: External ear normal.  ?   Mouth/Throat:  ?   Mouth: Mucous membranes are moist.  ?   Pharynx: Oropharynx is clear. No oropharyngeal exudate or posterior oropharyngeal erythema.  ?Eyes:  ?   General: No scleral icterus.    ?   Right eye: No discharge.     ?   Left eye: No discharge.  ?   Extraocular Movements: Extraocular movements intact.  ?   Conjunctiva/sclera: Conjunctivae normal.  ?   Pupils: Pupils are equal, round, and reactive to light.  ?Neck:  ?   Vascular: No carotid bruit.  ?Cardiovascular:  ?   Rate and Rhythm: Normal rate and regular rhythm.  ?Pulmonary:  ?   Effort: Pulmonary effort is normal.  ?   Breath sounds: Normal breath sounds.  ?Abdominal:  ?   General: Bowel sounds are normal.  ?Musculoskeletal:  ?   Cervical back: No rigidity or tenderness.  ?Lymphadenopathy:  ?   Cervical: No cervical adenopathy.  ?Skin: ?   General: Skin is warm and dry.   ?Neurological:  ?   Mental Status: She is alert and oriented to person, place, and time.  ?Psychiatric:     ?   Mood and Affect: Mood normal.     ?   Behavior: Behavior normal.  ? ? ?BP 130/70 (BP Location: Left Arm, Patient Position: Sitting, Cuff Size: Normal)   Pulse 61   Temp (!) 97.1 ?F (36.2 ?  C) (Temporal)   Ht 5\' 3"  (1.6 m)   Wt 139 lb (63 kg)   SpO2 98%   BMI 24.62 kg/m?  ?Wt Readings from Last 3 Encounters:  ?06/12/21 139 lb (63 kg)  ?04/06/21 136 lb (61.7 kg)  ?12/25/20 136 lb 6.4 oz (61.9 kg)  ? ? ? ?Health Maintenance Due  ?Topic Date Due  ? Hepatitis C Screening  Never done  ? TETANUS/TDAP  Never done  ? Zoster Vaccines- Shingrix (1 of 2) Never done  ? Pneumonia Vaccine 34+ Years old (2 - PCV) 05/24/2019  ? ? ?There are no preventive care reminders to display for this patient. ? ?Lab Results  ?Component Value Date  ? TSH 2.10 07/14/2019  ? ?Lab Results  ?Component Value Date  ? WBC 10.5 07/12/2020  ? HGB 15.6 (H) 07/12/2020  ? HCT 46.6 (H) 07/12/2020  ? MCV 90.9 07/12/2020  ? PLT 292.0 07/12/2020  ? ?Lab Results  ?Component Value Date  ? NA 139 12/13/2020  ? K 4.2 12/13/2020  ? CO2 27 12/13/2020  ? GLUCOSE 101 (H) 12/13/2020  ? BUN 13 12/13/2020  ? CREATININE 0.63 12/13/2020  ? BILITOT 0.5 12/13/2020  ? ALKPHOS 92 12/13/2020  ? AST 16 12/13/2020  ? ALT 13 12/13/2020  ? PROT 6.3 12/13/2020  ? ALBUMIN 4.0 12/13/2020  ? CALCIUM 8.8 12/13/2020  ? GFR 86.77 12/13/2020  ? ?Lab Results  ?Component Value Date  ? CHOL 179 12/13/2020  ? ?Lab Results  ?Component Value Date  ? HDL 65.40 12/13/2020  ? ?Lab Results  ?Component Value Date  ? Winston 94 12/13/2020  ? ?Lab Results  ?Component Value Date  ? TRIG 98.0 12/13/2020  ? ?Lab Results  ?Component Value Date  ? CHOLHDL 3 12/13/2020  ? ?No results found for: HGBA1C ? ?  ?Assessment & Plan:  ? ?Problem List Items Addressed This Visit   ? ?  ? Cardiovascular and Mediastinum  ? Essential hypertension - Primary  ? Relevant Orders  ? CBC  ? Comprehensive  metabolic panel  ?  ? Other  ? Elevated LDL cholesterol level  ? Relevant Orders  ? Lipid panel  ? Depression with anxiety  ? ? ?No orders of the defined types were placed in this encounter. ?Continue all medicines as above.

## 2021-06-20 ENCOUNTER — Other Ambulatory Visit: Payer: Self-pay | Admitting: Family Medicine

## 2021-06-20 DIAGNOSIS — I10 Essential (primary) hypertension: Secondary | ICD-10-CM

## 2021-06-20 DIAGNOSIS — I6523 Occlusion and stenosis of bilateral carotid arteries: Secondary | ICD-10-CM

## 2021-06-20 DIAGNOSIS — I739 Peripheral vascular disease, unspecified: Secondary | ICD-10-CM

## 2021-06-28 ENCOUNTER — Other Ambulatory Visit: Payer: Self-pay | Admitting: Family Medicine

## 2021-06-28 DIAGNOSIS — F419 Anxiety disorder, unspecified: Secondary | ICD-10-CM

## 2021-07-02 ENCOUNTER — Ambulatory Visit (INDEPENDENT_AMBULATORY_CARE_PROVIDER_SITE_OTHER): Payer: Medicare Other | Admitting: Family Medicine

## 2021-07-02 ENCOUNTER — Ambulatory Visit: Payer: Self-pay

## 2021-07-02 VITALS — BP 142/98 | HR 67 | Ht 63.0 in | Wt 139.6 lb

## 2021-07-02 DIAGNOSIS — M25562 Pain in left knee: Secondary | ICD-10-CM | POA: Diagnosis not present

## 2021-07-02 DIAGNOSIS — M545 Low back pain, unspecified: Secondary | ICD-10-CM

## 2021-07-02 DIAGNOSIS — M25561 Pain in right knee: Secondary | ICD-10-CM

## 2021-07-02 DIAGNOSIS — M170 Bilateral primary osteoarthritis of knee: Secondary | ICD-10-CM | POA: Diagnosis not present

## 2021-07-02 DIAGNOSIS — G8929 Other chronic pain: Secondary | ICD-10-CM

## 2021-07-02 MED ORDER — TIZANIDINE HCL 2 MG PO TABS: 2.0000 mg | ORAL_TABLET | Freq: Three times a day (TID) | ORAL | 1 refills | Status: DC | PRN

## 2021-07-02 NOTE — Patient Instructions (Signed)
Thank you for coming in today.  ? ?We will give you a call when the Arletta Bale is in stock. ?

## 2021-07-02 NOTE — Progress Notes (Signed)
? ?I, Philbert Riser, LAT, ATC acting as a scribe for [name], MD. ? ?Lori Allison is a 76 y.o. female who presents to [facility] at [facility] today for continued bilat knee pain due to DJD . Pt had bilat Durolane injections on 07/10/20. Pt was last seen by Dr. Denyse Amass on 12/25/20 and was given bilat knee steroid injections. Today, pt reports lasting relief from prior steroid injection. She has May 15 she has a beach trip and she would like to be able to enjoy. Pt notes her knees will sometimes. ? ?She also has left low back pain.  This is an acute exacerbation of a chronic issue.  Pain has been ongoing for few days now.  She cannot recall a specific injury that might of caused this however she does frequently do some gardening and lifting and bending.  She denies any pain radiating down her leg.  No bowel or bladder dysfunction. ? ?Dx imaging: 12/25/20 R & L knee XR ?12/31/17 L knee MRI ? ?Pertinent review of systems: no fever or chills ? ?Relevant historical information: Peripheral vascular disease. ? ? ?Exam:  ?BP (!) 142/98   Pulse 67   Ht 5\' 3"  (1.6 m)   Wt 139 lb 9.6 oz (63.3 kg)   SpO2 98%   BMI 24.73 kg/m?  ?General: Well Developed, well nourished, and in no acute distress.  ? ?MSK:  ?L-spine: Nontender midline. ?Tender palpation mildly left lower lumbar paraspinal musculature. ?Normal gait. ? ?Right knee mild effusion normal motion with crepitation. ? ?Left knee : Mild effusion normal motion with crepitation. ? ? ? ?Lab and Radiology Results ? ?EXAM: ?RIGHT KNEE 3 VIEWS ?  ?COMPARISON:  None. ?  ?FINDINGS: ?No evidence of an acute fracture or dislocation. Moderate to marked ?severity medial and lateral tibiofemoral compartment space narrowing ?is seen. Marked severity patellofemoral narrowing is also noted. ?Periarticular sclerosis is seen with mild medial marginal osteophyte ?formation. There is a small joint effusion. ?  ?IMPRESSION: ?1. Moderate to marked severity  tricompartmental degenerative ?changes. ?2. Small joint effusion. ?  ?  ?Electronically Signed ?  By: M.D. ?  On: 12/26/2020 23:10 ?EXAM: ?LEFT KNEE 3 VIEWS ?  ?COMPARISON:  None. ?  ?FINDINGS: ?No evidence of an acute fracture or dislocation. Marginal ?osteophytes are seen along the medial aspect of the left knee. ?Marked severity medial tibiofemoral compartment space narrowing is ?seen. Moderate severity lateral tibiofemoral and patellofemoral ?compartment space narrowing is also seen. Periarticular sclerosis ?and subchondral cysts are noted. There is a small joint effusion. ?  ?IMPRESSION: ?1. 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 ?2. Small joint effusion. ?  ?  ?Electronically Signed ?  By: 02/25/2021 M.D. ?  On: 12/26/2020 23:08 ?  ?I, 02/25/2021, personally (independently) visualized and performed the interpretation of the images attached in this note. ? ? ?Assessment and Plan: ?76 y.o. female with bilateral knee pain due to DJD.  Patient would like to proceed with Zilretta injections.  She has had pretty good benefit from steroid injections but unfortunately did not last as long as she would like (only lasting 3-4 months).  She previously had hyaluronic acid injections which did not help very much. ? ?She will return in the near future for Zilretta injections once they are authorized and available. ? ?As for her left low back pain thought to be due to myofascial spasm and dysfunction.  Plan for limited tizanidine.  Recommend heat and continued activity.  If not doing well or if worsening will refer to physical therapy. ? ? ?PDMP not reviewed this encounter. ?No orders of the defined types were placed in this encounter. ? ?Meds ordered this encounter  ?Medications  ? tiZANidine (ZANAFLEX) 2 MG tablet  ?  Sig: Take 1-2 tablets (2-4 mg total) by mouth every 8 (eight) hours as needed for muscle spasms.  ?  Dispense:  60 tablet  ?  Refill:  1  ? ? ? ?Discussed warning  signs or symptoms. Please see discharge instructions. Patient expresses understanding. ? ? ?The above documentation has been reviewed and is accurate and complete [name], M.D. ? ? ?

## 2021-07-09 ENCOUNTER — Ambulatory Visit: Payer: Self-pay

## 2021-07-09 ENCOUNTER — Ambulatory Visit (INDEPENDENT_AMBULATORY_CARE_PROVIDER_SITE_OTHER): Payer: Medicare Other | Admitting: Family Medicine

## 2021-07-09 DIAGNOSIS — M170 Bilateral primary osteoarthritis of knee: Secondary | ICD-10-CM

## 2021-07-09 DIAGNOSIS — M25562 Pain in left knee: Secondary | ICD-10-CM | POA: Diagnosis not present

## 2021-07-09 DIAGNOSIS — M25561 Pain in right knee: Secondary | ICD-10-CM | POA: Diagnosis not present

## 2021-07-09 DIAGNOSIS — G8929 Other chronic pain: Secondary | ICD-10-CM

## 2021-07-09 NOTE — Progress Notes (Signed)
Lori Allison presents to clinic today for Zilretta injection bilateral knees ? ?Procedure: Real-time Ultrasound Guided Injection of right knee superior lateral patellar space ?Device: Philips Affiniti 50G ?Images permanently stored and available for review in PACS ?Verbal informed consent obtained.  Discussed risks and benefits of procedure. Warned about infection, bleeding, hyperglycemia damage to structures among others. ?Patient expresses understanding and agreement ?Time-out conducted.   ?Noted no overlying erythema, induration, or other signs of local infection.   ?Skin prepped in a sterile fashion.   ?Local anesthesia: Topical Ethyl chloride.   ?With sterile technique and under real time ultrasound guidance: Zilretta 32 mg injected into knee joint. Fluid seen entering the joint capsule.   ?Completed without difficulty     ?Advised to call if fevers/chills, erythema, induration, drainage, or persistent bleeding.   ?Images permanently stored and available for review in the ultrasound unit.  ?Impression: Technically successful ultrasound guided injection. ? ? ? ?Procedure: Real-time Ultrasound Guided Injection of left knee superior lateral patellar space ?Device: Philips Affiniti 50G ?Images permanently stored and available for review in PACS ?Verbal informed consent obtained.  Discussed risks and benefits of procedure. Warned about infection, bleeding, hyperglycemia damage to structures among others. ?Patient expresses understanding and agreement ?Time-out conducted.   ?Noted no overlying erythema, induration, or other signs of local infection.   ?Skin prepped in a sterile fashion.   ?Local anesthesia: Topical Ethyl chloride.   ?With sterile technique and under real time ultrasound guidance: Zilretta 32 mg injected into knee joint. Fluid seen entering the joint capsule.   ?Completed without difficulty   ?Advised to call if fevers/chills, erythema, induration, drainage, or persistent bleeding.   ?Images permanently  stored and available for review in the ultrasound unit.  ?Impression: Technically successful ultrasound guided injection. ? ? ? ?Lot number: [id number] for both injections ?

## 2021-07-09 NOTE — Patient Instructions (Signed)
Thank you for coming in today.  ? ?You received Zilretta injection in both of your knees today. Seek immediate medical attention if the joint becomes red, extremely painful, or is oozing fluid.  ? ?Have fun at the beach!! ? ?Recheck back as needed ?

## 2021-07-17 ENCOUNTER — Other Ambulatory Visit: Payer: Self-pay | Admitting: Family Medicine

## 2021-07-17 DIAGNOSIS — F419 Anxiety disorder, unspecified: Secondary | ICD-10-CM

## 2021-09-18 ENCOUNTER — Other Ambulatory Visit: Payer: Self-pay | Admitting: Family Medicine

## 2021-09-18 DIAGNOSIS — I10 Essential (primary) hypertension: Secondary | ICD-10-CM

## 2021-09-18 DIAGNOSIS — I6523 Occlusion and stenosis of bilateral carotid arteries: Secondary | ICD-10-CM

## 2021-09-18 DIAGNOSIS — I739 Peripheral vascular disease, unspecified: Secondary | ICD-10-CM

## 2021-09-25 ENCOUNTER — Other Ambulatory Visit: Payer: Self-pay | Admitting: Family Medicine

## 2021-09-25 DIAGNOSIS — I739 Peripheral vascular disease, unspecified: Secondary | ICD-10-CM

## 2021-09-25 DIAGNOSIS — E7800 Pure hypercholesterolemia, unspecified: Secondary | ICD-10-CM

## 2021-11-14 ENCOUNTER — Other Ambulatory Visit: Payer: Self-pay | Admitting: Family Medicine

## 2021-11-14 DIAGNOSIS — F419 Anxiety disorder, unspecified: Secondary | ICD-10-CM

## 2021-11-14 NOTE — Telephone Encounter (Signed)
Refill request for  Alprazolam 0.5 mg  LR 07/17/21, #30, 0 rf LOV 06/12/21 FOV 12/11/21   Please review and advise.  Thanks.  Dm/cma

## 2021-11-23 ENCOUNTER — Ambulatory Visit: Payer: Self-pay

## 2021-11-23 ENCOUNTER — Ambulatory Visit (INDEPENDENT_AMBULATORY_CARE_PROVIDER_SITE_OTHER): Payer: Medicare Other | Admitting: Family Medicine

## 2021-11-23 VITALS — BP 142/88 | HR 70 | Ht 63.0 in | Wt 140.0 lb

## 2021-11-23 DIAGNOSIS — G8929 Other chronic pain: Secondary | ICD-10-CM

## 2021-11-23 DIAGNOSIS — M25561 Pain in right knee: Secondary | ICD-10-CM

## 2021-11-23 DIAGNOSIS — M25562 Pain in left knee: Secondary | ICD-10-CM

## 2021-11-23 DIAGNOSIS — M170 Bilateral primary osteoarthritis of knee: Secondary | ICD-10-CM | POA: Diagnosis not present

## 2021-11-23 MED ORDER — TRIAMCINOLONE ACETONIDE 32 MG IX SRER
64.0000 mg | Freq: Once | INTRA_ARTICULAR | Status: AC
  Administered 2021-11-23: 64 mg via INTRA_ARTICULAR

## 2021-11-23 NOTE — Patient Instructions (Signed)
Thank you for coming in today.   You received an injection today. Seek immediate medical attention if the joint becomes red, extremely painful, or is oozing fluid.   Start thinking about the next step and having a knee replacement  Check back as needed

## 2021-11-23 NOTE — Progress Notes (Signed)
I, Philbert Riser, LAT, ATC acting as a scribe for [name], MD.  Lori Allison is a 76 y.o. female who presents to [facility] at [facility] today for chronic bilat knee pain. Pt was last seen by Dr. Denyse Amass on 07/09/21 and had bilat Zilretta injections. Today, pt reports both knees started hurting again about 1 month ago.  She notes that she does not think that she can have knee replacements at this time because she is caring for her 35+-year-old mother.  Dx imaging: 12/25/20 R & L knee XR 12/31/17 L knee MRI  Pertinent review of systems: No fevers or chills  Relevant historical information: Peripheral vascular disease.  Hypertension.  Osteoporosis.   Exam:  BP (!) 142/88   Pulse 70   Ht 5\' 3"  (1.6 m)   Wt 140 lb (63.5 kg)   SpO2 98%   BMI 24.80 kg/m  General: Well Developed, well nourished, and in no acute distress.   MSK: Right knee: Moderate joint effusion otherwise normal-appearing Normal motion with crepitation. Left knee normal-appearing mild joint effusion normal motion with crepitation.   Lab and Radiology Results  Procedure: Real-time Ultrasound Guided Injection of right knee superior lateral patellar space Device: Philips Affiniti 50G Images permanently stored and available for review in PACS Verbal informed consent obtained.  Discussed risks and benefits of procedure. Warned about infection, bleeding, hyperglycemia damage to structures among others. Patient expresses understanding and agreement Time-out conducted.   Noted no overlying erythema, induration, or other signs of local infection.   Skin prepped in a sterile fashion.   Local anesthesia: Topical Ethyl chloride.   With sterile technique and under real time ultrasound guidance: Zilretta 32 mg injected into the joint. Fluid seen entering the joint capsule.   Completed without difficulty   Advised to call if fevers/chills, erythema, induration, drainage, or persistent bleeding.   Images  permanently stored and available for review in the ultrasound unit.  Impression: Technically successful ultrasound guided injection.    Procedure: Real-time Ultrasound Guided Injection of left knee superior lateral patellar space Device: Philips Affiniti 50G Images permanently stored and available for review in PACS Verbal informed consent obtained.  Discussed risks and benefits of procedure. Warned about infection, bleeding, hyperglycemia damage to structures among others. Patient expresses understanding and agreement Time-out conducted.   Noted no overlying erythema, induration, or other signs of local infection.   Skin prepped in a sterile fashion.   Local anesthesia: Topical Ethyl chloride.   With sterile technique and under real time ultrasound guidance: Zilretta 32 mg injected into knee joint. Fluid seen entering the joint capsule.   Completed without difficulty   Advised to call if fevers/chills, erythema, induration, drainage, or persistent bleeding.   Images permanently stored and available for review in the ultrasound unit.  Impression: Technically successful ultrasound guided injection.  Lot number: [id number] for both injections      Assessment and Plan: 76 y.o. female with bilateral knee pain due to exacerbation of underlying significant knee DJD.  Plan for bilateral Zilretta injection.  Its been approximately 5 months since her last injection.  We can continue this for some time now.  However she ultimately will require total knee replacement.  Advised her to start thinking about the timeline for this.  I do not think she should wait too long if possible.   PDMP not reviewed this encounter. Orders Placed This Encounter  Procedures   73 LIMITED JOINT SPACE STRUCTURES LOW BILAT(NO LINKED CHARGES)  Order Specific Question:   Reason for Exam (SYMPTOM  OR DIAGNOSIS REQUIRED)    Answer:   bilateral knee pain    Order Specific Question:   Preferred imaging location?     Answer:   Adult nurse [facility] ordered this encounter  Medications   Triamcinolone Acetonide (ZILRETTA) intra-articular injection 64 mg     Discussed warning signs or symptoms. Please see discharge instructions. Patient expresses understanding.   The above documentation has been reviewed and is accurate and complete [name], M.D.

## 2021-12-04 ENCOUNTER — Other Ambulatory Visit: Payer: Self-pay | Admitting: Family Medicine

## 2021-12-04 DIAGNOSIS — E7800 Pure hypercholesterolemia, unspecified: Secondary | ICD-10-CM

## 2021-12-04 DIAGNOSIS — I6523 Occlusion and stenosis of bilateral carotid arteries: Secondary | ICD-10-CM

## 2021-12-11 ENCOUNTER — Ambulatory Visit (INDEPENDENT_AMBULATORY_CARE_PROVIDER_SITE_OTHER): Payer: Medicare Other | Admitting: Family Medicine

## 2021-12-11 ENCOUNTER — Encounter: Payer: Self-pay | Admitting: Family Medicine

## 2021-12-11 VITALS — BP 132/76 | HR 66 | Temp 97.7°F | Ht 63.0 in | Wt 134.6 lb

## 2021-12-11 DIAGNOSIS — Z23 Encounter for immunization: Secondary | ICD-10-CM | POA: Diagnosis not present

## 2021-12-11 DIAGNOSIS — I10 Essential (primary) hypertension: Secondary | ICD-10-CM

## 2021-12-11 DIAGNOSIS — F418 Other specified anxiety disorders: Secondary | ICD-10-CM

## 2021-12-11 DIAGNOSIS — E7800 Pure hypercholesterolemia, unspecified: Secondary | ICD-10-CM | POA: Diagnosis not present

## 2021-12-11 DIAGNOSIS — R0982 Postnasal drip: Secondary | ICD-10-CM | POA: Diagnosis not present

## 2021-12-11 LAB — URINALYSIS, ROUTINE W REFLEX MICROSCOPIC
Bilirubin Urine: NEGATIVE
Hgb urine dipstick: NEGATIVE
Ketones, ur: NEGATIVE
Nitrite: NEGATIVE
RBC / HPF: NONE SEEN (ref 0–?)
Specific Gravity, Urine: <=1.005 — AB (ref 1.000–1.030)
Total Protein, Urine: NEGATIVE
Urine Glucose: NEGATIVE
Urobilinogen, UA: 0.2 (ref 0.0–1.0)
pH: 6.5 (ref 5.0–8.0)

## 2021-12-11 LAB — COMPREHENSIVE METABOLIC PANEL
ALT: 25 U/L (ref 0–35)
AST: 22 U/L (ref 0–37)
Albumin: 4 g/dL (ref 3.5–5.2)
Alkaline Phosphatase: 65 U/L (ref 39–117)
BUN: 16 mg/dL (ref 6–23)
CO2: 30 mEq/L (ref 19–32)
Calcium: 9 mg/dL (ref 8.4–10.5)
Chloride: 104 mEq/L (ref 96–112)
Creatinine, Ser: 0.69 mg/dL (ref 0.40–1.20)
GFR: 84.3 mL/min (ref >60.00)
Glucose, Bld: 96 mg/dL (ref 70–99)
Potassium: 4.8 mEq/L (ref 3.5–5.1)
Sodium: 138 mEq/L (ref 135–145)
Total Bilirubin: 0.5 mg/dL (ref 0.2–1.2)
Total Protein: 6.7 g/dL (ref 6.0–8.3)

## 2021-12-11 LAB — LIPID PANEL
Cholesterol: 151 mg/dL (ref 0–200)
HDL: 70.3 mg/dL (ref >39.00)
LDL Cholesterol: 67 mg/dL (ref 0–99)
NonHDL: 80.36
Total CHOL/HDL Ratio: 2
Triglycerides: 65 mg/dL (ref 0.0–149.0)
VLDL: 13 mg/dL (ref 0.0–40.0)

## 2021-12-11 MED ORDER — AZELASTINE HCL 0.1 % NA SOLN: 1.0000 | Freq: Two times a day (BID) | NASAL | 12 refills | Status: AC

## 2021-12-11 NOTE — Progress Notes (Signed)
Established Patient Office Visit  Subjective   Patient ID: Lori Allison, female    DOB: [birth year]  Age: 76 y.o. MRN: [id number]  Chief Complaint  Patient presents with   Follow-up    6 month follow up, no concerns. Patient fasting.     HPI follow-up of hypertension, elevated cholesterol with carotid artery disease, anxiety with depression and postnasal drip.  Continues to struggle with severely arthritic knees.  Does not feel comfortable having her knees replaced as long as she is needed to care for her.  Blood pressure controlled with metoprolol alone.  Continues simvastatin and Zetia for control of cholesterol.  Continues Lexapro with apparent rare use of alprazolam.    Review of Systems  Constitutional: Negative.   HENT: Negative.    Eyes:  Negative for blurred vision, discharge and redness.  Respiratory: Negative.    Cardiovascular: Negative.   Gastrointestinal:  Negative for abdominal pain.  Genitourinary: Negative.   Musculoskeletal:  Positive for joint pain. Negative for myalgias.  Skin:  Negative for rash.  Neurological:  Negative for tingling, loss of consciousness and weakness.  Endo/Heme/Allergies:  Negative for polydipsia.      Objective:     BP 132/76 (BP Location: Left Arm, Patient Position: Sitting, Cuff Size: Normal)   Pulse 66   Temp 97.7 F (36.5 C) (Temporal)   Ht 5\' 3"  (1.6 m)   Wt 134 lb 9.6 oz (61.1 kg)   SpO2 99%   BMI 23.84 kg/m    Physical Exam Constitutional:      General: She is not in acute distress.    Appearance: Normal appearance. She is not ill-appearing, toxic-appearing or diaphoretic.  HENT:     Head: Normocephalic and atraumatic.     Right Ear: External ear normal.     Left Ear: External ear normal.     Mouth/Throat:     Mouth: Mucous membranes are moist.     Pharynx: Oropharynx is clear. No oropharyngeal exudate or posterior oropharyngeal erythema.  Eyes:     General: No scleral icterus.       Right eye: No discharge.         Left eye: No discharge.     Extraocular Movements: Extraocular movements intact.     Conjunctiva/sclera: Conjunctivae normal.     Pupils: Pupils are equal, round, and reactive to light.  Cardiovascular:     Rate and Rhythm: Normal rate and regular rhythm.  Pulmonary:     Effort: Pulmonary effort is normal. No respiratory distress.     Breath sounds: Normal breath sounds.  Musculoskeletal:     Cervical back: No rigidity or tenderness.  Lymphadenopathy:     Cervical: No cervical adenopathy.  Skin:    General: Skin is warm and dry.  Neurological:     Mental Status: She is alert and oriented to person, place, and time.  Psychiatric:        Mood and Affect: Mood normal.        Behavior: Behavior normal.      No results found for any visits on 12/11/21.    The 10-year ASCVD risk score (Arnett DK, et al., 2019) is: 24.4%    Assessment & Plan:   Problem List Items Addressed This Visit       Cardiovascular and Mediastinum   Essential hypertension - Primary   Relevant Orders   Comprehensive metabolic panel   Urinalysis, Routine w reflex microscopic     Other   Elevated LDL cholesterol  level   Relevant Orders   Comprehensive metabolic panel   Lipid panel   Need for influenza vaccination   Relevant Orders   Flu vaccine HIGH DOSE PF (Fluzone High dose)   Depression with anxiety   Other Visit Diagnoses     PND (post-nasal drip)       Relevant Medications   azelastine (ASTELIN) 0.1 % nasal spray   Need for vaccination against Streptococcus pneumoniae       Relevant Orders   Pneumococcal conjugate vaccine 20-valent (Prevnar 20)       Return in about 6 months (around 06/11/2022).  Continue current medications.  Courage to to have COVID and Shingrix vaccines.   Libby Maw, MD

## 2021-12-21 ENCOUNTER — Encounter: Payer: Self-pay | Admitting: Family Medicine

## 2021-12-25 ENCOUNTER — Other Ambulatory Visit: Payer: Self-pay | Admitting: Family Medicine

## 2021-12-25 DIAGNOSIS — F419 Anxiety disorder, unspecified: Secondary | ICD-10-CM

## 2022-01-02 ENCOUNTER — Telehealth: Payer: Self-pay | Admitting: Family Medicine

## 2022-01-02 NOTE — Telephone Encounter (Signed)
Please advise message below  °

## 2022-01-02 NOTE — Telephone Encounter (Signed)
Lori Allison tested positive as this morning for covid. I offered Lori Allison a my chart video appt but Lori Allison want to know if dr Ethelene Hal can call her in some medication

## 2022-01-03 NOTE — Telephone Encounter (Signed)
Spoke with patient who states that she feels a little better today and will hold off on anything agrees to call is if no improvement.

## 2022-02-23 ENCOUNTER — Other Ambulatory Visit: Payer: Self-pay | Admitting: Family Medicine

## 2022-02-23 DIAGNOSIS — M810 Age-related osteoporosis without current pathological fracture: Secondary | ICD-10-CM

## 2022-03-01 ENCOUNTER — Ambulatory Visit: Payer: Self-pay

## 2022-03-01 ENCOUNTER — Telehealth: Payer: Self-pay

## 2022-03-01 ENCOUNTER — Ambulatory Visit (INDEPENDENT_AMBULATORY_CARE_PROVIDER_SITE_OTHER): Payer: Medicare Other | Admitting: Family Medicine

## 2022-03-01 VITALS — BP 142/88 | HR 70 | Ht 63.0 in | Wt 135.0 lb

## 2022-03-01 DIAGNOSIS — M25561 Pain in right knee: Secondary | ICD-10-CM | POA: Diagnosis not present

## 2022-03-01 DIAGNOSIS — G8929 Other chronic pain: Secondary | ICD-10-CM | POA: Diagnosis not present

## 2022-03-01 DIAGNOSIS — M25562 Pain in left knee: Secondary | ICD-10-CM | POA: Diagnosis not present

## 2022-03-01 DIAGNOSIS — M170 Bilateral primary osteoarthritis of knee: Secondary | ICD-10-CM

## 2022-03-01 NOTE — Telephone Encounter (Signed)
Spoke to pt and she is wanted regular steroid injections in her knees as she has a lot of people coming over for the holidays and her knees have really been hurting her since Oct after having COVID.

## 2022-03-01 NOTE — Progress Notes (Signed)
I, Lori Allison, LAT, ATC acting as a scribe for Lori Graham, MD.  Lori Allison is a 76 y.o. female who presents to [facility] at [facility] today for bilateral knee pain.  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  Patient was last seen by Dr. Denyse Amass on 11/23/2021 and was given bilateral Zilretta injections.  Today, patient reports knee pain returning around the end of Oct. Pt's activity level has increased; climbing ladders. Pt is wanting to try a regular steroid injection to get her through Christmas.   Dx imaging: 12/25/20 R & L knee XR 12/31/17 L knee MRI  Pertinent review of systems: No fevers or chills  Relevant historical information: Hypertension.  Osteoporosis.  DJD knees bilaterally.   Exam:  BP (!) 142/88   Pulse 70   Ht 5\' 3"  (1.6 m)   Wt 135 lb (61.2 kg)   SpO2 98%   BMI 23.91 kg/m  General: Well Developed, well nourished, and in no acute distress.   MSK: Knees bilaterally mild effusion.  No erythema.  Normal motion with crepitation.    Lab and Radiology Results  Procedure: Real-time Ultrasound Guided Injection of right knee superior lateral patellar space Device: Philips Affiniti 50G Images permanently stored and available for review in PACS Verbal informed consent obtained.  Discussed risks and benefits of procedure. Warned about infection, bleeding, hyperglycemia damage to structures among others. Patient expresses understanding and agreement Time-out conducted.   Noted no overlying erythema, induration, or other signs of local infection.   Skin prepped in a sterile fashion.   Local anesthesia: Topical Ethyl chloride.   With sterile technique and under real time ultrasound guidance: 40 mg of Kenalog and 2 mL Marcaine injected into right knee joint. Fluid seen entering the joint capsule.   Completed without difficulty   Pain immediately resolved suggesting accurate placement of the medication.   Advised to call if  fevers/chills, erythema, induration, drainage, or persistent bleeding.   Images permanently stored and available for review in the ultrasound unit.  Impression: Technically successful ultrasound guided injection.    Procedure: Real-time Ultrasound Guided Injection of left knee superior lateral patellar space Device: Philips Affiniti 50G Images permanently stored and available for review in PACS Verbal informed consent obtained.  Discussed risks and benefits of procedure. Warned about infection, bleeding, hyperglycemia damage to structures among others. Patient expresses understanding and agreement Time-out conducted.   Noted no overlying erythema, induration, or other signs of local infection.   Skin prepped in a sterile fashion.   Local anesthesia: Topical Ethyl chloride.   With sterile technique and under real time ultrasound guidance: 40 mg of Kenalog and 2 mL of Marcaine injected into knee joint. Fluid seen entering the joint capsule.   Completed without difficulty   Pain immediately resolved suggesting accurate placement of the medication.   Advised to call if fevers/chills, erythema, induration, drainage, or persistent bleeding.   Images permanently stored and available for review in the ultrasound unit.  Impression: Technically successful ultrasound guided injection.       Assessment and Plan: 76 y.o. female with bilateral knee pain due to exacerbation of DJD.  Plan for conventional steroid injection bilateral knees.  Will attempt to authorize Zilretta for the new year.  Check back as needed.   PDMP not reviewed this encounter. Orders Placed This Encounter  Procedures   73 LIMITED JOINT SPACE STRUCTURES LOW BILAT(NO LINKED CHARGES)    Order Specific Question:   Reason for  Exam (SYMPTOM  OR DIAGNOSIS REQUIRED)    Answer:   Bilateral knee pain    Order Specific Question:   Preferred imaging location?    Answer:   [facility]   No orders of the  defined types were placed in this encounter.    Discussed warning signs or symptoms. Please see discharge instructions. Patient expresses understanding.   The above documentation has been reviewed and is accurate and complete Lori Allison, M.D.

## 2022-03-01 NOTE — Patient Instructions (Addendum)
Thank you for coming in today.   You received an injection today. Seek immediate medical attention if the joint becomes red, extremely painful, or is oozing fluid.   We will try to get zilretta authorized.

## 2022-03-06 ENCOUNTER — Other Ambulatory Visit: Payer: Self-pay | Admitting: Family Medicine

## 2022-03-06 DIAGNOSIS — F419 Anxiety disorder, unspecified: Secondary | ICD-10-CM

## 2022-03-06 NOTE — Telephone Encounter (Signed)
Refill request for  Alprazolam 0.5 mg LR 11/15/21, #30, 0 rf LOV 12/11/21 FOV 06/11/22  Please review and advise.  Thanks.Dm/cma

## 2022-03-20 ENCOUNTER — Telehealth: Payer: Self-pay | Admitting: *Deleted

## 2022-03-20 NOTE — Telephone Encounter (Signed)
Bilateral zilretta initiated through portal.  

## 2022-03-20 NOTE — Telephone Encounter (Signed)
-----   Message from Gregor Hams, MD sent at 03/01/2022 10:54 AM EST ----- Regarding: Orvan Seen auth BL knees Please auth Zilretta both knees

## 2022-03-21 NOTE — Telephone Encounter (Signed)
Bilateral Zilretta approved. No pre-cert required.  

## 2022-05-10 ENCOUNTER — Telehealth: Payer: Self-pay | Admitting: Family Medicine

## 2022-05-10 NOTE — Telephone Encounter (Signed)
Contacted Allene Pyo to schedule their annual wellness visit. Appointment made for 05/27/22.  [employer] [facility] direct phone # [phone]

## 2022-05-27 ENCOUNTER — Ambulatory Visit (INDEPENDENT_AMBULATORY_CARE_PROVIDER_SITE_OTHER): Payer: Medicare Other

## 2022-05-27 VITALS — BP 124/72 | HR 58 | Ht 63.0 in | Wt 136.4 lb

## 2022-05-27 DIAGNOSIS — Z0000 Encounter for general adult medical examination without abnormal findings: Secondary | ICD-10-CM | POA: Diagnosis not present

## 2022-05-27 NOTE — Patient Instructions (Signed)
Lori Allison , Thank you for taking time to come for your Medicare Wellness Visit. I appreciate your ongoing commitment to your health goals. Please review the following plan we discussed and let me know if I can assist you in the future.   These are the goals we discussed:  Goals      Increase physical activity     Patient Stated     Lose weight     Patient Stated     05/27/2022, wants to lose weight        This is a list of the screening recommended for you and due dates:  Health Maintenance  Topic Date Due   DTaP/Tdap/Td vaccine (1 - Tdap) Never done   Medicare Annual Wellness Visit  05/27/2050   Pneumonia Vaccine  Completed   Flu Shot  Completed   DEXA scan (bone density measurement)  Completed   HPV Vaccine  Aged Out   COVID-19 Vaccine  Discontinued   Hepatitis C Screening: USPSTF Recommendation to screen - Ages 66-79 yo.  Discontinued   Zoster (Shingles) Vaccine  Discontinued    Advanced directives: Advance directive discussed with you today.   Conditions/risks identified: none  Next appointment: Follow up in one year for your annual wellness visit    Preventive Care 65 Years and Older, Female Preventive care refers to lifestyle choices and visits with your health care provider that can promote health and wellness. What does preventive care include? A yearly physical exam. This is also called an annual well check. Dental exams once or twice a year. Routine eye exams. Ask your health care provider how often you should have your eyes checked. Personal lifestyle choices, including: Daily care of your teeth and gums. Regular physical activity. Eating a healthy diet. Avoiding tobacco and drug use. Limiting alcohol use. Practicing safe sex. Taking low-dose aspirin every day. Taking vitamin and mineral supplements as recommended by your health care provider. What happens during an annual well check? The services and screenings done by your health care provider during  your annual well check will depend on your age, overall health, lifestyle risk factors, and family history of disease. Counseling  Your health care provider may ask you questions about your: Alcohol use. Tobacco use. Drug use. Emotional well-being. Home and relationship well-being. Sexual activity. Eating habits. History of falls. Memory and ability to understand (cognition). Work and work Statistician. Reproductive health. Screening  You may have the following tests or measurements: Height, weight, and BMI. Blood pressure. Lipid and cholesterol levels. These may be checked every 5 years, or more frequently if you are over 60 years old. Skin check. Lung cancer screening. You may have this screening every year starting at age 20 if you have a 30-pack-year history of smoking and currently smoke or have quit within the past 15 years. Fecal occult blood test (FOBT) of the stool. You may have this test every year starting at age 70. Flexible sigmoidoscopy or colonoscopy. You may have a sigmoidoscopy every 5 years or a colonoscopy every 10 years starting at age 39. Hepatitis C blood test. Hepatitis B blood test. Sexually transmitted disease (STD) testing. Diabetes screening. This is done by checking your blood sugar (glucose) after you have not eaten for a while (fasting). You may have this done every 1-3 years. Bone density scan. This is done to screen for osteoporosis. You may have this done starting at age 80. Mammogram. This may be done every 1-2 years. Talk to your health care provider  about how often you should have regular mammograms. Talk with your health care provider about your test results, treatment options, and if necessary, the need for more tests. Vaccines  Your health care provider may recommend certain vaccines, such as: Influenza vaccine. This is recommended every year. Tetanus, diphtheria, and acellular pertussis (Tdap, Td) vaccine. You may need a Td booster every 10  years. Zoster vaccine. You may need this after age 7. Pneumococcal 13-valent conjugate (PCV13) vaccine. One dose is recommended after age 14. Pneumococcal polysaccharide (PPSV23) vaccine. One dose is recommended after age 62. Talk to your health care provider about which screenings and vaccines you need and how often you need them. This information is not intended to replace advice given to you by your health care provider. Make sure you discuss any questions you have with your health care provider. Document Released: 03/31/2015 Document Revised: 11/22/2015 Document Reviewed: 01/03/2015 Elsevier Interactive Patient Education  2017 [place] Prevention in the Home Falls can cause injuries. They can happen to people of all ages. There are many things you can do to make your home safe and to help prevent falls. What can I do on the outside of my home? Regularly fix the edges of walkways and driveways and fix any cracks. Remove anything that might make you trip as you walk through a door, such as a raised step or threshold. Trim any bushes or trees on the path to your home. Use bright outdoor lighting. Clear any walking paths of anything that might make someone trip, such as rocks or tools. Regularly check to see if handrails are loose or broken. Make sure that both sides of any steps have handrails. Any raised decks and porches should have guardrails on the edges. Have any leaves, snow, or ice cleared regularly. Use sand or salt on walking paths during winter. Clean up any spills in your garage right away. This includes oil or grease spills. What can I do in the bathroom? Use night lights. Install grab bars by the toilet and in the tub and shower. Do not use towel bars as grab bars. Use non-skid mats or decals in the tub or shower. If you need to sit down in the shower, use a plastic, non-slip stool. Keep the floor dry. Clean up any water that spills on the floor as soon as it  happens. Remove soap buildup in the tub or shower regularly. Attach bath mats securely with double-sided non-slip rug tape. Do not have throw rugs and other things on the floor that can make you trip. What can I do in the bedroom? Use night lights. Make sure that you have a light by your bed that is easy to reach. Do not use any sheets or blankets that are too big for your bed. They should not hang down onto the floor. Have a firm chair that has side arms. You can use this for support while you get dressed. Do not have throw rugs and other things on the floor that can make you trip. What can I do in the kitchen? Clean up any spills right away. Avoid walking on wet floors. Keep items that you use a lot in easy-to-reach places. If you need to reach something above you, use a strong step stool that has a grab bar. Keep electrical cords out of the way. Do not use floor polish or wax that makes floors slippery. If you must use wax, use non-skid floor wax. Do not have throw rugs and  other things on the floor that can make you trip. What can I do with my stairs? Do not leave any items on the stairs. Make sure that there are handrails on both sides of the stairs and use them. Fix handrails that are broken or loose. Make sure that handrails are as long as the stairways. Check any carpeting to make sure that it is firmly attached to the stairs. Fix any carpet that is loose or worn. Avoid having throw rugs at the top or bottom of the stairs. If you do have throw rugs, attach them to the floor with carpet tape. Make sure that you have a light switch at the top of the stairs and the bottom of the stairs. If you do not have them, ask someone to add them for you. What else can I do to help prevent falls? Wear shoes that: Do not have high heels. Have rubber bottoms. Are comfortable and fit you well. Are closed at the toe. Do not wear sandals. If you use a stepladder: Make sure that it is fully opened.  Do not climb a closed stepladder. Make sure that both sides of the stepladder are locked into place. Ask someone to hold it for you, if possible. Clearly mark and make sure that you can see: Any grab bars or handrails. First and last steps. Where the edge of each step is. Use tools that help you move around (mobility aids) if they are needed. These include: Canes. Walkers. Scooters. Crutches. Turn on the lights when you go into a dark area. Replace any light bulbs as soon as they burn out. Set up your furniture so you have a clear path. Avoid moving your furniture around. If any of your floors are uneven, fix them. If there are any pets around you, be aware of where they are. Review your medicines with your doctor. Some medicines can make you feel dizzy. This can increase your chance of falling. Ask your doctor what other things that you can do to help prevent falls. This information is not intended to replace advice given to you by your health care provider. Make sure you discuss any questions you have with your health care provider. Document Released: 12/29/2008 Document Revised: 08/10/2015 Document Reviewed: 04/08/2014 Elsevier Interactive Patient Education  2017 [employer].

## 2022-05-27 NOTE — Progress Notes (Signed)
I connected with  Lori Allison on 05/27/22 by a audio enabled telemedicine application and verified that I am speaking with the correct person using two identifiers.  Patient Location: Home  Provider Location: Office/Clinic  I discussed the limitations of evaluation and management by telemedicine. The patient expressed understanding and agreed to proceed.  Subjective:   Lori Allison is a 77 y.o. female who presents for Medicare Annual (Subsequent) preventive examination.   Patient Medicare AWV questionnaire was completed by the patient on 05/23/2022; I have confirmed that all information answered by patient is correct and no changes since this date.  .  Review of Systems     Cardiac Risk Factors include: advanced age (>65mn, >>23women);hypertension     Objective:    Today's Vitals   05/27/22 1126  BP: 124/72  Pulse: (!) 58  SpO2: 98%  Weight: 136 lb 6.4 oz (61.9 kg)  Height: '5\' 3"'$  (1.6 m)   Body mass index is 24.16 kg/m.     05/27/2022   11:30 AM 05/23/2021    2:38 PM 05/09/2020    9:11 AM 05/05/2019    1:14 PM  Advanced Directives  Does Patient Have a Medical Advance Directive? No No No No  Would patient like information on creating a medical advance directive?  No - Patient declined Yes (MAU/Ambulatory/Procedural Areas - Information given) No - Patient declined    Current Medications (verified) Outpatient Encounter Medications as of 05/27/2022  Medication Sig   alendronate (FOSAMAX) 70 MG tablet TAKE 1 TABLET(70 MG) BY MOUTH EVERY 7 DAYS WITH A FULL GLASS OF WATER AND ON AN EMPTY STOMACH   ALPRAZolam (XANAX) 0.5 MG tablet TAKE 1 TABLET(0.5 MG) BY MOUTH AT BEDTIME AS NEEDED   aspirin EC 81 MG tablet Take 1 tablet (81 mg total) by mouth daily.   azelastine (ASTELIN) 0.1 % nasal spray Place 1 spray into both nostrils 2 (two) times daily. As needed for post nasal drip   Calcium Carbonate-Vitamin D (OSCAL 500/200 D-3 PO) Take 1 tablet by mouth daily.   diclofenac sodium  (VOLTAREN) 1 % GEL Apply 2 g topically 3 (three) times daily as needed.   escitalopram (LEXAPRO) 20 MG tablet TAKE 1 TABLET(20 MG) BY MOUTH DAILY   ezetimibe (ZETIA) 10 MG tablet TAKE 1 TABLET(10 MG) BY MOUTH DAILY   metoprolol succinate (TOPROL-XL) 25 MG 24 hr tablet TAKE 1 TABLET(25 MG) BY MOUTH DAILY   Multiple Vitamin (MULTIVITAMIN) tablet Take 1 tablet by mouth daily.   omega-3 acid ethyl esters (LOVAZA) 1 g capsule Take by mouth daily.   simvastatin (ZOCOR) 40 MG tablet TAKE 1 TABLET(40 MG) BY MOUTH AT BEDTIME   tiZANidine (ZANAFLEX) 2 MG tablet Take 1-2 tablets (2-4 mg total) by mouth every 8 (eight) hours as needed for muscle spasms.   No facility-administered encounter medications on file as of 05/27/2022.    Allergies (verified) Patient has no known allergies.   History: Past Medical History:  Diagnosis Date   Chicken pox    Hay fever    Hyperlipidemia    Hypertension    UTI (urinary tract infection)    Past Surgical History:  Procedure Laterality Date   EYE SURGERY Bilateral 07/17/2018   Family History  Problem Relation Age of Onset   High blood pressure Mother    Breast cancer Mother    Heart disease Father    High blood pressure Sister    Early death Brother    Social History   Socioeconomic  History   Marital status: Divorced    Spouse name: Not on file   Number of children: Not on file   Years of education: Not on file   Highest education level: Not on file  Occupational History   Occupation: retired  Tobacco Use   Smoking status: Former    Types: Cigarettes    Quit date: 2008    Years since quitting: 16.2   Smokeless tobacco: Former  Scientific laboratory technician Use: Never used  Substance and Sexual Activity   Alcohol use: Not Currently   Drug use: Never   Sexual activity: Not on file  Other Topics Concern   Not on file  Social History Narrative   Not on file   Social Determinants of Health   Financial Resource Strain: [place]  (05/23/2022)    Overall Financial Resource Strain (CARDIA)    Difficulty of Paying Living Expenses: Not hard at all  Food Insecurity: No Food Insecurity (05/27/2022)   Hunger Vital Sign    Worried About Running Out of Food in the Last Year: Never true    [place] in the Last Year: Never true  Transportation Needs: No Transportation Needs (05/27/2022)   PRAPARE - Hydrologist (Medical): No    Lack of Transportation (Non-Medical): No  Physical Activity: Insufficiently Active (05/23/2022)   Exercise Vital Sign    Days of Exercise per Week: 2 days    Minutes of Exercise per Session: 30 min  Stress: Stress Concern Present (05/23/2022)   [place]    Feeling of Stress : To some extent  Social Connections: Unknown (05/23/2022)   Social Connection and Isolation Panel [NHANES]    Frequency of Communication with Friends and Family: More than three times a week    Frequency of Social Gatherings with Friends and Family: Once a week    Attends Religious Services: Not on Advertising copywriter or Organizations: No    Attends Archivist Meetings: Never    Marital Status: Divorced    Tobacco Counseling Counseling given: Not Answered   Clinical Intake:  Pre-visit preparation completed: Yes  Pain : No/denies pain     Nutritional Risks: None Diabetes: No  How often do you need to have someone help you when you read instructions, pamphlets, or other written materials from your doctor or pharmacy?: 1 - Never  Diabetic? no  Interpreter Needed?: No  Information entered by :: NAllen LPN   Activities of Daily Living    05/23/2022    2:52 PM  In your present state of health, do you have any difficulty performing the following activities:  Hearing? 0  Vision? 0  Difficulty concentrating or making decisions? 0  Walking or climbing stairs? 1  Dressing or bathing? 0  Doing errands, shopping? 0   Preparing Food and eating ? N  Using the Toilet? N  In the past six months, have you accidently leaked urine? N  Do you have problems with loss of bowel control? N  Managing your Medications? N  Managing your Finances? N  Housekeeping or managing your Housekeeping? N    Patient Care Team: Libby Maw, MD as PCP - General (Family Medicine)  Indicate any recent Medical Services you may have received from other than [facility] providers in the past year (date may be approximate).     Assessment:   This is a  routine wellness examination for Lori Allison.  Hearing/Vision screen Vision Screening - Comments:: Regular eye exams, Dr. Idolina Primer  Dietary issues and exercise activities discussed: Current Exercise Habits: Home exercise routine, Type of exercise: walking, Time (Minutes): 30, Frequency (Times/Week): 2, Weekly Exercise (Minutes/Week): 60   Goals Addressed             This Visit's Progress    Patient Stated       05/27/2022, wants to lose weight       Depression Screen    05/27/2022   11:30 AM 12/11/2021   10:58 AM 12/11/2021   10:16 AM 06/12/2021   11:05 AM 05/23/2021    2:39 PM 05/23/2021    2:37 PM 04/06/2021    3:02 PM  PHQ 2/9 Scores  PHQ - 2 Score 0 1 0 0 0 0 0  PHQ- 9 Score  3         Fall Risk    05/23/2022    2:52 PM 12/11/2021   10:16 AM 06/12/2021   11:04 AM 05/23/2021    2:39 PM 04/06/2021    3:02 PM  [place] in the past year? 0 0 0 0 0  Number falls in past yr: 0 0 0 0 0  Injury with Fall? 0  0 0   Risk for fall due to : Medication side effect   No Fall Risks   Follow up Falls prevention discussed;Education provided;Falls evaluation completed   Falls evaluation completed     FALL RISK PREVENTION PERTAINING TO THE HOME:  Any stairs in or around the home? Yes  If so, are there any without handrails? No  Home free of loose throw rugs in walkways, pet beds, electrical cords, etc? Yes  Adequate lighting in your home to reduce risk of falls? Yes    ASSISTIVE DEVICES UTILIZED TO PREVENT FALLS:  Life alert? No  Use of a cane, walker or w/c? No  Grab bars in the bathroom? Yes  Shower chair or bench in shower? No  Elevated toilet seat or a handicapped toilet? Yes   TIMED UP AND GO:  Was the test performed? No .      Cognitive Function:        05/27/2022   11:32 AM  6CIT Screen  What Year? 0 points  What month? 0 points  What time? 0 points  Count back from 20 0 points  Months in reverse 0 points  Repeat phrase 0 points  Total Score 0 points    Immunizations Immunization History  Administered Date(s) Administered   Fluad Quad(high Dose 65+) 12/28/2018   Influenza, High Dose Seasonal PF 12/28/2013, 12/10/2017, 12/11/2021   Influenza-Unspecified 11/17/2019, 12/13/2020   PFIZER(Purple Top)SARS-COV-2 Vaccination 04/24/2019, 05/19/2019, 12/30/2019   PNEUMOCOCCAL CONJUGATE-20 12/11/2021   Pneumococcal Polysaccharide-23 05/24/2018    TDAP status: Due, Education has been provided regarding the importance of this vaccine. Advised may receive this vaccine at local pharmacy or Health Dept. Aware to provide a copy of the vaccination record if obtained from local pharmacy or Health Dept. Verbalized acceptance and understanding.  Flu Vaccine status: Up to date  Pneumococcal vaccine status: Up to date  Covid-19 vaccine status: Completed vaccines  Qualifies for Shingles Vaccine? Yes   Zostavax completed No   Shingrix Completed?: No.    Education has been provided regarding the importance of this vaccine. Patient has been advised to call insurance company to determine out of pocket expense if they have not yet received this vaccine.  Advised may also receive vaccine at local pharmacy or Health Dept. Verbalized acceptance and understanding.  Screening Tests Health Maintenance  Topic Date Due   DTaP/Tdap/Td (1 - Tdap) Never done   Medicare Annual Wellness (AWV)  05/24/2022   Pneumonia Vaccine 44+ Years old  Completed    INFLUENZA VACCINE  Completed   DEXA SCAN  Completed   HPV VACCINES  Aged Out   COVID-19 Vaccine  Discontinued   Hepatitis C Screening  Discontinued   Zoster Vaccines- Shingrix  Discontinued    Health Maintenance  Health Maintenance Due  Topic Date Due   DTaP/Tdap/Td (1 - Tdap) Never done   Medicare Annual Wellness (AWV)  05/24/2022    Colorectal cancer screening: No longer required.   Mammogram status: patient to schedule  Bone Density status: Completed 10/28/2019.   Lung Cancer Screening: (Low Dose CT Chest recommended if Age 29-80 years, 30 pack-year currently smoking OR have quit w/in 15years.) does not qualify.   Lung Cancer Screening Referral: no  Additional Screening:  Hepatitis C Screening: does not qualify;   Vision Screening: Recommended annual ophthalmology exams for early detection of glaucoma and other disorders of the eye. Is the patient up to date with their annual eye exam?  Yes  Who is the provider or what is the name of the office in which the patient attends annual eye exams? Dr. Idolina Primer If pt is not established with a provider, would they like to be referred to a provider to establish care? No .   Dental Screening: Recommended annual dental exams for proper oral hygiene  Community Resource Referral / Chronic Care Management: CRR required this visit?  No   CCM required this visit?  No      Plan:     I have personally reviewed and noted the following in the patient's chart:   Medical and social history Use of alcohol, tobacco or illicit drugs  Current medications and supplements including opioid prescriptions. Patient is not currently taking opioid prescriptions. Functional ability and status Nutritional status Physical activity Advanced directives List of other physicians Hospitalizations, surgeries, and ER visits in previous 12 months Vitals Screenings to include cognitive, depression, and falls Referrals and appointments  In addition, I  have reviewed and discussed with patient certain preventive protocols, quality metrics, and best practice recommendations. A written personalized care plan for preventive services as well as general preventive health recommendations were provided to patient.     Kellie Simmering, LPN   [id number]   Nurse Notes: none  Due to this being a virtual visit, the after visit summary with patients personalized plan was offered to patient via mail or my-chart.  Patient would like to access on my-chart

## 2022-06-11 ENCOUNTER — Encounter: Payer: Self-pay | Admitting: Family Medicine

## 2022-06-11 ENCOUNTER — Ambulatory Visit (INDEPENDENT_AMBULATORY_CARE_PROVIDER_SITE_OTHER): Payer: Medicare Other | Admitting: Family Medicine

## 2022-06-11 VITALS — BP 166/84 | HR 66 | Temp 98.4°F | Ht 63.0 in | Wt 135.8 lb

## 2022-06-11 DIAGNOSIS — H6993 Unspecified Eustachian tube disorder, bilateral: Secondary | ICD-10-CM

## 2022-06-11 DIAGNOSIS — R0982 Postnasal drip: Secondary | ICD-10-CM | POA: Diagnosis not present

## 2022-06-11 DIAGNOSIS — H6121 Impacted cerumen, right ear: Secondary | ICD-10-CM

## 2022-06-11 DIAGNOSIS — M810 Age-related osteoporosis without current pathological fracture: Secondary | ICD-10-CM

## 2022-06-11 DIAGNOSIS — I10 Essential (primary) hypertension: Secondary | ICD-10-CM

## 2022-06-11 DIAGNOSIS — F419 Anxiety disorder, unspecified: Secondary | ICD-10-CM | POA: Diagnosis not present

## 2022-06-11 MED ORDER — ALPRAZOLAM 0.5 MG PO TABS: ORAL_TABLET | ORAL | 0 refills | Status: DC

## 2022-06-11 NOTE — Progress Notes (Signed)
Established Patient Office Visit   Subjective:  Patient ID: Lori Allison, female    DOB: 08-01-1945  Age: 77 y.o. MRN: [id number]  Chief Complaint  Patient presents with   Medical Management of Chronic Issues    6 month follow up, pt would like ears checked lots of popping clogged up patient has some sinus drainage. Fasting.     HPI Encounter Diagnoses  Name Primary?   Age-related osteoporosis without current pathological fracture Yes   Anxiety    PND (post-nasal drip)    Dysfunction of both eustachian tubes    Excessive cerumen in right ear canal    Essential hypertension    For follow-up of above.  BP at home was 128/70.  Continues with Fosamax, vitamin D and calcium.  Xanax as needed for anxiety.  Last refill for 30 pills was on 12/20.  Ongoing postnasal drip with bilateral ear congestion.  She is using Astelin daily and Flonase on occasion..  She is using no decongestants.  She claims compliance with her blood pressure medicine.   Review of Systems  Constitutional: Negative.   HENT:  Positive for congestion.   Eyes:  Negative for blurred vision, discharge and redness.  Respiratory: Negative.    Cardiovascular: Negative.   Gastrointestinal:  Negative for abdominal pain.  Genitourinary: Negative.   Musculoskeletal: Negative.  Negative for myalgias.  Skin:  Negative for rash.  Neurological:  Negative for tingling, loss of consciousness and weakness.  Endo/Heme/Allergies:  Negative for polydipsia.       06/11/2022   10:03 AM 05/27/2022   11:30 AM 12/11/2021   10:58 AM  Depression screen PHQ 2/9  Decreased Interest 0 0 1  Down, Depressed, Hopeless 0 0 0  PHQ - 2 Score 0 0 1  Altered sleeping   1  Tired, decreased energy   1  Change in appetite   0  Feeling bad or failure about yourself    0  Trouble concentrating   0  Moving slowly or fidgety/restless   0  Suicidal thoughts   0  PHQ-9 Score   3  Difficult doing work/chores   Not difficult at all     Current  Outpatient Medications:    alendronate (FOSAMAX) 70 MG tablet, TAKE 1 TABLET(70 MG) BY MOUTH EVERY 7 DAYS WITH A FULL GLASS OF WATER AND ON AN EMPTY STOMACH, Disp: 12 tablet, Rfl: 4   aspirin EC 81 MG tablet, Take 1 tablet (81 mg total) by mouth daily., Disp: 365 tablet, Rfl: 1   azelastine (ASTELIN) 0.1 % nasal spray, Place 1 spray into both nostrils 2 (two) times daily. As needed for post nasal drip, Disp: 30 mL, Rfl: 12   Calcium Carbonate-Vitamin D (OSCAL 500/200 D-3 PO), Take 1 tablet by mouth daily., Disp: , Rfl:    diclofenac sodium (VOLTAREN) 1 % GEL, Apply 2 g topically 3 (three) times daily as needed., Disp: 100 g, Rfl: 1   escitalopram (LEXAPRO) 20 MG tablet, TAKE 1 TABLET(20 MG) BY MOUTH DAILY, Disp: 90 tablet, Rfl: 1   ezetimibe (ZETIA) 10 MG tablet, TAKE 1 TABLET(10 MG) BY MOUTH DAILY, Disp: 90 tablet, Rfl: 3   metoprolol succinate (TOPROL-XL) 25 MG 24 hr tablet, TAKE 1 TABLET(25 MG) BY MOUTH DAILY, Disp: 90 tablet, Rfl: 3   Multiple Vitamin (MULTIVITAMIN) tablet, Take 1 tablet by mouth daily., Disp: , Rfl:    omega-3 acid ethyl esters (LOVAZA) 1 g capsule, Take by mouth daily., Disp: , Rfl:  simvastatin (ZOCOR) 40 MG tablet, TAKE 1 TABLET(40 MG) BY MOUTH AT BEDTIME, Disp: 90 tablet, Rfl: 3   tiZANidine (ZANAFLEX) 2 MG tablet, Take 1-2 tablets (2-4 mg total) by mouth every 8 (eight) hours as needed for muscle spasms., Disp: 60 tablet, Rfl: 1   ALPRAZolam (XANAX) 0.5 MG tablet, TAKE 1 TABLET(0.5 MG) BY MOUTH AT BEDTIME AS NEEDED, Disp: 30 tablet, Rfl: 0   Objective:     BP (!) 166/84 (BP Location: Left Arm, Patient Position: Sitting, Cuff Size: Normal)   Pulse 66   Temp 98.4 F (36.9 C) (Temporal)   Ht 5\' 3"  (1.6 m)   Wt 135 lb 12.8 oz (61.6 kg)   SpO2 99%   BMI 24.06 kg/m    Physical Exam Constitutional:      General: She is not in acute distress.    Appearance: Normal appearance. She is not ill-appearing, toxic-appearing or diaphoretic.  HENT:     Head:  Normocephalic and atraumatic.     Right Ear: External ear normal. No middle ear effusion. Tympanic membrane is retracted. Tympanic membrane is not erythematous.     Left Ear: Ear canal and external ear normal.  No middle ear effusion. Tympanic membrane is retracted. Tympanic membrane is not erythematous.     Ears:     Comments: Ceruminosis left ear canal.  Both eardrums are retracted.    Mouth/Throat:     Mouth: Mucous membranes are moist.     Pharynx: Oropharynx is clear. No oropharyngeal exudate or posterior oropharyngeal erythema.  Eyes:     General: No scleral icterus.       Right eye: No discharge.        Left eye: No discharge.     Extraocular Movements: Extraocular movements intact.     Conjunctiva/sclera: Conjunctivae normal.     Pupils: Pupils are equal, round, and reactive to light.  Cardiovascular:     Rate and Rhythm: Normal rate and regular rhythm.  Pulmonary:     Effort: Pulmonary effort is normal. No respiratory distress.     Breath sounds: Normal breath sounds.  Musculoskeletal:     Cervical back: No rigidity or tenderness.  Skin:    General: Skin is warm and dry.  Neurological:     Mental Status: She is alert and oriented to person, place, and time.  Psychiatric:        Mood and Affect: Mood normal.        Behavior: Behavior normal.   Patient was okay she is post to come back in a month and bring her cuff whether he do not and I will day I believe you   No results found for any visits on 06/11/22.    The 10-year ASCVD risk score (Arnett DK, et al., 2019) is: 36.1%    Assessment & Plan:   Age-related osteoporosis without current pathological fracture  Anxiety -     ALPRAZolam; TAKE 1 TABLET(0.5 MG) BY MOUTH AT BEDTIME AS NEEDED  Dispense: 30 tablet; Refill: 0  PND (post-nasal drip)  Dysfunction of both eustachian tubes  Excessive cerumen in right ear canal  Essential hypertension    Return in about 1 month (around 07/12/2022), or USE FLONASE  DAILY. BRING HOME BP CUFF WITH YOU NEXT VISIT..  Will irrigate right ear at home.  Continue current medications.  Stressed the importance of Flonase daily  Libby Maw, MD

## 2022-06-13 ENCOUNTER — Other Ambulatory Visit: Payer: Self-pay | Admitting: Family Medicine

## 2022-06-13 DIAGNOSIS — Z1231 Encounter for screening mammogram for malignant neoplasm of breast: Secondary | ICD-10-CM

## 2022-06-20 ENCOUNTER — Other Ambulatory Visit: Payer: Self-pay | Admitting: Family Medicine

## 2022-06-20 DIAGNOSIS — F419 Anxiety disorder, unspecified: Secondary | ICD-10-CM

## 2022-06-20 NOTE — Telephone Encounter (Signed)
Refill request for pending Rx last OV 06/11/22 last refill 12/26/22. Please advise

## 2022-07-12 ENCOUNTER — Encounter: Payer: Self-pay | Admitting: Family Medicine

## 2022-07-12 ENCOUNTER — Ambulatory Visit (INDEPENDENT_AMBULATORY_CARE_PROVIDER_SITE_OTHER): Payer: Medicare Other | Admitting: Family Medicine

## 2022-07-12 VITALS — BP 140/80 | HR 61 | Temp 97.7°F | Resp 16 | Ht 63.0 in | Wt 139.1 lb

## 2022-07-12 DIAGNOSIS — I10 Essential (primary) hypertension: Secondary | ICD-10-CM | POA: Diagnosis not present

## 2022-07-12 DIAGNOSIS — E7800 Pure hypercholesterolemia, unspecified: Secondary | ICD-10-CM

## 2022-07-12 DIAGNOSIS — E559 Vitamin D deficiency, unspecified: Secondary | ICD-10-CM | POA: Diagnosis not present

## 2022-07-12 LAB — BASIC METABOLIC PANEL
BUN: 9 mg/dL (ref 6–23)
CO2: 28 mEq/L (ref 19–32)
Calcium: 8.6 mg/dL (ref 8.4–10.5)
Chloride: 103 mEq/L (ref 96–112)
Creatinine, Ser: 0.71 mg/dL (ref 0.40–1.20)
GFR: 82.25 mL/min (ref >60.00)
Glucose, Bld: 95 mg/dL (ref 70–99)
Potassium: 4.5 mEq/L (ref 3.5–5.1)
Sodium: 139 mEq/L (ref 135–145)

## 2022-07-12 LAB — CBC
HCT: 43.7 % (ref 36.0–46.0)
Hemoglobin: 14.4 g/dL (ref 12.0–15.0)
MCHC: 32.9 g/dL (ref 30.0–36.0)
MCV: 92.8 fl (ref 78.0–100.0)
Platelets: 286 10*3/uL (ref 150.0–400.0)
RBC: 4.71 Mil/uL (ref 3.87–5.11)
RDW: 13.3 % (ref 11.5–15.5)
WBC: 6.3 10*3/uL (ref 4.0–10.5)

## 2022-07-12 LAB — LIPID PANEL
Cholesterol: 153 mg/dL (ref 0–200)
HDL: 67.9 mg/dL (ref >39.00)
LDL Cholesterol: 61 mg/dL (ref 0–99)
NonHDL: 85.04
Total CHOL/HDL Ratio: 2
Triglycerides: 118 mg/dL (ref 0.0–149.0)
VLDL: 23.6 mg/dL (ref 0.0–40.0)

## 2022-07-12 LAB — VITAMIN D 25 HYDROXY (VIT D DEFICIENCY, FRACTURES): VITD: 44.3 ng/mL (ref 30.00–100.00)

## 2022-07-12 MED ORDER — CARVEDILOL 6.25 MG PO TABS: 6.2500 mg | ORAL_TABLET | Freq: Two times a day (BID) | ORAL | 3 refills | Status: DC

## 2022-07-12 NOTE — Progress Notes (Signed)
Established Patient Office Visit   Subjective:  Patient ID: Lori Allison, female    DOB: 04/07/1945  Age: 77 y.o. MRN: [id number]  Chief Complaint  Patient presents with   Hypertension    Follow up on HTN Pt states her BP has been ok at home  Doing well  She did bring her home machine to visit today  Pt took her BP in office after I took hers with our machine pt BP with her machine in our office is 161/78    Hypertension Pertinent negatives include no blurred vision.   Encounter Diagnoses  Name Primary?   Essential hypertension Yes   Elevated LDL cholesterol level    Vitamin D deficiency    For follow-up of above.  Blood pressure at home is running in the 130s to 160s over 70s to 80s.  Continues with simvastatin and Zetia for cholesterol with history of carotid artery stenosis.   Review of Systems  Constitutional: Negative.   HENT: Negative.    Eyes:  Negative for blurred vision, discharge and redness.  Respiratory: Negative.    Cardiovascular: Negative.   Gastrointestinal:  Negative for abdominal pain.  Genitourinary: Negative.   Musculoskeletal: Negative.  Negative for myalgias.  Skin:  Negative for rash.  Neurological:  Negative for tingling, loss of consciousness and weakness.  Endo/Heme/Allergies:  Negative for polydipsia.     Current Outpatient Medications:    alendronate (FOSAMAX) 70 MG tablet, TAKE 1 TABLET(70 MG) BY MOUTH EVERY 7 DAYS WITH A FULL GLASS OF WATER AND ON AN EMPTY STOMACH, Disp: 12 tablet, Rfl: 4   ALPRAZolam (XANAX) 0.5 MG tablet, TAKE 1 TABLET(0.5 MG) BY MOUTH AT BEDTIME AS NEEDED, Disp: 30 tablet, Rfl: 0   aspirin EC 81 MG tablet, Take 1 tablet (81 mg total) by mouth daily., Disp: 365 tablet, Rfl: 1   azelastine (ASTELIN) 0.1 % nasal spray, Place 1 spray into both nostrils 2 (two) times daily. As needed for post nasal drip, Disp: 30 mL, Rfl: 12   Calcium Carbonate-Vitamin D (OSCAL 500/200 D-3 PO), Take 1 tablet by mouth daily., Disp: , Rfl:     carvedilol (COREG) 6.25 MG tablet, Take 1 tablet (6.25 mg total) by mouth 2 (two) times daily with a meal., Disp: 60 tablet, Rfl: 3   diclofenac sodium (VOLTAREN) 1 % GEL, Apply 2 g topically 3 (three) times daily as needed., Disp: 100 g, Rfl: 1   escitalopram (LEXAPRO) 20 MG tablet, TAKE 1 TABLET(20 MG) BY MOUTH DAILY, Disp: 90 tablet, Rfl: 1   ezetimibe (ZETIA) 10 MG tablet, TAKE 1 TABLET(10 MG) BY MOUTH DAILY, Disp: 90 tablet, Rfl: 3   Multiple Vitamin (MULTIVITAMIN) tablet, Take 1 tablet by mouth daily., Disp: , Rfl:    omega-3 acid ethyl esters (LOVAZA) 1 g capsule, Take by mouth daily., Disp: , Rfl:    simvastatin (ZOCOR) 40 MG tablet, TAKE 1 TABLET(40 MG) BY MOUTH AT BEDTIME, Disp: 90 tablet, Rfl: 3   tiZANidine (ZANAFLEX) 2 MG tablet, Take 1-2 tablets (2-4 mg total) by mouth every 8 (eight) hours as needed for muscle spasms., Disp: 60 tablet, Rfl: 1   Objective:     BP (!) 140/80   Pulse 61   Temp 97.7 F (36.5 C) (Temporal)   Resp 16   Ht 5\' 3"  (1.6 m)   Wt 139 lb 2 oz (63.1 kg)   SpO2 96%   BMI 24.64 kg/m    Physical Exam Constitutional:      General:  She is not in acute distress.    Appearance: Normal appearance. She is not ill-appearing, toxic-appearing or diaphoretic.  HENT:     Head: Normocephalic and atraumatic.     Right Ear: External ear normal.     Left Ear: External ear normal.     Mouth/Throat:     Mouth: Mucous membranes are moist.     Pharynx: Oropharynx is clear. No oropharyngeal exudate or posterior oropharyngeal erythema.  Eyes:     General: No scleral icterus.       Right eye: No discharge.        Left eye: No discharge.     Extraocular Movements: Extraocular movements intact.     Conjunctiva/sclera: Conjunctivae normal.     Pupils: Pupils are equal, round, and reactive to light.  Cardiovascular:     Rate and Rhythm: Normal rate and regular rhythm.  Pulmonary:     Effort: Pulmonary effort is normal. No respiratory distress.     Breath sounds:  Normal breath sounds.  Musculoskeletal:     Cervical back: No rigidity or tenderness.  Skin:    General: Skin is warm and dry.  Neurological:     Mental Status: She is alert and oriented to person, place, and time.  Psychiatric:        Mood and Affect: Mood normal.        Behavior: Behavior normal.      No results found for any visits on 07/12/22.    The 10-year ASCVD risk score (Arnett DK, et al., 2019) is: 30.4%    Assessment & Plan:   Essential hypertension -     Basic metabolic panel -     CBC -     Carvedilol; Take 1 tablet (6.25 mg total) by mouth 2 (two) times daily with a meal.  Dispense: 60 tablet; Refill: 3  Elevated LDL cholesterol level -     Lipid panel  Vitamin D deficiency -     VITAMIN D 25 Hydroxy (Vit-D Deficiency, Fractures)    Return in about 8 weeks (around 09/06/2022).  Discontinue metoprolol and change to carvedilol 6.25 twice daily.  Continue to check and record blood pressures.  Follow-up in 8 weeks.  Mliss Sax, MD

## 2022-07-17 ENCOUNTER — Other Ambulatory Visit: Payer: Self-pay

## 2022-07-17 ENCOUNTER — Ambulatory Visit (INDEPENDENT_AMBULATORY_CARE_PROVIDER_SITE_OTHER): Payer: Medicare Other | Admitting: Family Medicine

## 2022-07-17 ENCOUNTER — Encounter: Payer: Self-pay | Admitting: Family Medicine

## 2022-07-17 VITALS — BP 140/76 | HR 68 | Ht 63.0 in | Wt 141.0 lb

## 2022-07-17 DIAGNOSIS — G8929 Other chronic pain: Secondary | ICD-10-CM

## 2022-07-17 DIAGNOSIS — M25561 Pain in right knee: Secondary | ICD-10-CM

## 2022-07-17 DIAGNOSIS — M25562 Pain in left knee: Secondary | ICD-10-CM

## 2022-07-17 DIAGNOSIS — M170 Bilateral primary osteoarthritis of knee: Secondary | ICD-10-CM

## 2022-07-17 NOTE — Progress Notes (Signed)
I, Stevenson Clinch, CMA acting as a scribe for [name], MD.  Lori Allison is a 77 y.o. female who presents to [facility] at [facility] today for cont'd bilateral knee pain. 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 Pt was last seen by Dr. Denyse Amass on 03/01/22 and was given bilat knee steroid injections and was advised we could re-auth Zilretta in the new year.   Today, pt reports some improvement of sx with last inj, sx started flaring up again beginning of April. Some swelling bilaterally. Left knee will catch occasionally. Denies radicular sx. Tylenol prn.   Dx imaging: 12/25/20 R & L knee XR 12/31/17 L knee MRI  Pertinent review of systems: No fevers or chills   Relevant historical information: Peripheral vascular disease.  Hypertension.  Osteoporosis.   Exam:  BP (!) 140/76   Pulse 68   Ht 5\' 3"  (1.6 m)   Wt 141 lb (64 kg)   SpO2 97%   BMI 24.98 kg/m  General: Well Developed, well nourished, and in no acute distress.   MSK: Knees bilaterally moderate effusion no erythema normal motion.    Lab and Radiology Results  Procedure: Real-time Ultrasound Guided Injection of right knee joint superior lateral patellar space Device: Philips Affiniti 50G Images permanently stored and available for review in PACS Verbal informed consent obtained.  Discussed risks and benefits of procedure. Warned about infection, bleeding, hyperglycemia damage to structures among others. Patient expresses understanding and agreement Time-out conducted.   Noted no overlying erythema, induration, or other signs of local infection.   Skin prepped in a sterile fashion.   Local anesthesia: Topical Ethyl chloride.   With sterile technique and under real time ultrasound guidance: 40 mg of Kenalog and 2 mL Marcaine injected into the joint. Fluid seen entering the joint capsule.   Completed without difficulty   Pain immediately resolved suggesting accurate placement of  the medication.   Advised to call if fevers/chills, erythema, induration, drainage, or persistent bleeding.   Images permanently stored and available for review in the ultrasound unit.  Impression: Technically successful ultrasound guided injection.    Procedure: Real-time Ultrasound Guided Injection of left knee joint superior lateral patellar space Device: Philips Affiniti 50G Images permanently stored and available for review in PACS Verbal informed consent obtained.  Discussed risks and benefits of procedure. Warned about infection, bleeding, hyperglycemia damage to structures among others. Patient expresses understanding and agreement Time-out conducted.   Noted no overlying erythema, induration, or other signs of local infection.   Skin prepped in a sterile fashion.   Local anesthesia: Topical Ethyl chloride.   With sterile technique and under real time ultrasound guidance: 40 mg of Kenalog and 2 mL Marcaine injected into knee joint. Fluid seen entering the joint capsule.   Completed without difficulty   Pain immediately resolved suggesting accurate placement of the medication.   Advised to call if fevers/chills, erythema, induration, drainage, or persistent bleeding.   Images permanently stored and available for review in the ultrasound unit.  Impression: Technically successful ultrasound guided injection.        Assessment and Plan: 77 y.o. female with bilateral knee pain due to exacerbation of DJD.  This is an acute exacerbation of a chronic problem.  Plan for steroid injection today.  Recheck back as needed.  Can repeat this injection every 3 months as needed. Arletta Bale is authorized but not necessary as the shots are lasting long enough.   PDMP not reviewed  this encounter. Orders Placed This Encounter  Procedures   [place] LIMITED JOINT SPACE STRUCTURES LOW BILAT(NO LINKED CHARGES)    Order Specific Question:   Reason for Exam (SYMPTOM  OR DIAGNOSIS REQUIRED)    Answer:    bilateral knee pain    Order Specific Question:   Preferred imaging location?    Answer:   [facility]   No orders of the defined types were placed in this encounter.    Discussed warning signs or symptoms. Please see discharge instructions. Patient expresses understanding.   The above documentation has been reviewed and is accurate and complete [name], M.D.

## 2022-07-17 NOTE — Patient Instructions (Signed)
Thank you for coming in today.   Call or go to the ER if you develop a large red swollen joint with extreme pain or oozing puss.    We can do this every 3 months as needed.   Enjoy the beach.  Recheck as needed.

## 2022-07-22 ENCOUNTER — Encounter: Payer: Self-pay | Admitting: Family Medicine

## 2022-07-22 DIAGNOSIS — I10 Essential (primary) hypertension: Secondary | ICD-10-CM

## 2022-07-22 MED ORDER — METOPROLOL TARTRATE 50 MG PO TABS: 50.0000 mg | ORAL_TABLET | Freq: Two times a day (BID) | ORAL | 2 refills | Status: DC

## 2022-07-22 NOTE — Telephone Encounter (Signed)
Patient aware of message below and will start on increased dose

## 2022-08-13 ENCOUNTER — Ambulatory Visit
Admission: RE | Admit: 2022-08-13 | Discharge: 2022-08-13 | Disposition: A | Discharge status: Home / Self Care | Payer: Medicare Other | Source: Ambulatory Visit | Attending: Family Medicine | Admitting: Family Medicine

## 2022-08-13 DIAGNOSIS — Z1231 Encounter for screening mammogram for malignant neoplasm of breast: Secondary | ICD-10-CM

## 2022-09-05 ENCOUNTER — Other Ambulatory Visit: Payer: Self-pay | Admitting: Family Medicine

## 2022-09-05 DIAGNOSIS — F419 Anxiety disorder, unspecified: Secondary | ICD-10-CM

## 2022-09-06 ENCOUNTER — Ambulatory Visit (INDEPENDENT_AMBULATORY_CARE_PROVIDER_SITE_OTHER): Payer: Medicare Other | Admitting: Family Medicine

## 2022-09-06 ENCOUNTER — Encounter: Payer: Self-pay | Admitting: Family Medicine

## 2022-09-06 ENCOUNTER — Other Ambulatory Visit: Payer: Self-pay | Admitting: Family Medicine

## 2022-09-06 VITALS — BP 142/70 | HR 51 | Temp 97.2°F | Ht 63.0 in | Wt 141.6 lb

## 2022-09-06 DIAGNOSIS — F419 Anxiety disorder, unspecified: Secondary | ICD-10-CM

## 2022-09-06 DIAGNOSIS — I10 Essential (primary) hypertension: Secondary | ICD-10-CM

## 2022-09-06 MED ORDER — CHLORTHALIDONE 25 MG PO TABS
25.0000 mg | ORAL_TABLET | Freq: Every day | ORAL | 1 refills | Status: DC
Start: 2022-09-06 — End: 2022-12-09

## 2022-09-06 MED ORDER — METOPROLOL TARTRATE 50 MG PO TABS: 50.0000 mg | ORAL_TABLET | Freq: Two times a day (BID) | ORAL | 2 refills | Status: DC

## 2022-09-06 NOTE — Progress Notes (Signed)
Established Patient Office Visit   Subjective:  Patient ID: Lori Allison, female    DOB: 05-16-1945  Age: 77 y.o. MRN: [id number]  Chief Complaint  Patient presents with   Follow-up    Follow up Blood Pressure. Pt states here BP reading are better since changing Rx dose.     HPI Encounter Diagnoses  Name Primary?   Essential hypertension Yes   Follow-up of hypertension.  Patient complained of headache while taking carvedilol.  Was switched back to metoprolol with an increased dose of 50 mg twice daily.  Blood pressure at home has been better than today.  It is typically in the 140/80 range.  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  Patient is stressed.  Patient denies lightheadedness or headaches.   Review of Systems  Constitutional: Negative.   HENT: Negative.    Eyes:  Negative for blurred vision, discharge and redness.  Respiratory: Negative.    Cardiovascular: Negative.   Gastrointestinal:  Negative for abdominal pain.  Genitourinary: Negative.   Musculoskeletal: Negative.  Negative for myalgias.  Skin:  Negative for rash.  Neurological:  Negative for dizziness, tingling, loss of consciousness, weakness and headaches.  Endo/Heme/Allergies:  Negative for polydipsia.     Current Outpatient Medications:    alendronate (FOSAMAX) 70 MG tablet, TAKE 1 TABLET(70 MG) BY MOUTH EVERY 7 DAYS WITH A FULL GLASS OF WATER AND ON AN EMPTY STOMACH, Disp: 12 tablet, Rfl: 4   ALPRAZolam (XANAX) 0.5 MG tablet, TAKE 1 TABLET(0.5 MG) BY MOUTH AT BEDTIME AS NEEDED, Disp: 30 tablet, Rfl: 0   aspirin EC 81 MG tablet, Take 1 tablet (81 mg total) by mouth daily., Disp: 365 tablet, Rfl: 1   azelastine (ASTELIN) 0.1 % nasal spray, Place 1 spray into both nostrils 2 (two) times daily. As needed for post nasal drip, Disp: 30 mL, Rfl: 12   Calcium Carbonate-Vitamin D (OSCAL 500/200 D-3 PO), Take 1 tablet by mouth daily., Disp: , Rfl:    chlorthalidone (HYGROTON) 25  MG tablet, Take 1 tablet (25 mg total) by mouth daily., Disp: 90 tablet, Rfl: 1   diclofenac sodium (VOLTAREN) 1 % GEL, Apply 2 g topically 3 (three) times daily as needed., Disp: 100 g, Rfl: 1   escitalopram (LEXAPRO) 20 MG tablet, TAKE 1 TABLET(20 MG) BY MOUTH DAILY, Disp: 90 tablet, Rfl: 1   ezetimibe (ZETIA) 10 MG tablet, TAKE 1 TABLET(10 MG) BY MOUTH DAILY, Disp: 90 tablet, Rfl: 3   Multiple Vitamin (MULTIVITAMIN) tablet, Take 1 tablet by mouth daily., Disp: , Rfl:    omega-3 acid ethyl esters (LOVAZA) 1 g capsule, Take by mouth daily., Disp: , Rfl:    simvastatin (ZOCOR) 40 MG tablet, TAKE 1 TABLET(40 MG) BY MOUTH AT BEDTIME, Disp: 90 tablet, Rfl: 3   tiZANidine (ZANAFLEX) 2 MG tablet, Take 1-2 tablets (2-4 mg total) by mouth every 8 (eight) hours as needed for muscle spasms., Disp: 60 tablet, Rfl: 1   metoprolol tartrate (LOPRESSOR) 50 MG tablet, Take 1 tablet (50 mg total) by mouth 2 (two) times daily., Disp: 60 tablet, Rfl: 2   Objective:     BP (!) 168/88   Pulse (!) 51   Temp (!) 97.2 F (36.2 C)   Ht 5\' 3"  (1.6 m)   Wt 141 lb 9.6 oz (64.2 kg)   BMI 25.08 kg/m    Physical Exam Constitutional:      General: She is not in acute distress.  Appearance: Normal appearance. She is not ill-appearing, toxic-appearing or diaphoretic.  HENT:     Head: Normocephalic and atraumatic.     Right Ear: External ear normal.     Left Ear: External ear normal.  Eyes:     General: No scleral icterus.       Right eye: No discharge.        Left eye: No discharge.     Extraocular Movements: Extraocular movements intact.     Conjunctiva/sclera: Conjunctivae normal.  Cardiovascular:     Rate and Rhythm: Normal rate and regular rhythm.  Pulmonary:     Effort: Pulmonary effort is normal. No respiratory distress.     Breath sounds: Normal breath sounds.  Musculoskeletal:     Cervical back: No rigidity or tenderness.  Skin:    General: Skin is warm and dry.  Neurological:     Mental  Status: She is alert and oriented to person, place, and time.  Psychiatric:        Mood and Affect: Mood normal.        Behavior: Behavior normal.      No results found for any visits on 09/06/22.    The 10-year ASCVD risk score (Arnett DK, et al., 2019) is: 40.7%    Assessment & Plan:   Essential hypertension -     Chlorthalidone; Take 1 tablet (25 mg total) by mouth daily.  Dispense: 90 tablet; Refill: 1 -     Metoprolol Tartrate; Take 1 tablet (50 mg total) by mouth 2 (two) times daily.  Dispense: 60 tablet; Refill: 2    Return in about 3 months (around 12/07/2022).  Have added chlorthalidone.  Patient will check and record her blood pressures.  Continue metoprolol 50 mg twice daily.  Mliss Sax, MD

## 2022-09-08 ENCOUNTER — Other Ambulatory Visit: Payer: Self-pay | Admitting: Family Medicine

## 2022-09-08 DIAGNOSIS — I739 Peripheral vascular disease, unspecified: Secondary | ICD-10-CM

## 2022-09-08 DIAGNOSIS — E7800 Pure hypercholesterolemia, unspecified: Secondary | ICD-10-CM

## 2022-10-04 ENCOUNTER — Other Ambulatory Visit: Payer: Self-pay | Admitting: Family

## 2022-10-04 DIAGNOSIS — F419 Anxiety disorder, unspecified: Secondary | ICD-10-CM

## 2022-10-08 ENCOUNTER — Other Ambulatory Visit: Payer: Self-pay

## 2022-10-09 ENCOUNTER — Other Ambulatory Visit: Payer: Self-pay

## 2022-10-09 DIAGNOSIS — F419 Anxiety disorder, unspecified: Secondary | ICD-10-CM

## 2022-10-09 MED ORDER — ESCITALOPRAM OXALATE 20 MG PO TABS
20.0000 mg | ORAL_TABLET | Freq: Every day | ORAL | 1 refills | Status: DC
Start: 2022-10-09 — End: 2023-06-26

## 2022-10-13 IMAGING — DX DG KNEE AP/LAT W/ SUNRISE*L*
3 series · 3 of 3 positions shown · non-contrast
Comparison: None.

CLINICAL DATA: Chronic bilateral knee pain.

EXAM:
LEFT KNEE 3 VIEWS

[knee ap]
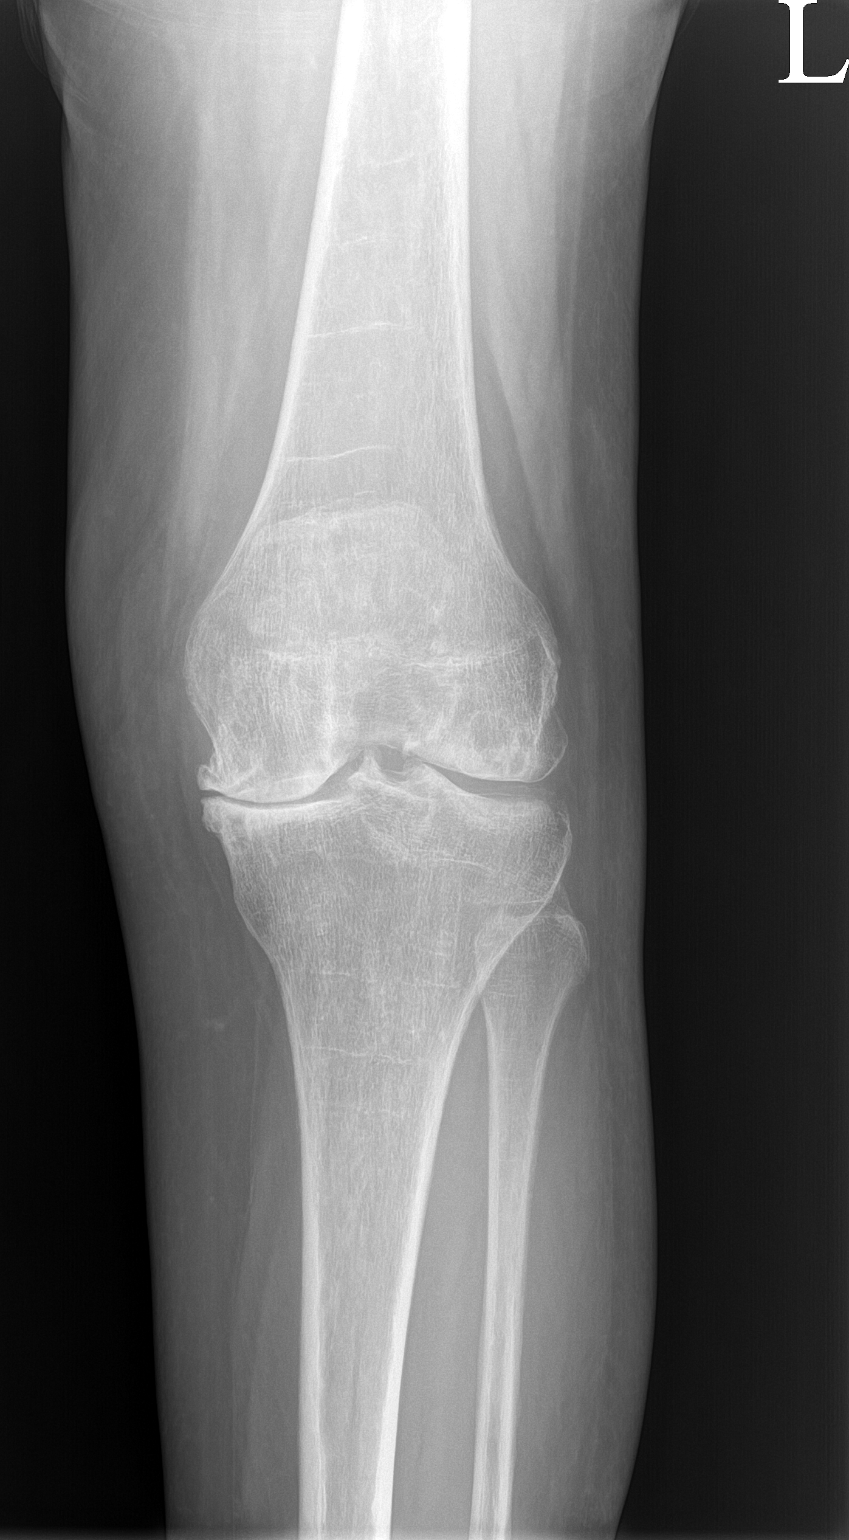

[knee lat]
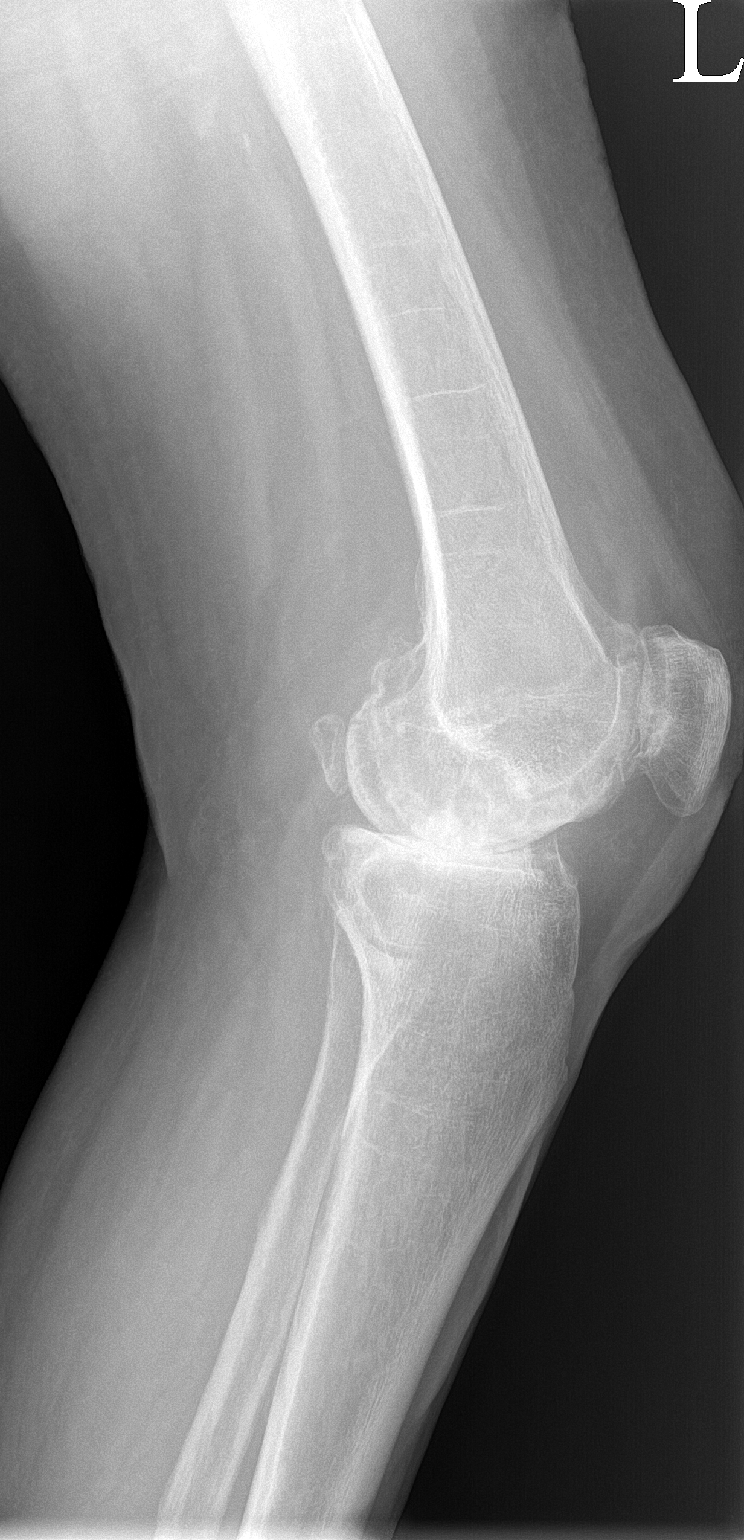

[patella]
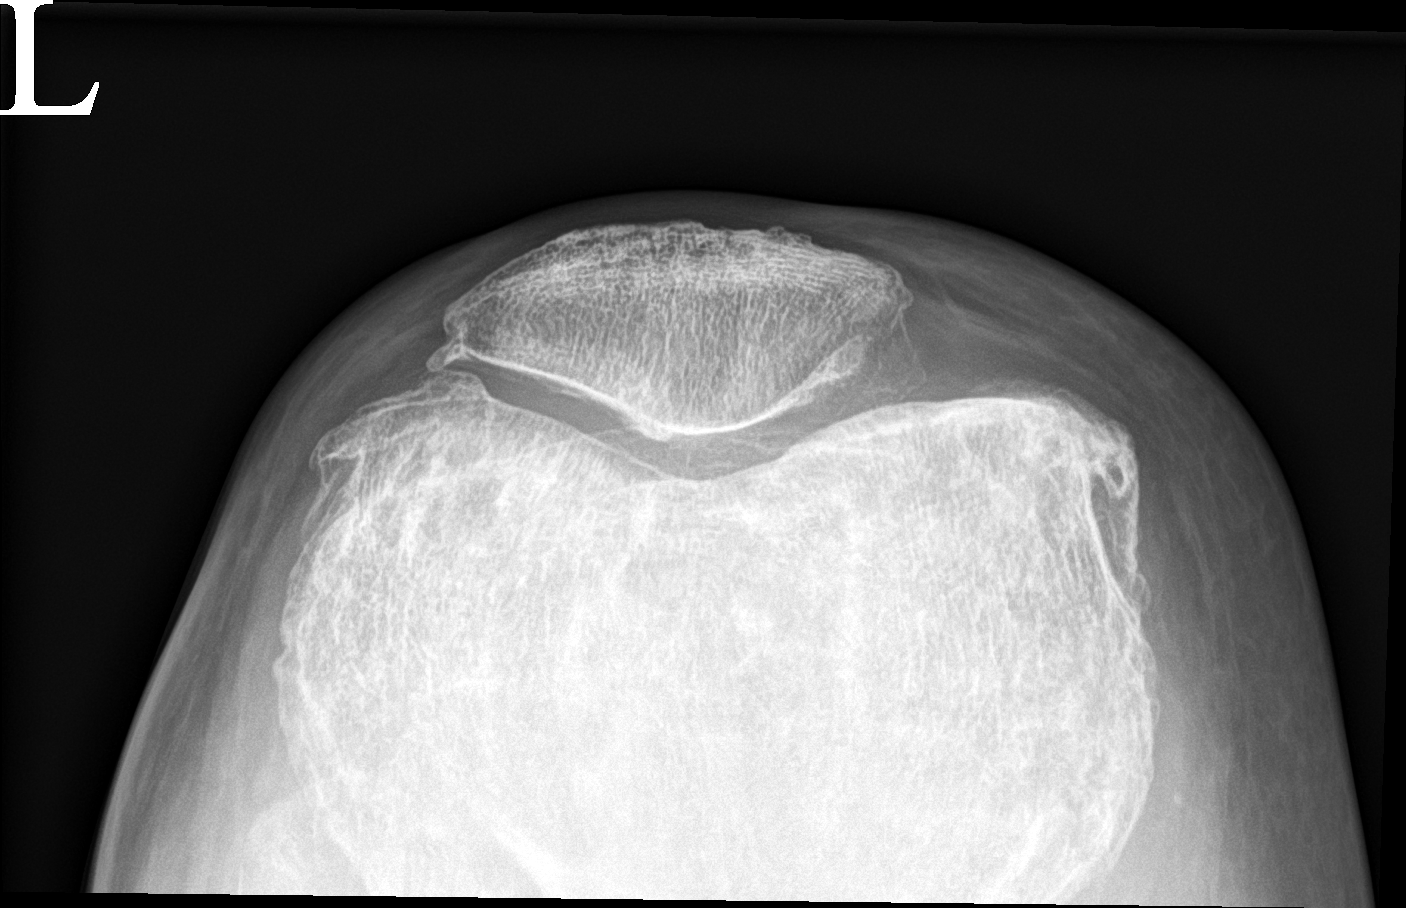

[3 of 3 positions shown; findings below may reference images not displayed]

FINDINGS: No evidence of an acute fracture or dislocation. Marginal
osteophytes are seen along the medial aspect of the left knee.
Marked severity medial tibiofemoral compartment space narrowing is
seen. Moderate severity lateral tibiofemoral and patellofemoral
compartment space narrowing is also seen. Periarticular sclerosis
and subchondral cysts are noted. There is a small joint effusion.
IMPRESSION: 1. Severe tricompartmental osteoarthritis of the left knee.
2. Small joint effusion.

## 2022-10-13 IMAGING — DX DG KNEE AP/LAT W/ SUNRISE*R*
3 series · 3 of 3 positions shown · non-contrast
Comparison: None.

CLINICAL DATA: Bilateral knee pain.

EXAM:
RIGHT KNEE 3 VIEWS

[knee ap]
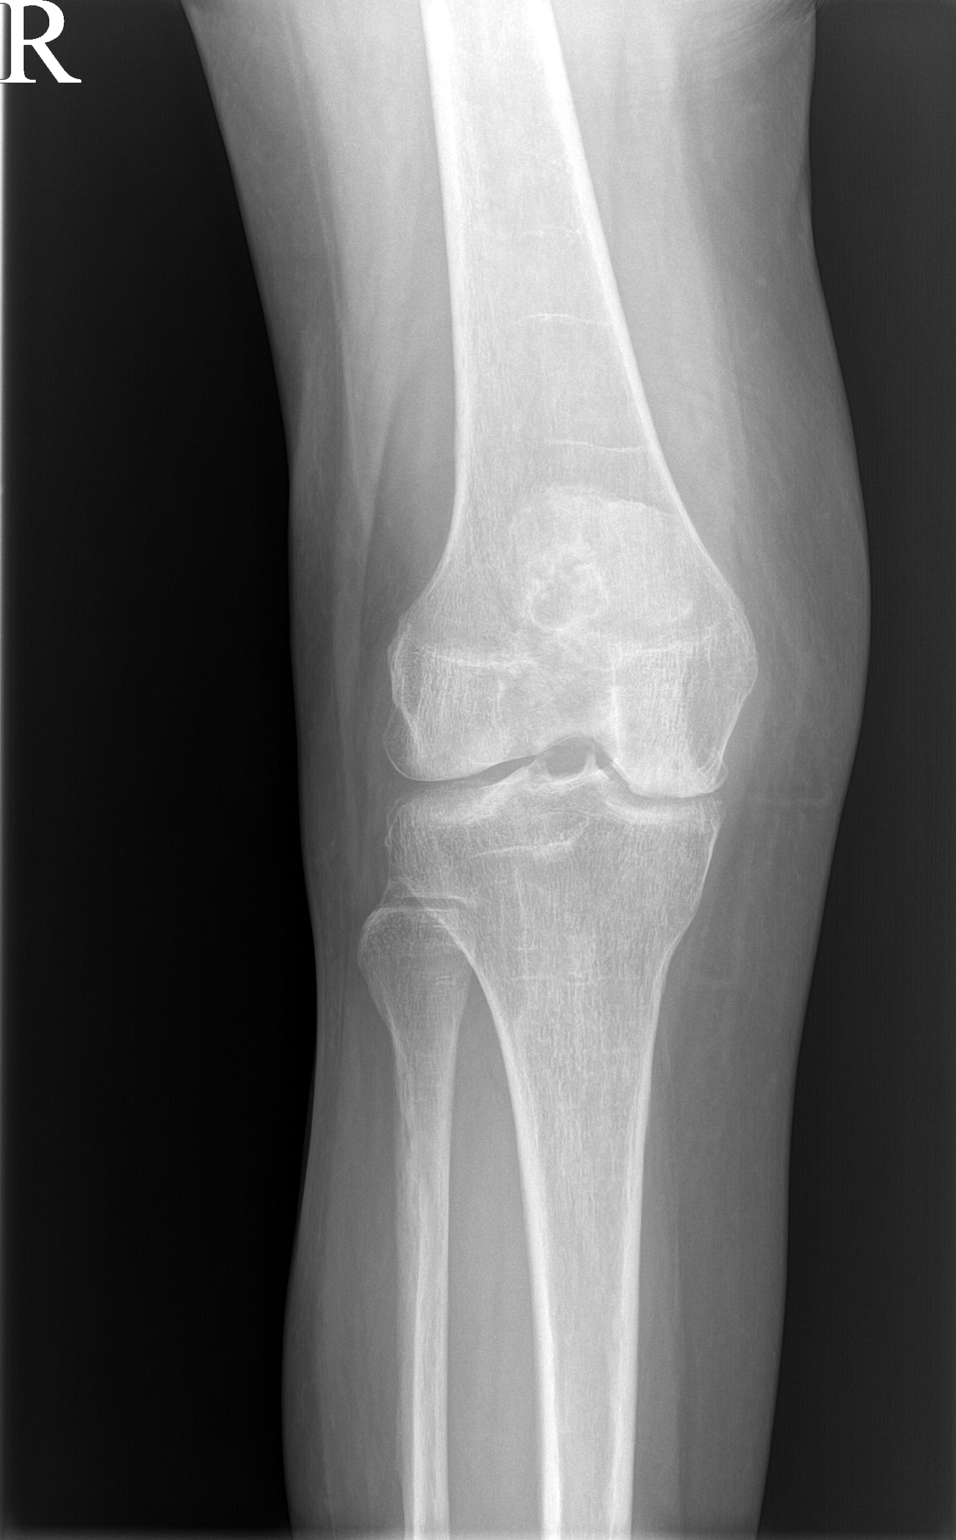

[knee lat]
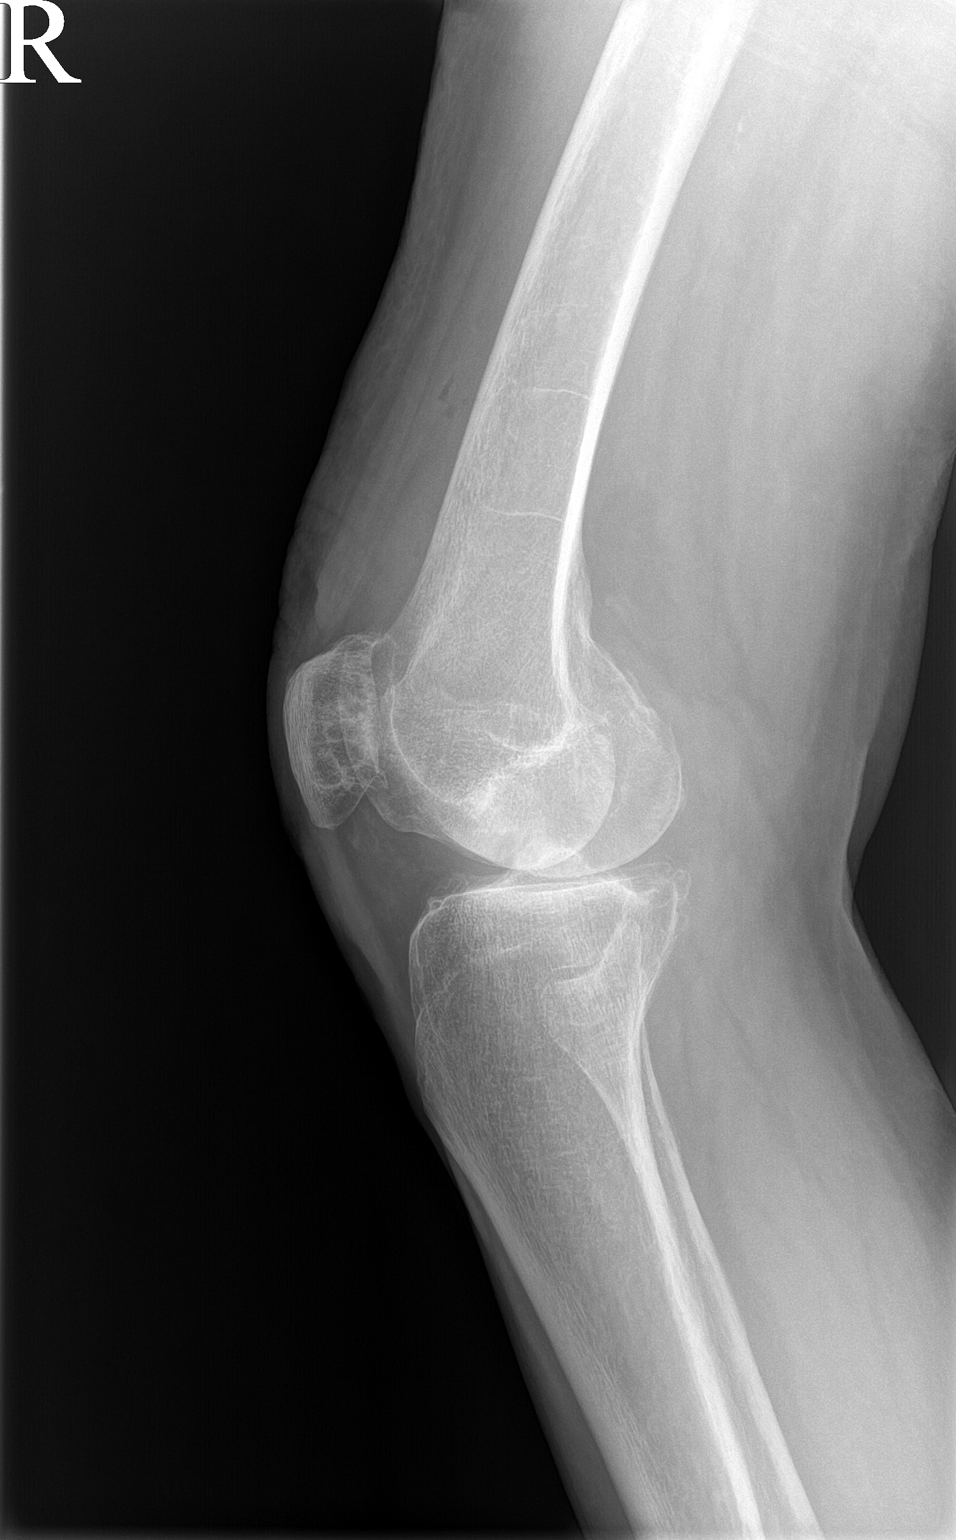

[patella]
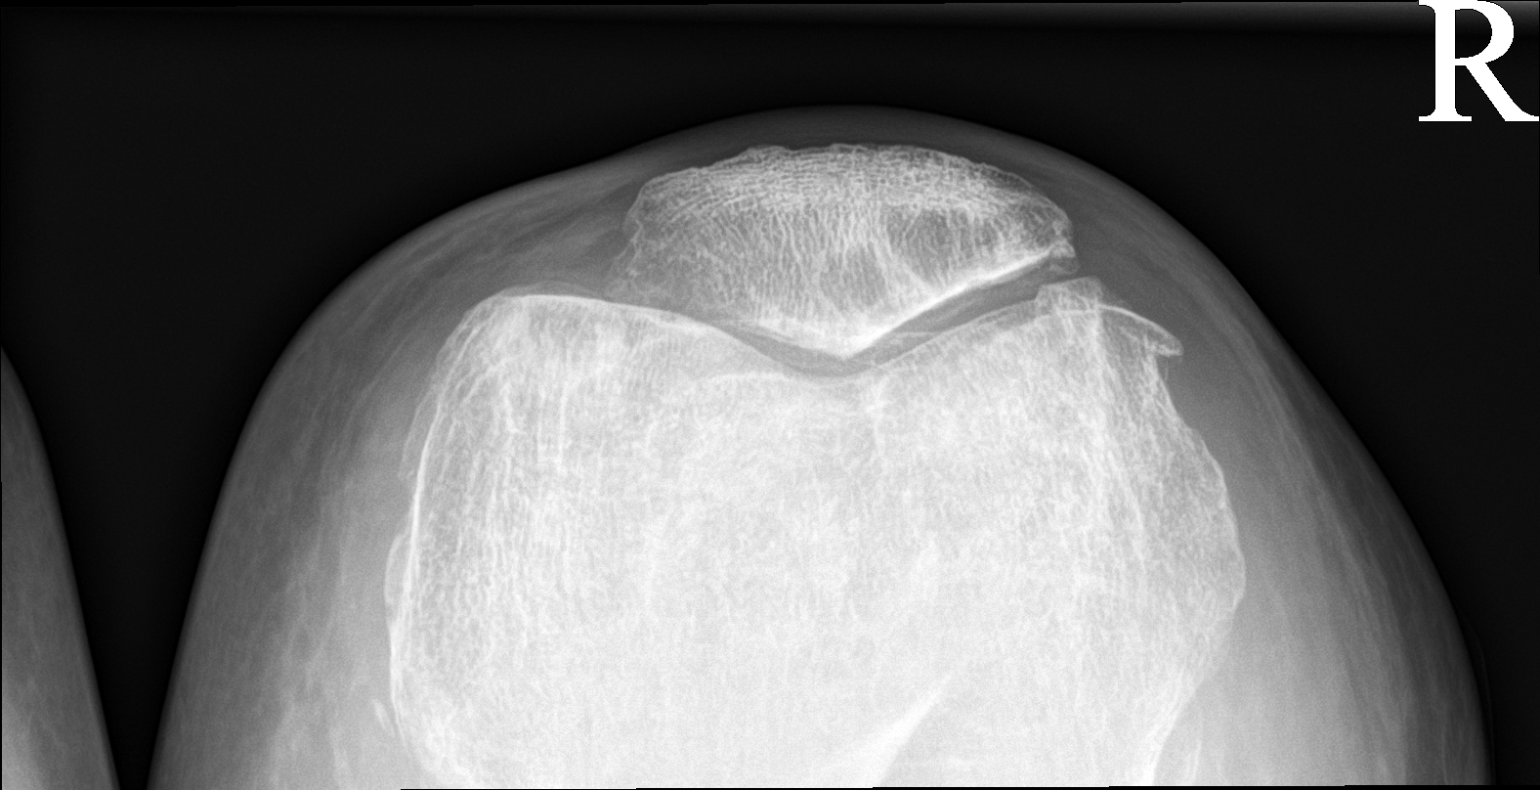

[3 of 3 positions shown; findings below may reference images not displayed]

FINDINGS: No evidence of an acute fracture or dislocation. Moderate to marked
severity medial and lateral tibiofemoral compartment space narrowing
is seen. Marked severity patellofemoral narrowing is also noted.
Periarticular sclerosis is seen with mild medial marginal osteophyte
formation. There is a small joint effusion.
IMPRESSION: 1. Moderate to marked severity tricompartmental degenerative
changes.
2. Small joint effusion.

## 2022-10-15 ENCOUNTER — Other Ambulatory Visit: Payer: Self-pay | Admitting: Family Medicine

## 2022-10-15 DIAGNOSIS — I10 Essential (primary) hypertension: Secondary | ICD-10-CM

## 2022-10-31 ENCOUNTER — Encounter (INDEPENDENT_AMBULATORY_CARE_PROVIDER_SITE_OTHER): Payer: Self-pay

## 2022-11-05 ENCOUNTER — Other Ambulatory Visit: Payer: Self-pay

## 2022-11-05 ENCOUNTER — Encounter: Payer: Self-pay | Admitting: Family Medicine

## 2022-11-05 ENCOUNTER — Ambulatory Visit: Payer: Medicare Other | Admitting: Family Medicine

## 2022-11-05 VITALS — BP 142/84 | HR 56 | Ht 63.0 in | Wt 141.0 lb

## 2022-11-05 DIAGNOSIS — M25562 Pain in left knee: Secondary | ICD-10-CM

## 2022-11-05 DIAGNOSIS — M25561 Pain in right knee: Secondary | ICD-10-CM | POA: Diagnosis not present

## 2022-11-05 DIAGNOSIS — G8929 Other chronic pain: Secondary | ICD-10-CM | POA: Diagnosis not present

## 2022-11-05 DIAGNOSIS — M170 Bilateral primary osteoarthritis of knee: Secondary | ICD-10-CM | POA: Diagnosis not present

## 2022-11-05 NOTE — Progress Notes (Unsigned)
I, Stevenson Clinch, CMA acting as a scribe for [name], MD.   CHEN MINSER is a 77 y.o. female who presents to [facility] at [facility] today for cont'd bilateral knee pain. 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 Pt was last seen by Dr. Denyse Amass on 07/17/22 and was given repeat bilat knee steroid injections.  Today, pt reports worsening knee pain over the past 2 weeks. Has been very busy recently helping to take care of her 65 yo mother who was recently dx with Dementia. Pt notes swelling and mechanical sx, denies fall.   Dx imaging: 12/25/20 R & L knee XR 12/31/17 L knee MRI  Pertinent review of systems: No fevers or chills  Relevant historical information: Hypertension   Exam:  BP (!) 142/84   Pulse (!) 56   Ht 5\' 3"  (1.6 m)   Wt 141 lb (64 kg)   SpO2 99%   BMI 24.98 kg/m  General: Well Developed, well nourished, and in no acute distress.   MSK: Bilateral knees moderate effusion normal motion.    Lab and Radiology Results  Procedure: Real-time Ultrasound Guided Injection of right knee joint superior lateral patella space Device: Philips Affiniti 50G/GE Logiq Images permanently stored and available for review in PACS Verbal informed consent obtained.  Discussed risks and benefits of procedure. Warned about infection, bleeding, hyperglycemia damage to structures among others. Patient expresses understanding and agreement Time-out conducted.   Noted no overlying erythema, induration, or other signs of local infection.   Skin prepped in a sterile fashion.   Local anesthesia: Topical Ethyl chloride.   With sterile technique and under real time ultrasound guidance: 40 mg of Kenalog and 2 mL of Marcaine injected into knee joint. Fluid seen entering the joint capsule.   Completed without difficulty   Pain immediately resolved suggesting accurate placement of the medication.   Advised to call if fevers/chills, erythema, induration,  drainage, or persistent bleeding.   Images permanently stored and available for review in the ultrasound unit.  Impression: Technically successful ultrasound guided injection.   Procedure: Real-time Ultrasound Guided Injection of left knee joint superior lateral patella space Device: Philips Affiniti 50G/GE Logiq Images permanently stored and available for review in PACS Verbal informed consent obtained.  Discussed risks and benefits of procedure. Warned about infection, bleeding, hyperglycemia damage to structures among others. Patient expresses understanding and agreement Time-out conducted.   Noted no overlying erythema, induration, or other signs of local infection.   Skin prepped in a sterile fashion.   Local anesthesia: Topical Ethyl chloride.   With sterile technique and under real time ultrasound guidance: 40 mg of Kenalog and 2 mL of Marcaine injected into knee joint. Fluid seen entering the joint capsule.   Completed without difficulty   Pain immediately resolved suggesting accurate placement of the medication.   Advised to call if fevers/chills, erythema, induration, drainage, or persistent bleeding.   Images permanently stored and available for review in the ultrasound unit.  Impression: Technically successful ultrasound guided injection.        Assessment and Plan: 77 y.o. female with bilateral knee pain due to exacerbation of DJD.  Plan for steroid injection today.  Can repeat this injection every 3 months if needed.  If needed we could also try Zilretta.  She has done gel shots in the past which did not work.Marland Kitchen   PDMP not reviewed this encounter. Orders Placed This Encounter  Procedures   [place] LIMITED JOINT SPACE  STRUCTURES LOW BILAT(NO LINKED CHARGES)    Order Specific Question:   Reason for Exam (SYMPTOM  OR DIAGNOSIS REQUIRED)    Answer:   bilat knee pain    Order Specific Question:   Preferred imaging location?    Answer:   [facility]    No orders of the defined types were placed in this encounter.    Discussed warning signs or symptoms. Please see discharge instructions. Patient expresses understanding.   The above documentation has been reviewed and is accurate and complete [name], M.D.

## 2022-11-05 NOTE — Patient Instructions (Addendum)
Thank you for coming in today.   You received an injection today. Seek immediate medical attention if the joint becomes red, extremely painful, or is oozing fluid.   Check back as needed 

## 2022-11-10 ENCOUNTER — Other Ambulatory Visit: Payer: Self-pay | Admitting: Family Medicine

## 2022-11-10 DIAGNOSIS — I10 Essential (primary) hypertension: Secondary | ICD-10-CM

## 2022-12-07 ENCOUNTER — Other Ambulatory Visit: Payer: Self-pay | Admitting: Family Medicine

## 2022-12-07 DIAGNOSIS — I6523 Occlusion and stenosis of bilateral carotid arteries: Secondary | ICD-10-CM

## 2022-12-07 DIAGNOSIS — E7800 Pure hypercholesterolemia, unspecified: Secondary | ICD-10-CM

## 2022-12-09 ENCOUNTER — Ambulatory Visit (INDEPENDENT_AMBULATORY_CARE_PROVIDER_SITE_OTHER): Payer: Medicare Other | Admitting: Family Medicine

## 2022-12-09 ENCOUNTER — Encounter: Payer: Self-pay | Admitting: Family Medicine

## 2022-12-09 VITALS — BP 138/86 | HR 56 | Temp 97.9°F | Ht 63.0 in | Wt 140.0 lb

## 2022-12-09 DIAGNOSIS — Z23 Encounter for immunization: Secondary | ICD-10-CM | POA: Diagnosis not present

## 2022-12-09 DIAGNOSIS — I10 Essential (primary) hypertension: Secondary | ICD-10-CM | POA: Diagnosis not present

## 2022-12-09 MED ORDER — METOPROLOL TARTRATE 50 MG PO TABS
50.0000 mg | ORAL_TABLET | Freq: Two times a day (BID) | ORAL | 1 refills | Status: DC
Start: 2022-12-09 — End: 2023-06-26

## 2022-12-09 NOTE — Progress Notes (Signed)
Established Patient Office Visit   Subjective:  Patient ID: Lori Allison, female    DOB: 02-Jul-1945  Age: 77 y.o. MRN: [id number]  Chief Complaint  Patient presents with   Medical Management of Chronic Issues    3 month follow up. Pt is fasting No concerns. Want flu shot today.     HPI Encounter Diagnoses  Name Primary?   Need for immunization against influenza Yes   White coat syndrome with diagnosis of hypertension    Essential hypertension    For follow-up of above.  Blood pressure at home has been running in the low 120s to 130s over 70s to 80s.  When she took the chlorthalidone systolic pressure would drop down to 90 and below.   Review of Systems  Constitutional: Negative.   HENT: Negative.    Eyes:  Negative for blurred vision, discharge and redness.  Respiratory: Negative.    Cardiovascular: Negative.   Gastrointestinal:  Negative for abdominal pain.  Genitourinary: Negative.   Musculoskeletal: Negative.  Negative for myalgias.  Skin:  Negative for rash.  Neurological:  Negative for dizziness, tingling, loss of consciousness, weakness and headaches.  Endo/Heme/Allergies:  Negative for polydipsia.     Current Outpatient Medications:    alendronate (FOSAMAX) 70 MG tablet, TAKE 1 TABLET(70 MG) BY MOUTH EVERY 7 DAYS WITH A FULL GLASS OF WATER AND ON AN EMPTY STOMACH, Disp: 12 tablet, Rfl: 4   ALPRAZolam (XANAX) 0.5 MG tablet, TAKE 1 TABLET(0.5 MG) BY MOUTH AT BEDTIME AS NEEDED, Disp: 30 tablet, Rfl: 0   aspirin EC 81 MG tablet, Take 1 tablet (81 mg total) by mouth daily., Disp: 365 tablet, Rfl: 1   azelastine (ASTELIN) 0.1 % nasal spray, Place 1 spray into both nostrils 2 (two) times daily. As needed for post nasal drip, Disp: 30 mL, Rfl: 12   Calcium Carbonate-Vitamin D (OSCAL 500/200 D-3 PO), Take 1 tablet by mouth daily., Disp: , Rfl:    diclofenac sodium (VOLTAREN) 1 % GEL, Apply 2 g topically 3 (three) times daily as needed., Disp: 100 g, Rfl: 1   escitalopram  (LEXAPRO) 20 MG tablet, Take 1 tablet (20 mg total) by mouth daily., Disp: 90 tablet, Rfl: 1   ezetimibe (ZETIA) 10 MG tablet, TAKE 1 TABLET(10 MG) BY MOUTH DAILY, Disp: 90 tablet, Rfl: 3   Multiple Vitamin (MULTIVITAMIN) tablet, Take 1 tablet by mouth daily., Disp: , Rfl:    omega-3 acid ethyl esters (LOVAZA) 1 g capsule, Take by mouth daily., Disp: , Rfl:    simvastatin (ZOCOR) 40 MG tablet, TAKE 1 TABLET(40 MG) BY MOUTH AT BEDTIME, Disp: 90 tablet, Rfl: 3   metoprolol tartrate (LOPRESSOR) 50 MG tablet, Take 1 tablet (50 mg total) by mouth 2 (two) times daily., Disp: 180 tablet, Rfl: 1   Objective:     BP 138/86   Pulse (!) 56   Temp 97.9 F (36.6 C)   Ht 5\' 3"  (1.6 m)   Wt 140 lb (63.5 kg)   SpO2 98%   BMI 24.80 kg/m    Physical Exam Constitutional:      General: She is not in acute distress.    Appearance: Normal appearance. She is not ill-appearing, toxic-appearing or diaphoretic.  HENT:     Head: Normocephalic and atraumatic.     Right Ear: External ear normal.     Left Ear: External ear normal.  Eyes:     General: No scleral icterus.       Right eye: No  discharge.        Left eye: No discharge.     Extraocular Movements: Extraocular movements intact.     Conjunctiva/sclera: Conjunctivae normal.  Cardiovascular:     Rate and Rhythm: Normal rate and regular rhythm.  Pulmonary:     Effort: Pulmonary effort is normal. No respiratory distress.     Breath sounds: Normal breath sounds.  Skin:    General: Skin is warm and dry.  Neurological:     Mental Status: She is alert and oriented to person, place, and time.  Psychiatric:        Mood and Affect: Mood normal.        Behavior: Behavior normal.      No results found for any visits on 12/09/22.    The 10-year ASCVD risk score (Arnett DK, et al., 2019) is: 29.6%    Assessment & Plan:   Need for immunization against influenza -     Flu Vaccine Trivalent High Dose (Fluad)  White coat syndrome with diagnosis  of hypertension  Essential hypertension -     Metoprolol Tartrate; Take 1 tablet (50 mg total) by mouth 2 (two) times daily.  Dispense: 180 tablet; Refill: 1    Return in about 6 months (around 06/08/2023), or if symptoms worsen or fail to improve, for Bring your BP cuff with you next visit,  Please.  Information was given on managing his hypertension.  Continue checking blood pressure periodically.  Mliss Sax, MD

## 2022-12-25 ENCOUNTER — Encounter: Payer: Self-pay | Admitting: Family Medicine

## 2023-01-06 ENCOUNTER — Other Ambulatory Visit: Payer: Self-pay | Admitting: Family Medicine

## 2023-01-06 DIAGNOSIS — F419 Anxiety disorder, unspecified: Secondary | ICD-10-CM

## 2023-01-09 ENCOUNTER — Other Ambulatory Visit: Payer: Self-pay | Admitting: Family Medicine

## 2023-01-09 DIAGNOSIS — F419 Anxiety disorder, unspecified: Secondary | ICD-10-CM

## 2023-01-09 MED ORDER — ALPRAZOLAM 0.5 MG PO TABS: ORAL_TABLET | ORAL | 0 refills | Status: DC

## 2023-02-05 ENCOUNTER — Other Ambulatory Visit: Payer: Self-pay

## 2023-02-05 ENCOUNTER — Ambulatory Visit: Payer: Medicare Other | Admitting: Family Medicine

## 2023-02-05 VITALS — BP 134/78 | HR 63 | Ht 63.0 in | Wt 146.0 lb

## 2023-02-05 DIAGNOSIS — M170 Bilateral primary osteoarthritis of knee: Secondary | ICD-10-CM

## 2023-02-05 DIAGNOSIS — G8929 Other chronic pain: Secondary | ICD-10-CM | POA: Diagnosis not present

## 2023-02-05 DIAGNOSIS — M25561 Pain in right knee: Secondary | ICD-10-CM | POA: Diagnosis not present

## 2023-02-05 DIAGNOSIS — M25562 Pain in left knee: Secondary | ICD-10-CM | POA: Diagnosis not present

## 2023-02-05 NOTE — Progress Notes (Signed)
Rubin Payor, PhD, LAT, ATC acting as a scribe for [name], MD.  Lori Allison is a 77 y.o. female who presents to [facility] at [facility] today for cont'd bilat knee pain. Pt was last seen by Dr. Denyse Amass on 11/05/22 and was given bilat knee steroid injections.  Today, pt reports prior steroid injections lasted about 2 months. She notes she didn't get much benefit from prior gel injections. She has been on her feet a lot, doing a lot to take care of her ailing 44 y/o mom.   She has tried gel shots in the past and did not think they worked all that well.  Dx imaging: 12/25/20 R & L knee XR   12/31/17 L knee MRI  Pertinent review of systems: No fevers or chills  Relevant historical information: Osteoporosis   Exam:  BP 134/78   Pulse 63   Ht 5\' 3"  (1.6 m)   Wt 146 lb (66.2 kg)   SpO2 96%   BMI 25.86 kg/m  General: Well Developed, well nourished, and in no acute distress.   MSK: Bilateral knees moderate effusion.  Normal motion.  Antalgic gait.    Lab and Radiology Results  Procedure: Real-time Ultrasound Guided Injection of right knee joint superior lateral patella space Device: Philips Affiniti 50G/GE Logiq Images permanently stored and available for review in PACS Verbal informed consent obtained.  Discussed risks and benefits of procedure. Warned about infection, bleeding, hyperglycemia damage to structures among others. Patient expresses understanding and agreement Time-out conducted.   Noted no overlying erythema, induration, or other signs of local infection.   Skin prepped in a sterile fashion.   Local anesthesia: Topical Ethyl chloride.   With sterile technique and under real time ultrasound guidance: 40 mg of Kenalog and 2 mL of Marcaine injected into knee joint. Fluid seen entering the joint capsule.   Completed without difficulty   Pain immediately resolved suggesting accurate placement of the medication.   Advised to call if  fevers/chills, erythema, induration, drainage, or persistent bleeding.   Images permanently stored and available for review in the ultrasound unit.  Impression: Technically successful ultrasound guided injection.   Procedure: Real-time Ultrasound Guided Injection of left knee joint superior lateral patella space Device: Philips Affiniti 50G/GE Logiq Images permanently stored and available for review in PACS Verbal informed consent obtained.  Discussed risks and benefits of procedure. Warned about infection, bleeding, hyperglycemia damage to structures among others. Patient expresses understanding and agreement Time-out conducted.   Noted no overlying erythema, induration, or other signs of local infection.   Skin prepped in a sterile fashion.   Local anesthesia: Topical Ethyl chloride.   With sterile technique and under real time ultrasound guidance: 40 mg of Kenalog and 2 mL of Marcaine injected into knee joint. Fluid seen entering the joint capsule.   Completed without difficulty   Pain immediately resolved suggesting accurate placement of the medication.   Advised to call if fevers/chills, erythema, induration, drainage, or persistent bleeding.   Images permanently stored and available for review in the ultrasound unit.  Impression: Technically successful ultrasound guided injection.       Assessment and Plan: 77 y.o. female with bilateral knee pain due to DJD.  Plan for steroid injection today.  Will go and schedule for just under 3 months and anticipate injection then.  She is going vacation and may.  Time in this out we can do injections now, February, and right before vacation in May.  She will need a knee replacement before too long but she cannot take time away from caring for her mother.  PDMP not reviewed this encounter. Orders Placed This Encounter  Procedures   [place] LIMITED JOINT SPACE STRUCTURES LOW BILAT(NO LINKED CHARGES)    Order Specific Question:   Reason for Exam  (SYMPTOM  OR DIAGNOSIS REQUIRED)    Answer:   bilateral knee pain    Order Specific Question:   Preferred imaging location?    Answer:   [facility]   No orders of the defined types were placed in this encounter.    Discussed warning signs or symptoms. Please see discharge instructions. Patient expresses understanding.   The above documentation has been reviewed and is accurate and complete [name], M.D.

## 2023-02-05 NOTE — Patient Instructions (Signed)
Thank you for coming in today.   You received an injection today. Seek immediate medical attention if the joint becomes red, extremely painful, or is oozing fluid.   Schedule a follow up for 3 months

## 2023-04-25 ENCOUNTER — Other Ambulatory Visit: Payer: Self-pay | Admitting: Family Medicine

## 2023-04-25 DIAGNOSIS — M810 Age-related osteoporosis without current pathological fracture: Secondary | ICD-10-CM

## 2023-04-27 ENCOUNTER — Other Ambulatory Visit: Payer: Self-pay | Admitting: Family Medicine

## 2023-04-27 DIAGNOSIS — F419 Anxiety disorder, unspecified: Secondary | ICD-10-CM

## 2023-04-30 ENCOUNTER — Ambulatory Visit: Payer: Medicare Other | Admitting: Family Medicine

## 2023-04-30 ENCOUNTER — Other Ambulatory Visit: Payer: Self-pay

## 2023-04-30 ENCOUNTER — Encounter: Payer: Self-pay | Admitting: Family Medicine

## 2023-04-30 VITALS — BP 150/82 | HR 61 | Ht 63.0 in | Wt 140.0 lb

## 2023-04-30 DIAGNOSIS — M170 Bilateral primary osteoarthritis of knee: Secondary | ICD-10-CM

## 2023-04-30 DIAGNOSIS — M25561 Pain in right knee: Secondary | ICD-10-CM | POA: Diagnosis not present

## 2023-04-30 DIAGNOSIS — M25562 Pain in left knee: Secondary | ICD-10-CM

## 2023-04-30 DIAGNOSIS — G8929 Other chronic pain: Secondary | ICD-10-CM | POA: Diagnosis not present

## 2023-04-30 NOTE — Progress Notes (Signed)
I, Stevenson Clinch, CMA acting as a scribe for [name], MD.  Lori Allison is a 78 y.o. female who presents to [facility] today for 69-month f/u bilat knee pain. Pt was last seen by Dr. Denyse Amass on 02/05/23 and was given bilat knee steroid injections.  Today, pt reports worsening knee sx over the past 3 weeks. Swelling present bilaterally, R>L. Locates pain to deep in the joint. Mechanical sx present. Denies radicular sx. Taking Tylenol prn with short-term relief.   Dx imaging: 12/25/20 R & L knee XR   12/31/17 L knee MRI  Pertinent review of systems: No fevers or chills  Relevant historical information: Hypertension. Her mother is now in a memory care unit.  She is experiencing a bit of guilt about this but understands it is the best option.   Exam:  BP (!) 150/82   Pulse 61   Ht 5\' 3"  (1.6 m)   Wt 140 lb (63.5 kg)   SpO2 98%   BMI 24.80 kg/m  General: Well Developed, well nourished, and in no acute distress.   MSK: Bilateral knees moderate joint effusion.  Normal motion with crepitation.    Lab and Radiology Results  Procedure: Real-time Ultrasound Guided Injection of right knee joint superior lateral patella space Device: Philips Affiniti 50G/GE Logiq Images permanently stored and available for review in PACS Verbal informed consent obtained.  Discussed risks and benefits of procedure. Warned about infection, bleeding, hyperglycemia damage to structures among others. Patient expresses understanding and agreement Time-out conducted.   Noted no overlying erythema, induration, or other signs of local infection.   Skin prepped in a sterile fashion.   Local anesthesia: Topical Ethyl chloride.   With sterile technique and under real time ultrasound guidance: 40 mg of Kenalog and 2 mL of Marcaine injected into knee joint. Fluid seen entering the joint capsule.   Completed without difficulty   Pain immediately resolved suggesting accurate  placement of the medication.   Advised to call if fevers/chills, erythema, induration, drainage, or persistent bleeding.   Images permanently stored and available for review in the ultrasound unit.  Impression: Technically successful ultrasound guided injection.   Procedure: Real-time Ultrasound Guided Injection of left knee joint superior lateral patella space Device: Philips Affiniti 50G/GE Logiq Images permanently stored and available for review in PACS Verbal informed consent obtained.  Discussed risks and benefits of procedure. Warned about infection, bleeding, hyperglycemia damage to structures among others. Patient expresses understanding and agreement Time-out conducted.   Noted no overlying erythema, induration, or other signs of local infection.   Skin prepped in a sterile fashion.   Local anesthesia: Topical Ethyl chloride.   With sterile technique and under real time ultrasound guidance: 40 mg of Kenalog and 2 mL of Marcaine injected into knee joint. Fluid seen entering the joint capsule.   Completed without difficulty   Pain immediately resolved suggesting accurate placement of the medication.   Advised to call if fevers/chills, erythema, induration, drainage, or persistent bleeding.   Images permanently stored and available for review in the ultrasound unit.  Impression: Technically successful ultrasound guided injection.        Assessment and Plan: 78 y.o. 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  Plan for steroid injection today.  Recheck in 3 months.  Anticipate cortisone shot at that visit.  She has a vacation coming up in middle May so we can get her shot and get her feeling better prior to  that trip if needed.  Spent time talking about her mom as well.   PDMP not reviewed this encounter. Orders Placed This Encounter  Procedures   [place] LIMITED JOINT SPACE STRUCTURES LOW BILAT(NO LINKED CHARGES)    Reason for Exam (SYMPTOM  OR DIAGNOSIS REQUIRED):    bilat knee pain    Preferred imaging location?:   [facility]   No orders of the defined types were placed in this encounter.    Discussed warning signs or symptoms. Please see discharge instructions. Patient expresses understanding.   The above documentation has been reviewed and is accurate and complete [name], M.D.

## 2023-04-30 NOTE — Patient Instructions (Addendum)
Thank you for coming in today.  You received an injection today. Seek immediate medical attention if the joint becomes red, extremely painful, or is oozing fluid.  Recheck in 3 months.

## 2023-05-30 ENCOUNTER — Ambulatory Visit: Payer: Medicare Other

## 2023-05-30 DIAGNOSIS — Z0000 Encounter for general adult medical examination without abnormal findings: Secondary | ICD-10-CM | POA: Diagnosis not present

## 2023-05-30 NOTE — Patient Instructions (Signed)
 Lori Allison , Thank you for taking time to come for your Medicare Wellness Visit. I appreciate your ongoing commitment to your health goals. Please review the following plan we discussed and let me know if I can assist you in the future.   Referrals/Orders/Follow-Ups/Clinician Recommendations: none  This is a list of the screening recommended for you and due dates:  Health Maintenance  Topic Date Due   Medicare Annual Wellness Visit  05/29/2024   Pneumonia Vaccine  Completed   Flu Shot  Completed   DEXA scan (bone density measurement)  Completed   HPV Vaccine  Aged Out   DTaP/Tdap/Td vaccine  Discontinued   COVID-19 Vaccine  Discontinued   Hepatitis C Screening  Discontinued   Zoster (Shingles) Vaccine  Discontinued    Advanced directives: (Copy Requested) Please bring a copy of your health care power of attorney and living will to the office to be added to your chart at your convenience. You can mail to [facility] [address] [place], [place] [address] or email to [email or web address]  Next Medicare Annual Wellness Visit scheduled for next year: Yes  insert Preventive Care attachment Insert FALL PREVENTION attachment if needed

## 2023-05-30 NOTE — Progress Notes (Signed)
 Subjective:   Lori Allison is a 78 y.o. who presents for a Medicare Wellness preventive visit.  Visit Complete: Virtual I connected with  Lori Allison on 05/30/23 by a audio enabled telemedicine application and verified that I am speaking with the correct person using two identifiers.  Patient Location: Home  Provider Location: Office/Clinic  I discussed the limitations of evaluation and management by telemedicine. The patient expressed understanding and agreed to proceed.  Vital Signs: Because this visit was a virtual/telehealth visit, some criteria may be missing or patient reported. Any vitals not documented were not able to be obtained and vitals that have been documented are patient reported.  VideoError- Librarian, academic were attempted between this provider and patient, however failed, due to patient having technical difficulties OR patient did not have access to video capability.  We continued and completed visit with audio only.   Persons Participating in Visit: n/a  AWV Allison: Yes: Patient Medicare AWV Allison was completed by the patient on 05/26/2023; I have confirmed that all information answered by patient is correct and no changes since this date.  Cardiac Risk Factors include: advanced age (>45men, >52 women);hypertension     Objective:    Today's Vitals   There is no height or weight on file to calculate BMI.     05/30/2023   11:29 AM 05/27/2022   11:30 AM 05/23/2021    2:38 PM 05/09/2020    9:11 AM 05/05/2019    1:14 PM  Advanced Directives  Does Patient Have a Medical Advance Directive? No No No No No  Would patient like information on creating a medical advance directive? No - Patient declined  No - Patient declined Yes (MAU/Ambulatory/Procedural Areas - Information given) No - Patient declined    Current Medications (verified) Outpatient Encounter Medications as of 05/30/2023  Medication Sig   alendronate (FOSAMAX)  70 MG tablet TAKE 1 TABLET(70 MG) BY MOUTH EVERY 7 DAYS WITH A FULL GLASS OF WATER AND ON AN EMPTY STOMACH   ALPRAZolam (XANAX) 0.5 MG tablet TAKE 1 TABLET(0.5 MG) BY MOUTH AT BEDTIME AS NEEDED   aspirin EC 81 MG tablet Take 1 tablet (81 mg total) by mouth daily.   azelastine (ASTELIN) 0.1 % nasal spray Place 1 spray into both nostrils 2 (two) times daily. As needed for post nasal drip   Calcium Carbonate-Vitamin D (OSCAL 500/200 D-3 PO) Take 1 tablet by mouth daily.   diclofenac sodium (VOLTAREN) 1 % GEL Apply 2 g topically 3 (three) times daily as needed.   escitalopram (LEXAPRO) 20 MG tablet Take 1 tablet (20 mg total) by mouth daily.   ezetimibe (ZETIA) 10 MG tablet TAKE 1 TABLET(10 MG) BY MOUTH DAILY   metoprolol tartrate (LOPRESSOR) 50 MG tablet Take 1 tablet (50 mg total) by mouth 2 (two) times daily.   Multiple Vitamin (MULTIVITAMIN) tablet Take 1 tablet by mouth daily.   omega-3 acid ethyl esters (LOVAZA) 1 g capsule Take by mouth daily.   simvastatin (ZOCOR) 40 MG tablet TAKE 1 TABLET(40 MG) BY MOUTH AT BEDTIME   No facility-administered encounter medications on file as of 05/30/2023.    Allergies (verified) Carvedilol phosphate er   History: Past Medical History:  Diagnosis Date   Chicken pox    Hay fever    Hyperlipidemia    Hypertension    UTI (urinary tract infection)    Past Surgical History:  Procedure Laterality Date   EYE SURGERY Bilateral 07/17/2018   Family  History  Problem Relation Age of Onset   High blood pressure Mother    Breast cancer Mother    Heart disease Father    High blood pressure Sister    Early death Brother    Social History   Socioeconomic History   Marital status: Divorced    Spouse name: Not on file   Number of children: Not on file   Years of education: Not on file   Highest education level: 12th grade  Occupational History   Occupation: retired  Tobacco Use   Smoking status: Former    Current packs/day: 0.00    Types:  Cigarettes    Quit date: 2008    Years since quitting: 17.2   Smokeless tobacco: Former  Building services engineer status: Never Used  Substance and Sexual Activity   Alcohol use: Not Currently   Drug use: Never   Sexual activity: Not on file  Other Topics Concern   Not on file  Social History Narrative   Not on file   Social Drivers of Health   Allison Resource Strain: Low Risk  (05/26/2023)   Overall Allison Resource Strain (CARDIA)    Difficulty of Paying Living Expenses: Not hard at all  Food Insecurity: No Food Insecurity (05/26/2023)   Hunger Vital Sign    Worried About Running Out of Food in the Last Year: Never true    Ran Out of Food in the Last Year: Never true  Transportation Needs: No Transportation Needs (05/26/2023)   PRAPARE - Administrator, Civil Service (Medical): No    Lack of Transportation (Non-Medical): No  Physical Activity: Unknown (05/26/2023)   Exercise Vital Sign    Days of Exercise per Week: Patient declined    Minutes of Exercise per Session: 30 min  Stress: Stress Concern Present (05/26/2023)   Lori Allison    Feeling of Stress : To some extent  Social Connections: Moderately Isolated (05/26/2023)   Social Connection and Isolation Panel [NHANES]    Frequency of Communication with Friends and Family: More than three times a week    Frequency of Social Gatherings with Friends and Family: Once a week    Attends Religious Services: More than 4 times per year    Active Member of Lori Allison or Organizations: No    Attends Engineer, structural: Not on file    Marital Status: Divorced    Tobacco Counseling Counseling given: Not Answered    Clinical Intake:  Pre-visit preparation completed: Yes  Pain : No/denies pain     Nutritional Risks: None Diabetes: No  How often do you need to have someone help you when you read instructions, pamphlets, or other written  materials from your doctor or pharmacy?: 1 - Never  Interpreter Needed?: No  Information entered by :: Lori Allison   Activities of Daily Living     05/26/2023    3:30 PM  In your present state of health, do you have any difficulty performing the following activities:  Hearing? 0  Vision? 0  Difficulty concentrating or making decisions? 0  Walking or climbing stairs? 1  Comment due to knees  Dressing or bathing? 0  Doing errands, shopping? 0  Preparing Food and eating ? N  Using the Toilet? N  In the past six months, have you accidently leaked urine? N  Do you have problems with loss of bowel control? N  Managing your Medications? N  Managing your Finances? N  Housekeeping or managing your Housekeeping? N    Patient Care Team: Mliss Sax, MD as PCP - General (Family Medicine)  Indicate any recent Medical Services you may have received from other than [facility] providers in the past year (date may be approximate).     Assessment:   This is a routine wellness examination for Lori Allison.  Hearing/Vision screen Hearing Screening - Comments:: Denies hearing issues Vision Screening - Comments:: Regular eye exams, Lori Allison   Goals Addressed             This Visit's Progress    Patient Stated       05/30/2023. Lose weight       Depression Screen     05/30/2023   11:30 AM 07/12/2022   10:30 AM 06/11/2022   10:03 AM 05/27/2022   11:30 AM 12/11/2021   10:58 AM 12/11/2021   10:16 AM 06/12/2021   11:05 AM  PHQ 2/9 Scores  PHQ - 2 Score 1 0 0 0 1 0 0  PHQ- 9 Score 1 0   3      Fall Risk     05/26/2023    3:30 PM 09/06/2022   10:00 AM 07/12/2022   10:30 AM 06/11/2022   10:03 AM 05/23/2022    2:52 PM  Fall Risk   Falls in the past year? 0 1 0 0 0  Number falls in past yr: 0 1 0 0 0  Injury with Fall? 0 1 0 0 0  Comment  Hurt both knees. Tripped over Dog     Risk for fall due to : Medication side effect History of fall(s) No Fall Risks No Fall Risks Medication side  effect  Follow up Falls prevention discussed;Falls evaluation completed Falls evaluation completed Falls evaluation completed Falls evaluation completed Falls prevention discussed;Education provided;Falls evaluation completed    MEDICARE RISK AT HOME:  Medicare Risk at Home Any stairs in or around the home?: (Patient-Rptd) Yes If so, are there any without handrails?: (Patient-Rptd) No Home free of loose throw rugs in walkways, pet beds, electrical cords, etc?: (Patient-Rptd) Yes Adequate lighting in your home to reduce risk of falls?: (Patient-Rptd) No Life alert?: (Patient-Rptd) No Use of a cane, walker or w/c?: (Patient-Rptd) No Grab bars in the bathroom?: (Patient-Rptd) Yes Shower chair or bench in shower?: (Patient-Rptd) No Elevated toilet seat or a handicapped toilet?: (Patient-Rptd) No  TIMED UP AND GO:  Was the test performed?  No  Cognitive Function: 6CIT completed        05/30/2023   11:31 AM 05/27/2022   11:32 AM  6CIT Screen  What Year? 0 points 0 points  What month? 0 points 0 points  What time? 0 points 0 points  Count back from 20 0 points 0 points  Months in reverse 0 points 0 points  Repeat phrase 0 points 0 points  Total Score 0 points 0 points    Immunizations Immunization History  Administered Date(s) Administered   Fluad Quad(high Dose 65+) 12/28/2018   Fluad Trivalent(High Dose 65+) 12/09/2022   Influenza, High Dose Seasonal PF 12/28/2013, 12/10/2017, 12/11/2021   Influenza-Unspecified 11/17/2019, 12/13/2020   PFIZER(Purple Top)SARS-COV-2 Vaccination 04/24/2019, 05/19/2019, 12/30/2019   PNEUMOCOCCAL CONJUGATE-20 12/11/2021   Pneumococcal Polysaccharide-23 05/24/2018    Screening Tests Health Maintenance  Topic Date Due   Medicare Annual Wellness (AWV)  05/29/2024   Pneumonia Vaccine 24+ Years old  Completed   INFLUENZA VACCINE  Completed   DEXA SCAN  Completed  HPV VACCINES  Aged Out   DTaP/Tdap/Td  Discontinued   COVID-19 Vaccine   Discontinued   Hepatitis C Screening  Discontinued   Zoster Vaccines- Shingrix  Discontinued    Health Maintenance  There are no preventive care reminders to display for this patient. Health Maintenance Items Addressed: Up to date  Additional Screening:  Vision Screening: Recommended annual ophthalmology exams for early detection of glaucoma and other disorders of the eye.  Dental Screening: Recommended annual dental exams for proper oral hygiene  Community Resource Referral / Chronic Care Management: CRR required this visit?  No   CCM required this visit?  No     Plan:     I have personally reviewed and noted the following in the patient's chart:   Medical and social history Use of alcohol, tobacco or illicit drugs  Current medications and supplements including opioid prescriptions. Patient is not currently taking opioid prescriptions. Functional ability and status Nutritional status Physical activity Advanced directives List of other physicians Hospitalizations, surgeries, and ER visits in previous 12 months Vitals Screenings to include cognitive, depression, and falls Referrals and appointments  In addition, I have reviewed and discussed with patient certain preventive protocols, quality metrics, and best practice recommendations. A written personalized care plan for preventive services as well as general preventive health recommendations were provided to patient.     Barb Merino, Allison   06/24/8117   After Visit Summary: (MyChart) Due to this being a telephonic visit, the after visit summary with patients personalized plan was offered to patient via MyChart   Notes: Nothing significant to report at this time.

## 2023-06-05 ENCOUNTER — Other Ambulatory Visit: Payer: Self-pay | Admitting: Family Medicine

## 2023-06-05 DIAGNOSIS — F419 Anxiety disorder, unspecified: Secondary | ICD-10-CM

## 2023-06-06 ENCOUNTER — Encounter: Payer: Self-pay | Admitting: Family Medicine

## 2023-06-06 DIAGNOSIS — R110 Nausea: Secondary | ICD-10-CM

## 2023-06-06 MED ORDER — ONDANSETRON HCL 4 MG PO TABS
4.0000 mg | ORAL_TABLET | Freq: Three times a day (TID) | ORAL | 0 refills | Status: AC | PRN
Start: 2023-06-06 — End: ?

## 2023-06-09 ENCOUNTER — Ambulatory Visit: Payer: Medicare Other | Admitting: Family Medicine

## 2023-06-26 ENCOUNTER — Encounter: Payer: Self-pay | Admitting: Family Medicine

## 2023-06-26 ENCOUNTER — Ambulatory Visit (INDEPENDENT_AMBULATORY_CARE_PROVIDER_SITE_OTHER): Admitting: Family Medicine

## 2023-06-26 VITALS — BP 118/74 | HR 69 | Temp 97.8°F | Ht 63.0 in | Wt 136.0 lb

## 2023-06-26 DIAGNOSIS — E7800 Pure hypercholesterolemia, unspecified: Secondary | ICD-10-CM | POA: Diagnosis not present

## 2023-06-26 DIAGNOSIS — Z131 Encounter for screening for diabetes mellitus: Secondary | ICD-10-CM

## 2023-06-26 DIAGNOSIS — I10 Essential (primary) hypertension: Secondary | ICD-10-CM

## 2023-06-26 DIAGNOSIS — E559 Vitamin D deficiency, unspecified: Secondary | ICD-10-CM | POA: Diagnosis not present

## 2023-06-26 DIAGNOSIS — F419 Anxiety disorder, unspecified: Secondary | ICD-10-CM

## 2023-06-26 DIAGNOSIS — F418 Other specified anxiety disorders: Secondary | ICD-10-CM | POA: Diagnosis not present

## 2023-06-26 DIAGNOSIS — M810 Age-related osteoporosis without current pathological fracture: Secondary | ICD-10-CM

## 2023-06-26 LAB — URINALYSIS, ROUTINE W REFLEX MICROSCOPIC
Bilirubin Urine: NEGATIVE
Hgb urine dipstick: NEGATIVE
Ketones, ur: NEGATIVE
Leukocytes,Ua: NEGATIVE
Nitrite: NEGATIVE
RBC / HPF: NONE SEEN (ref 0–?)
Specific Gravity, Urine: 1.01 (ref 1.000–1.030)
Total Protein, Urine: NEGATIVE
Urine Glucose: NEGATIVE
Urobilinogen, UA: 0.2 (ref 0.0–1.0)
pH: 6 (ref 5.0–8.0)

## 2023-06-26 LAB — CBC WITH DIFFERENTIAL/PLATELET
Basophils Absolute: 0.1 10*3/uL (ref 0.0–0.1)
Basophils Relative: 0.9 % (ref 0.0–3.0)
Eosinophils Absolute: 0.2 10*3/uL (ref 0.0–0.7)
Eosinophils Relative: 1.4 % (ref 0.0–5.0)
HCT: 41.7 % (ref 36.0–46.0)
Hemoglobin: 13.6 g/dL (ref 12.0–15.0)
Lymphocytes Relative: 14.1 % (ref 12.0–46.0)
Lymphs Abs: 1.6 10*3/uL (ref 0.7–4.0)
MCHC: 32.6 g/dL (ref 30.0–36.0)
MCV: 92.3 fl (ref 78.0–100.0)
Monocytes Absolute: 0.9 10*3/uL (ref 0.1–1.0)
Monocytes Relative: 7.9 % (ref 3.0–12.0)
Neutro Abs: 8.5 10*3/uL — ABNORMAL HIGH (ref 1.4–7.7)
Neutrophils Relative %: 75.7 % (ref 43.0–77.0)
Platelets: 474 10*3/uL — ABNORMAL HIGH (ref 150.0–400.0)
RBC: 4.52 Mil/uL (ref 3.87–5.11)
RDW: 14.4 % (ref 11.5–15.5)
WBC: 11.2 10*3/uL — ABNORMAL HIGH (ref 4.0–10.5)

## 2023-06-26 LAB — LIPID PANEL
Cholesterol: 135 mg/dL (ref 0–200)
HDL: 55.5 mg/dL (ref >39.00)
LDL Cholesterol: 56 mg/dL (ref 0–99)
NonHDL: 79.99
Total CHOL/HDL Ratio: 2
Triglycerides: 122 mg/dL (ref 0.0–149.0)
VLDL: 24.4 mg/dL (ref 0.0–40.0)

## 2023-06-26 LAB — COMPREHENSIVE METABOLIC PANEL WITH GFR
ALT: 18 U/L (ref 0–35)
AST: 26 U/L (ref 0–37)
Albumin: 4 g/dL (ref 3.5–5.2)
Alkaline Phosphatase: 116 U/L (ref 39–117)
BUN: 14 mg/dL (ref 6–23)
CO2: 29 meq/L (ref 19–32)
Calcium: 8.9 mg/dL (ref 8.4–10.5)
Chloride: 99 meq/L (ref 96–112)
Creatinine, Ser: 0.56 mg/dL (ref 0.40–1.20)
GFR: 87.7 mL/min (ref >60.00)
Glucose, Bld: 109 mg/dL — ABNORMAL HIGH (ref 70–99)
Potassium: 3.8 meq/L (ref 3.5–5.1)
Sodium: 137 meq/L (ref 135–145)
Total Bilirubin: 0.5 mg/dL (ref 0.2–1.2)
Total Protein: 6.7 g/dL (ref 6.0–8.3)

## 2023-06-26 LAB — TSH: TSH: 2.45 u[IU]/mL (ref 0.35–5.50)

## 2023-06-26 LAB — HEMOGLOBIN A1C: Hgb A1c MFr Bld: 5.9 % (ref 4.6–6.5)

## 2023-06-26 MED ORDER — METOPROLOL TARTRATE 50 MG PO TABS
50.0000 mg | ORAL_TABLET | Freq: Two times a day (BID) | ORAL | 1 refills | Status: AC
Start: 2023-06-26 — End: ?

## 2023-06-26 MED ORDER — ESCITALOPRAM OXALATE 20 MG PO TABS: 20.0000 mg | ORAL_TABLET | Freq: Every day | ORAL | 1 refills | Status: DC

## 2023-06-26 NOTE — Progress Notes (Signed)
 Established Patient Office Visit   Subjective:  Patient ID: Lori Allison, female    DOB: Apr 19, 1945  Age: 77 y.o. MRN: [id number]  Chief Complaint  Patient presents with   Medical Management of Chronic Issues    6 month follow up. Pt is fasting. No concerns.     HPI Encounter Diagnoses  Name Primary?   Elevated LDL cholesterol level Yes   Essential hypertension    Vitamin D deficiency    Depression with anxiety    Age-related osteoporosis without current pathological fracture    Screening for diabetes mellitus    Anxiety    For follow-up of above.  Mother recently placed into memory care.  She is following and required emergency room evaluation.  Patient's niece is dealing with myasthenia gravis.  Continues with Fosamax and calcium and vitamin D for osteoporosis.  Needs follow-up bone scan.  Continues with escitalopram for anxiety and depression.  Alprazolam as needed.   Review of Systems  Constitutional: Negative.   HENT: Negative.    Eyes:  Negative for blurred vision, discharge and redness.  Respiratory: Negative.    Cardiovascular: Negative.   Gastrointestinal:  Negative for abdominal pain.  Genitourinary: Negative.   Musculoskeletal: Negative.  Negative for myalgias.  Skin:  Negative for rash.  Neurological:  Negative for tingling, loss of consciousness and weakness.  Endo/Heme/Allergies:  Negative for polydipsia.      06/26/2023   11:21 AM 05/30/2023   11:30 AM 07/12/2022   10:30 AM  Depression screen PHQ 2/9  Decreased Interest 1 0 0  Down, Depressed, Hopeless 1 1 0  PHQ - 2 Score 2 1 0  Altered sleeping 1 0 0  Tired, decreased energy 1 0 0  Change in appetite 0 0 0  Feeling bad or failure about yourself  0 0 0  Trouble concentrating 0 0 0  Moving slowly or fidgety/restless 0 0 0  Suicidal thoughts 0 0 0  PHQ-9 Score 4 1 0  Difficult doing work/chores Not difficult at all Not difficult at all Not difficult at all       Current Outpatient Medications:     alendronate (FOSAMAX) 70 MG tablet, TAKE 1 TABLET(70 MG) BY MOUTH EVERY 7 DAYS WITH A FULL GLASS OF WATER AND ON AN EMPTY STOMACH, Disp: 12 tablet, Rfl: 4   ALPRAZolam (XANAX) 0.5 MG tablet, TAKE 1 TABLET(0.5 MG) BY MOUTH AT BEDTIME AS NEEDED, Disp: 30 tablet, Rfl: 0   aspirin EC 81 MG tablet, Take 1 tablet (81 mg total) by mouth daily., Disp: 365 tablet, Rfl: 1   azelastine (ASTELIN) 0.1 % nasal spray, Place 1 spray into both nostrils 2 (two) times daily. As needed for post nasal drip, Disp: 30 mL, Rfl: 12   Calcium Carbonate-Vitamin D (OSCAL 500/200 D-3 PO), Take 1 tablet by mouth daily., Disp: , Rfl:    diclofenac sodium (VOLTAREN) 1 % GEL, Apply 2 g topically 3 (three) times daily as needed., Disp: 100 g, Rfl: 1   escitalopram (LEXAPRO) 20 MG tablet, Take 1 tablet (20 mg total) by mouth daily., Disp: 90 tablet, Rfl: 1   ezetimibe (ZETIA) 10 MG tablet, TAKE 1 TABLET(10 MG) BY MOUTH DAILY, Disp: 90 tablet, Rfl: 3   metoprolol tartrate (LOPRESSOR) 50 MG tablet, Take 1 tablet (50 mg total) by mouth 2 (two) times daily., Disp: 180 tablet, Rfl: 1   Multiple Vitamin (MULTIVITAMIN) tablet, Take 1 tablet by mouth daily., Disp: , Rfl:    omega-3 acid ethyl  esters (LOVAZA) 1 g capsule, Take by mouth daily., Disp: , Rfl:    ondansetron (ZOFRAN) 4 MG tablet, Take 1 tablet (4 mg total) by mouth every 8 (eight) hours as needed for nausea or vomiting., Disp: 12 tablet, Rfl: 0   simvastatin (ZOCOR) 40 MG tablet, TAKE 1 TABLET(40 MG) BY MOUTH AT BEDTIME, Disp: 90 tablet, Rfl: 3   Objective:     BP 118/74 (BP Location: Right Arm, Patient Position: Sitting, Cuff Size: Normal)   Pulse 69   Temp 97.8 F (36.6 C) (Temporal)   Ht 5\' 3"  (1.6 m)   Wt 136 lb (61.7 kg)   SpO2 100%   BMI 24.09 kg/m    Physical Exam Constitutional:      General: She is not in acute distress.    Appearance: Normal appearance. She is not ill-appearing, toxic-appearing or diaphoretic.  HENT:     Head: Normocephalic and  atraumatic.     Right Ear: External ear normal.     Left Ear: External ear normal.     Mouth/Throat:     Mouth: Mucous membranes are moist.     Pharynx: Oropharynx is clear. No oropharyngeal exudate or posterior oropharyngeal erythema.  Eyes:     General: No scleral icterus.       Right eye: No discharge.        Left eye: No discharge.     Extraocular Movements: Extraocular movements intact.     Conjunctiva/sclera: Conjunctivae normal.     Pupils: Pupils are equal, round, and reactive to light.  Cardiovascular:     Rate and Rhythm: Normal rate and regular rhythm.  Pulmonary:     Effort: Pulmonary effort is normal. No respiratory distress.     Breath sounds: Normal breath sounds. No wheezing, rhonchi or rales.  Musculoskeletal:     Cervical back: No rigidity or tenderness.  Lymphadenopathy:     Cervical: No cervical adenopathy.  Skin:    General: Skin is warm and dry.  Neurological:     Mental Status: She is alert and oriented to person, place, and time.  Psychiatric:        Mood and Affect: Mood normal.        Behavior: Behavior normal.      No results found for any visits on 06/26/23.    The 10-year ASCVD risk score (Arnett DK, et al., 2019) is: 22.6%    Assessment & Plan:   Elevated LDL cholesterol level -     Comprehensive metabolic panel with GFR -     Lipid panel  Essential hypertension -     Metoprolol Tartrate; Take 1 tablet (50 mg total) by mouth 2 (two) times daily.  Dispense: 180 tablet; Refill: 1 -     CBC with Differential/Platelet -     Comprehensive metabolic panel with GFR -     Urinalysis, Routine w reflex microscopic  Vitamin D deficiency  Depression with anxiety -     TSH  Age-related osteoporosis without current pathological fracture -     DG Bone Density; Future  Screening for diabetes mellitus -     Comprehensive metabolic panel with GFR -     Hemoglobin A1c  Anxiety -     Escitalopram Oxalate; Take 1 tablet (20 mg total) by mouth  daily.  Dispense: 90 tablet; Refill: 1    Return in about 6 months (around 12/26/2023).    Mliss Sax, MD

## 2023-07-22 ENCOUNTER — Other Ambulatory Visit: Payer: Self-pay | Admitting: Family Medicine

## 2023-07-22 DIAGNOSIS — Z1231 Encounter for screening mammogram for malignant neoplasm of breast: Secondary | ICD-10-CM

## 2023-07-28 ENCOUNTER — Encounter: Payer: Self-pay | Admitting: Family Medicine

## 2023-07-28 ENCOUNTER — Other Ambulatory Visit: Payer: Self-pay | Admitting: Family Medicine

## 2023-07-28 ENCOUNTER — Ambulatory Visit (INDEPENDENT_AMBULATORY_CARE_PROVIDER_SITE_OTHER)

## 2023-07-28 ENCOUNTER — Ambulatory Visit (INDEPENDENT_AMBULATORY_CARE_PROVIDER_SITE_OTHER): Payer: Medicare Other | Admitting: Family Medicine

## 2023-07-28 ENCOUNTER — Other Ambulatory Visit: Payer: Self-pay

## 2023-07-28 VITALS — BP 138/88 | HR 66 | Ht 63.0 in | Wt 141.0 lb

## 2023-07-28 DIAGNOSIS — G8929 Other chronic pain: Secondary | ICD-10-CM

## 2023-07-28 DIAGNOSIS — F419 Anxiety disorder, unspecified: Secondary | ICD-10-CM

## 2023-07-28 DIAGNOSIS — M170 Bilateral primary osteoarthritis of knee: Secondary | ICD-10-CM

## 2023-07-28 DIAGNOSIS — M25461 Effusion, right knee: Secondary | ICD-10-CM

## 2023-07-28 DIAGNOSIS — M25562 Pain in left knee: Secondary | ICD-10-CM | POA: Diagnosis not present

## 2023-07-28 DIAGNOSIS — M25561 Pain in right knee: Secondary | ICD-10-CM | POA: Diagnosis not present

## 2023-07-28 NOTE — Patient Instructions (Addendum)
 Thank you for coming in today.   You received an injection today. Seek immediate medical attention if the joint becomes red, extremely painful, or is oozing fluid.   Please get an Xray today before you leave   We will work to authorize the gel shots

## 2023-07-28 NOTE — Progress Notes (Signed)
 I, Lori Allison, CMA acting as a scribe for Lori Juniper, MD.  Lori Allison is a 78 y.o. female who presents to [facility] at [facility] today for 38-month f/u bilat knee pain. Pt was last seen by Dr. [name] on 04/30/23 and was given repeat bilat knee steroid injections.  Today, pt reports minimal relief with last intra-articular Triamcinolone  injection. C/O swelling in both knees and both feet. Notes that knee sx feel localized. Mechanical sx present. Denies fall since last visit. Using heat/ice and taking Tylenol or IBU prn.   Dx imaging: 12/25/20 R & L knee XR   12/31/17 L knee MRI   Pertinent review of systems: No fevers or chills  Relevant historical information: Peripheral vascular disease.  Osteoporosis.   Exam:  BP 138/88   Pulse 66   Ht 5\' 3"  (1.6 m)   Wt 141 lb (64 kg)   SpO2 100%   BMI 24.98 kg/m  General: Well Developed, well nourished, and in no acute distress.   MSK: Knees bilaterally moderate effusion right knee mild effusion left knee decreased motion.    Lab and Radiology Results  Procedure: Real-time Ultrasound Guided Injection of left knee joint superior lateral patella space Device: Philips Affiniti 50G/GE Logiq Images permanently stored and available for review in PACS Verbal informed consent obtained.  Discussed risks and benefits of procedure. Warned about infection, bleeding, hyperglycemia damage to structures among others. Patient expresses understanding and agreement Time-out conducted.   Noted no overlying erythema, induration, or other signs of local infection.   Skin prepped in a sterile fashion.   Local anesthesia: Topical Ethyl chloride.   With sterile technique and under real time ultrasound guidance: 40 mg of Kenalog  and 2 mL of Marcaine injected into knee joint. Fluid seen entering the joint capsule.   Completed without difficulty   Pain immediately resolved suggesting accurate placement of the medication.   Advised to  call if fevers/chills, erythema, induration, drainage, or persistent bleeding.   Images permanently stored and available for review in the ultrasound unit.  Impression: Technically successful ultrasound guided injection.   Procedure: Real-time Ultrasound Guided aspiration and injection right knee effusion Device: Philips Affiniti 50G/GE Logiq Images permanently stored and available for review in PACS Verbal informed consent obtained.  Discussed risks and benefits of procedure. Warned about infection, bleeding, hyperglycemia damage to structures among others. Patient expresses understanding and agreement Time-out conducted.   Noted no overlying erythema, induration, or other signs of local infection.   Skin prepped in a sterile fashion.   Local anesthesia: Topical Ethyl chloride.   With sterile technique and under real time ultrasound guidance: 2 mL of lidocaine injected into subcutaneous tissue along planned aspiration tract achieving good anesthesia. Skin was again sterilized with isopropyl alcohol. 18-gauge needle was used to access the joint effusion. 20 mL of slightly cloudy yellow fluid was aspirated. Syringe was exchanged for 40 mg of Kenalog  and 2 mm of Marcaine were injected into the now decompressed knee joint. Completed without difficulty   Pain immediately resolved suggesting accurate placement of the medication.   Advised to call if fevers/chills, erythema, induration, drainage, or persistent bleeding.   Images permanently stored and available for review in the ultrasound unit.  Impression: Technically successful ultrasound guided aspiration and injection.   X-ray images bilateral knees obtained today personally and independently interpreted.  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  Await  formal radiology review     Assessment and Plan: 78 y.o. female with bilateral knee  pain due to exacerbation of DJD.  She is having bothersome effusions and pain.  The right knee has a larger joint effusion is more painful.  Plan for aspiration and injection of the right knee and a simple steroid injection left knee.  If this does not work very well we will go ahead and start on gel injections.  Will authorize them now in preparation.  If gel injections do not work knee replacement may be her best option. The fluid removed from the right knee was a bit cloudy.  Plan for cell count and differential and crystal analysis and culture.  PDMP not reviewed this encounter. Orders Placed This Encounter  Procedures   Anaerobic and Aerobic Culture    Synovial fluid    Standing Status:   Future    Number of Occurrences:   1    Expiration Date:   07/27/2024   US  LIMITED JOINT SPACE STRUCTURES LOW BILAT(NO LINKED CHARGES)    Reason for Exam (SYMPTOM  OR DIAGNOSIS REQUIRED):   bilat knee pain / OA    Preferred imaging location?:   [facility] Knee AP/LAT W/[place] Left    Standing Status:   Future    Number of Occurrences:   1    Expiration Date:   08/28/2023    Reason for Exam (SYMPTOM  OR DIAGNOSIS REQUIRED):   bilateral knee pain    Preferred imaging location?:   [facility]   DG Knee AP/LAT W/[place] Right    Standing Status:   Future    Number of Occurrences:   1    Expiration Date:   08/28/2023    Reason for Exam (SYMPTOM  OR DIAGNOSIS REQUIRED):   bilateral knee pain    Preferred imaging location?:   [place]   Synovial Fluid Analysis, Complete    Synovial fluid    Standing Status:   Future    Number of Occurrences:   1    Expiration Date:   07/27/2024   No orders of the defined types were placed in this encounter.    Discussed warning signs or symptoms. Please see discharge instructions. Patient expresses understanding.   The above documentation has been reviewed and is accurate and complete Lori Allison, M.D.

## 2023-07-29 ENCOUNTER — Ambulatory Visit: Payer: Self-pay | Admitting: Family Medicine

## 2023-07-29 NOTE — Progress Notes (Signed)
 Left knee x-ray shows severe arthritis.

## 2023-07-29 NOTE — Progress Notes (Signed)
 Cell count and differential does not indicate infection yet.  Culture is still pending.  No evidence of gout

## 2023-07-29 NOTE — Progress Notes (Signed)
 Right knee your x-ray shows severe arthritis.

## 2023-07-31 ENCOUNTER — Telehealth: Payer: Self-pay

## 2023-07-31 NOTE — Telephone Encounter (Signed)
 Can you please schedule patient when medication is in stock. Thank you    Gelsyn authorized for bilateral knee Patient is covered at 100% of the allowable amount Deductible has been met  OOP does not apply to these services No pre cert, medical notes, or referrals needed  Reference number [id number] R [id number] 07/29/23-01/29/24

## 2023-08-01 NOTE — Telephone Encounter (Signed)
 Patient had cortisone injections on 5/12. How soon should she proceed with Gelsyn series?

## 2023-08-03 LAB — ANAEROBIC AND AEROBIC CULTURE
AER RESULT:: NO GROWTH
MICRO NUMBER:: 16442893
MICRO NUMBER:: 16442894
SPECIMEN QUALITY:: ADEQUATE
SPECIMEN QUALITY:: ADEQUATE

## 2023-08-03 LAB — SYNOVIAL FLUID ANALYSIS, COMPLETE
Basophils, %: 0 %
Eosinophils-Synovial: 0 % (ref 0–2)
Lymphocytes-Synovial Fld: 1 % (ref 0–74)
Monocyte/Macrophage: 3 % (ref 0–69)
Neutrophil, Synovial: 96 % — ABNORMAL HIGH (ref 0–24)
Synoviocytes, %: 0 % (ref 0–15)
WBC, Synovial: 23605 {cells}/uL — ABNORMAL HIGH (ref <150)

## 2023-08-03 NOTE — Progress Notes (Signed)
 No bacteria grew in the fluid removed from your joint.

## 2023-08-04 NOTE — Telephone Encounter (Signed)
 Can do gel shots at any point if pt is still hurting. Ideally, would prefer to wait > 2 weeks after steroid.

## 2023-08-12 NOTE — Telephone Encounter (Signed)
 Spoke to patient.  Her knees are feeling good. She will call to schedule.

## 2023-08-14 ENCOUNTER — Ambulatory Visit
Admission: RE | Admit: 2023-08-14 | Discharge: 2023-08-14 | Disposition: A | Discharge status: Home / Self Care | Source: Ambulatory Visit | Attending: Family Medicine | Admitting: Family Medicine

## 2023-08-14 DIAGNOSIS — Z1231 Encounter for screening mammogram for malignant neoplasm of breast: Secondary | ICD-10-CM

## 2023-09-06 ENCOUNTER — Other Ambulatory Visit: Payer: Self-pay | Admitting: Family Medicine

## 2023-09-06 DIAGNOSIS — I739 Peripheral vascular disease, unspecified: Secondary | ICD-10-CM

## 2023-09-06 DIAGNOSIS — E7800 Pure hypercholesterolemia, unspecified: Secondary | ICD-10-CM

## 2023-11-05 ENCOUNTER — Other Ambulatory Visit: Payer: Self-pay | Admitting: Family Medicine

## 2023-11-05 DIAGNOSIS — F419 Anxiety disorder, unspecified: Secondary | ICD-10-CM

## 2023-11-18 ENCOUNTER — Other Ambulatory Visit: Payer: Self-pay

## 2023-11-18 ENCOUNTER — Ambulatory Visit: Admitting: Family Medicine

## 2023-11-18 ENCOUNTER — Encounter: Payer: Self-pay | Admitting: Family Medicine

## 2023-11-18 VITALS — BP 156/80 | HR 60 | Ht 63.0 in | Wt 136.0 lb

## 2023-11-18 DIAGNOSIS — M25562 Pain in left knee: Secondary | ICD-10-CM

## 2023-11-18 DIAGNOSIS — G8929 Other chronic pain: Secondary | ICD-10-CM | POA: Diagnosis not present

## 2023-11-18 DIAGNOSIS — M170 Bilateral primary osteoarthritis of knee: Secondary | ICD-10-CM

## 2023-11-18 DIAGNOSIS — M25561 Pain in right knee: Secondary | ICD-10-CM | POA: Diagnosis not present

## 2023-11-18 MED ORDER — SODIUM HYALURONATE (VISCOSUP) 16.8 MG/2ML IX SOSY
16.8000 mg | PREFILLED_SYRINGE | Freq: Once | INTRA_ARTICULAR | Status: AC
  Administered 2023-11-18: 16.8 mg via INTRA_ARTICULAR

## 2023-11-18 NOTE — Progress Notes (Signed)
 LILLETTE Ileana Collet, PhD, LAT, ATC acting as a scribe for Artist Lloyd, MD.  Lori Allison is a 78 y.o. female who presents to [facility] at [facility] today for bilat knee pain. Pt was last seen by Dr. Lloyd on 07/28/23 and was given a L knee steroid injection and her R knee was aspirated and injected. Synovial fluid was sent for culture  Today, pt reports both knees are very painful today. Walking very gingerly. She is wanting to proceed w/ gel shots today.  Dx imaging: 07/28/23 R & L knee XR 10/10//22 R & L knee XR   12/31/17 L knee MRI  Pertinent review of systems: No fevers or chills  Relevant historical information: Peripheral vascular disease and hypertension.   Exam:  BP (!) 156/80   Pulse 60   Ht 5' 3 (1.6 m)   Wt 136 lb (61.7 kg)   SpO2 98%   BMI 24.09 kg/m  General: Well Developed, well nourished, and in no acute distress.   MSK: Knees bilaterally mild effusion normal motion with crepitation.    Lab and Radiology Results  Lori Allison presents to clinic today for Gelsyn injection bilateral knee 1/3 Procedure: Real-time Ultrasound Guided Injection of right knee joint superior lateral patellar space Device: Philips Affiniti 50G/GE Logiq Images permanently stored and available for review in PACS Verbal informed consent obtained.  Discussed risks and benefits of procedure. Warned about infection, bleeding, damage to structures among others. Patient expresses understanding and agreement Time-out conducted.   Noted no overlying erythema, induration, or other signs of local infection.   Skin prepped in a sterile fashion.   Local anesthesia: Topical Ethyl chloride.   With sterile technique and under real time ultrasound guidance: Gelsyn 2 mL injected into knee joint. Fluid seen entering the joint capsule.   Completed without difficulty   Advised to call if fevers/chills, erythema, induration, drainage, or persistent bleeding.   Images permanently stored  and available for review in the ultrasound unit.  Impression: Technically successful ultrasound guided injection.  Procedure: Real-time Ultrasound Guided Injection of left knee joint superior lateral patellar space Device: Philips Affiniti 50G/GE Logiq Images permanently stored and available for review in PACS Verbal informed consent obtained.  Discussed risks and benefits of procedure. Warned about infection, bleeding, damage to structures among others. Patient expresses understanding and agreement Time-out conducted.   Noted no overlying erythema, induration, or other signs of local infection.   Skin prepped in a sterile fashion.   Local anesthesia: Topical Ethyl chloride.   With sterile technique and under real time ultrasound guidance: Gelsyn 2 mL injected into knee joint. Fluid seen entering the joint capsule.   Completed without difficulty   Advised to call if fevers/chills, erythema, induration, drainage, or persistent bleeding.   Images permanently stored and available for review in the ultrasound unit.  Impression: Technically successful ultrasound guided injection. Lot number: [id number]    Assessment and Plan: 78 y.o. female with bilateral knee pain due to DJD.  Plan for Gelsyn injection both knees today.  Return in 1 week for Gelsyn injection both knees 2/3.   PDMP not reviewed this encounter. Orders Placed This Encounter  Procedures   US  LIMITED JOINT SPACE STRUCTURES LOW BILAT(NO LINKED CHARGES)    Reason for Exam (SYMPTOM  OR DIAGNOSIS REQUIRED):   bilateral knee pain    Preferred imaging location?:   [facility] ordered this encounter  Medications   sodium hyaluronate (viscosup) (  GELSYN-3) intra-articular injection 16.8 mg   sodium hyaluronate (viscosup) (GELSYN-3) intra-articular injection 16.8 mg     Discussed warning signs or symptoms. Please see discharge instructions. Patient expresses understanding.   The above documentation  has been reviewed and is accurate and complete Artist Lloyd, M.D.

## 2023-11-18 NOTE — Patient Instructions (Signed)
 Thank you for coming in today.   You received an injection today. Seek immediate medical attention if the joint becomes red, extremely painful, or is oozing fluid.   We will see you next week for the 2nd gel shot

## 2023-11-25 ENCOUNTER — Ambulatory Visit (INDEPENDENT_AMBULATORY_CARE_PROVIDER_SITE_OTHER): Admitting: Family Medicine

## 2023-11-25 ENCOUNTER — Other Ambulatory Visit: Payer: Self-pay

## 2023-11-25 DIAGNOSIS — M170 Bilateral primary osteoarthritis of knee: Secondary | ICD-10-CM

## 2023-11-25 MED ORDER — SODIUM HYALURONATE (VISCOSUP) 16.8 MG/2ML IX SOSY
16.8000 mg | PREFILLED_SYRINGE | Freq: Once | INTRA_ARTICULAR | Status: AC
  Administered 2023-11-25: 16.8 mg via INTRA_ARTICULAR

## 2023-11-25 NOTE — Patient Instructions (Signed)
Thank you for coming in today.   You received an injection today. Seek immediate medical attention if the joint becomes red, extremely painful, or is oozing fluid.   We will see you next week for the 3rd gel shot 

## 2023-11-25 NOTE — Progress Notes (Signed)
 Lori Allison presents to clinic today for Gelsyn injection right knee 2/3 Procedure: Real-time Ultrasound Guided Injection of right knee joint superior lateral patellar space Device: Philips Affiniti 50G/GE Logiq Images permanently stored and available for review in PACS Verbal informed consent obtained.  Discussed risks and benefits of procedure. Warned about infection, bleeding, damage to structures among others. Patient expresses understanding and agreement Time-out conducted.   Noted no overlying erythema, induration, or other signs of local infection.   Skin prepped in a sterile fashion.   Local anesthesia: Topical Ethyl chloride.   With sterile technique and under real time ultrasound guidance: Gelsyn 2 mL injected into knee joint. Fluid seen entering the joint capsule.   Completed without difficulty   Advised to call if fevers/chills, erythema, induration, drainage, or persistent bleeding.   Images permanently stored and available for review in the ultrasound unit.  Impression: Technically successful ultrasound guided injection.  Procedure: Real-time Ultrasound Guided Injection of left knee joint superior lateral patellar space Device: Philips Affiniti 50G/GE Logiq Images permanently stored and available for review in PACS Verbal informed consent obtained.  Discussed risks and benefits of procedure. Warned about infection, bleeding, damage to structures among others. Patient expresses understanding and agreement Time-out conducted.   Noted no overlying erythema, induration, or other signs of local infection.   Skin prepped in a sterile fashion.   Local anesthesia: Topical Ethyl chloride.   With sterile technique and under real time ultrasound guidance: Gelsyn 2 mL injected into knee joint. Fluid seen entering the joint capsule.   Completed without difficulty   Advised to call if fevers/chills, erythema, induration, drainage, or persistent bleeding.   Images permanently stored and  available for review in the ultrasound unit.  Impression: Technically successful ultrasound guided injection. Lot number: [id number] Return in 1 week for Gelsyn injection bilateral knees 3/3

## 2023-12-01 ENCOUNTER — Other Ambulatory Visit: Payer: Self-pay | Admitting: Family Medicine

## 2023-12-01 DIAGNOSIS — E7800 Pure hypercholesterolemia, unspecified: Secondary | ICD-10-CM

## 2023-12-01 DIAGNOSIS — I6523 Occlusion and stenosis of bilateral carotid arteries: Secondary | ICD-10-CM

## 2023-12-02 ENCOUNTER — Ambulatory Visit (INDEPENDENT_AMBULATORY_CARE_PROVIDER_SITE_OTHER): Admitting: Family Medicine

## 2023-12-02 ENCOUNTER — Other Ambulatory Visit: Payer: Self-pay

## 2023-12-02 DIAGNOSIS — M25561 Pain in right knee: Secondary | ICD-10-CM

## 2023-12-02 DIAGNOSIS — M25562 Pain in left knee: Secondary | ICD-10-CM

## 2023-12-02 DIAGNOSIS — M170 Bilateral primary osteoarthritis of knee: Secondary | ICD-10-CM

## 2023-12-02 DIAGNOSIS — G8929 Other chronic pain: Secondary | ICD-10-CM | POA: Diagnosis not present

## 2023-12-02 MED ORDER — SODIUM HYALURONATE (VISCOSUP) 16.8 MG/2ML IX SOSY
16.8000 mg | PREFILLED_SYRINGE | Freq: Once | INTRA_ARTICULAR | Status: AC
  Administered 2023-12-02: 16.8 mg via INTRA_ARTICULAR

## 2023-12-02 NOTE — Progress Notes (Signed)
 Lori Allison presents to clinic today for Gelsyn injection bilateral knee 3/3 Procedure: Real-time Ultrasound Guided Injection of right knee joint superior lateral patellar space Device: Philips Affiniti 50G/GE Logiq Images permanently stored and available for review in PACS Verbal informed consent obtained.  Discussed risks and benefits of procedure. Warned about infection, bleeding, damage to structures among others. Patient expresses understanding and agreement Time-out conducted.   Noted no overlying erythema, induration, or other signs of local infection.   Skin prepped in a sterile fashion.   Local anesthesia: Topical Ethyl chloride.   With sterile technique and under real time ultrasound guidance: Gelsyn 2 mL injected into knee joint. Fluid seen entering the joint capsule.   Completed without difficulty   Advised to call if fevers/chills, erythema, induration, drainage, or persistent bleeding.   Images permanently stored and available for review in the ultrasound unit.  Impression: Technically successful ultrasound guided injection.  Procedure: Real-time Ultrasound Guided Injection of left knee joint superior lateral patellar space Device: Philips Affiniti 50G/GE Logiq Images permanently stored and available for review in PACS Verbal informed consent obtained.  Discussed risks and benefits of procedure. Warned about infection, bleeding, damage to structures among others. Patient expresses understanding and agreement Time-out conducted.   Noted no overlying erythema, induration, or other signs of local infection.   Skin prepped in a sterile fashion.   Local anesthesia: Topical Ethyl chloride.   With sterile technique and under real time ultrasound guidance: Gelsyn 2 mL injected into knee joint. Fluid seen entering the joint capsule.   Completed without difficulty   Advised to call if fevers/chills, erythema, induration, drainage, or persistent bleeding.   Images permanently stored  and available for review in the ultrasound unit.  Impression: Technically successful ultrasound guided injection. Lot number: [id number] for both injections  Unfortunately she is not feeling much better today on her third injection.  If not better within a week or 2 she can return and we can do aspiration and injection with steroids.  If that does not work we could consider genicular artery embolization.  We discussed this option and handout was provided.

## 2023-12-02 NOTE — Patient Instructions (Signed)
 Thank you for coming in today.   You received an injection today. Seek immediate medical attention if the joint becomes red, extremely painful, or is oozing fluid.

## 2023-12-05 ENCOUNTER — Other Ambulatory Visit: Payer: Self-pay | Admitting: Family Medicine

## 2023-12-05 DIAGNOSIS — F419 Anxiety disorder, unspecified: Secondary | ICD-10-CM

## 2023-12-23 ENCOUNTER — Encounter: Payer: Self-pay | Admitting: Family Medicine

## 2023-12-23 ENCOUNTER — Other Ambulatory Visit: Payer: Self-pay

## 2023-12-23 ENCOUNTER — Ambulatory Visit: Admitting: Family Medicine

## 2023-12-23 VITALS — BP 142/82 | HR 54 | Ht 63.0 in | Wt 136.0 lb

## 2023-12-23 DIAGNOSIS — M25561 Pain in right knee: Secondary | ICD-10-CM

## 2023-12-23 DIAGNOSIS — M25562 Pain in left knee: Secondary | ICD-10-CM | POA: Diagnosis not present

## 2023-12-23 DIAGNOSIS — M170 Bilateral primary osteoarthritis of knee: Secondary | ICD-10-CM | POA: Diagnosis not present

## 2023-12-23 DIAGNOSIS — G8929 Other chronic pain: Secondary | ICD-10-CM | POA: Diagnosis not present

## 2023-12-23 NOTE — Patient Instructions (Addendum)
 Thank you for coming in today.   You received an injection today. Seek immediate medical attention if the joint becomes red, extremely painful, or is oozing fluid.   We can repeat injections every 12 weeks, if needed.   Referral placed for consult for Genicular Artery Embolization   See you back as needed.

## 2023-12-23 NOTE — Progress Notes (Signed)
 I, Lori Allison, CMA acting as a scribe for Artist Lloyd, MD.  Lori Allison is a 78 y.o. female who presents to [facility] at [facility] today for cont'd bilat knee pain. Pt was last seen by Dr. Lloyd on 12/02/23 and completed the Gelsyn series, 3/3, bilaterally.  Today, pt reports no relief of knee sx with Gelsyn. Continues to have pain in both knees. Swelling present bilaterally. Has been taking Diuretic, notes that swelling has improved some - takes occasionally unless BP is low.   Dx imaging: 07/28/23 R & L knee XR 10/10//22 R & L knee XR   12/31/17 L knee MRI  Pertinent review of systems: No fevers or chills  Relevant historical information: Hypertension   Exam:  BP (!) 142/82   Pulse (!) 54   Ht 5' 3 (1.6 m)   Wt 136 lb (61.7 kg)   SpO2 96%   BMI 24.09 kg/m  General: Well Developed, well nourished, and in no acute distress.   MSK: Knees bilaterally moderate joint effusion both sides.  Decreased range of motion.  Antalgic gait.    Lab and Radiology Results  Procedure: Real-time Ultrasound Guided Injection of right knee joint superior lateral patella space Device: [employer]ips Affiniti 50G/GE Logiq Images permanently stored and available for review in PACS Verbal informed consent obtained.  Discussed risks and benefits of procedure. Warned about infection, bleeding, hyperglycemia damage to structures among others. Patient expresses understanding and agreement Time-out conducted.   Noted no overlying erythema, induration, or other signs of local infection.   Skin prepped in a sterile fashion.   Local anesthesia: Topical Ethyl chloride.   With sterile technique and under real time ultrasound guidance: 40 mg of Kenalog  and 2 mL of Marcaine injected into knee joint. Fluid seen entering the joint capsule.   Completed without difficulty   Pain immediately resolved suggesting accurate placement of the medication.   Advised to call if fevers/chills, erythema,  induration, drainage, or persistent bleeding.   Images permanently stored and available for review in the ultrasound unit.  Impression: Technically successful ultrasound guided injection.   Procedure: Real-time Ultrasound Guided Injection of left knee joint superior lateral patella space Device: Philips Affiniti 50G/GE Logiq Images permanently stored and available for review in PACS Verbal informed consent obtained.  Discussed risks and benefits of procedure. Warned about infection, bleeding, hyperglycemia damage to structures among others. Patient expresses understanding and agreement Time-out conducted.   Noted no overlying erythema, induration, or other signs of local infection.   Skin prepped in a sterile fashion.   Local anesthesia: Topical Ethyl chloride.   With sterile technique and under real time ultrasound guidance: 40 mg of Kenalog  and 2 mL of Marcaine injected into knee joint. Fluid seen entering the joint capsule.   Completed without difficulty   Pain immediately resolved suggesting accurate placement of the medication.   Advised to call if fevers/chills, erythema, induration, drainage, or persistent bleeding.   Images permanently stored and available for review in the ultrasound unit.  Impression: Technically successful ultrasound guided injection.        Assessment and Plan: 78 y.o. female with chronic bilateral knee pain due to DJD.  Plan for steroid injection today.  Hyaluronic acid injections did not work.  She is reluctant to consider knee replacement.  Will go ahead and refer her to interventional radiology for genicular artery embolization evaluation.   PDMP not reviewed this encounter. Orders Placed This Encounter  Procedures   US  LIMITED  JOINT SPACE STRUCTURES LOW BILAT(NO LINKED CHARGES)    Reason for Exam (SYMPTOM  OR DIAGNOSIS REQUIRED):   bilat knee pain    Preferred imaging location?:   [facility]   No orders of the defined  types were placed in this encounter.    Discussed warning signs or symptoms. Please see discharge instructions. Patient expresses understanding.   The above documentation has been reviewed and is accurate and complete Artist Lloyd, M.D.

## 2023-12-23 NOTE — Addendum Note (Signed)
 Addended by: MARDY LEOTIS RAMAN on: 12/23/2023 11:25 AM   Modules accepted: Orders

## 2023-12-29 ENCOUNTER — Ambulatory Visit: Admitting: Family Medicine

## 2023-12-29 ENCOUNTER — Encounter: Payer: Self-pay | Admitting: Family Medicine

## 2023-12-29 VITALS — BP 116/70 | HR 60 | Temp 97.5°F | Ht 63.0 in | Wt 130.4 lb

## 2023-12-29 DIAGNOSIS — F418 Other specified anxiety disorders: Secondary | ICD-10-CM

## 2023-12-29 DIAGNOSIS — M810 Age-related osteoporosis without current pathological fracture: Secondary | ICD-10-CM

## 2023-12-29 DIAGNOSIS — I10 Essential (primary) hypertension: Secondary | ICD-10-CM | POA: Diagnosis not present

## 2023-12-29 DIAGNOSIS — R7303 Prediabetes: Secondary | ICD-10-CM | POA: Diagnosis not present

## 2023-12-29 DIAGNOSIS — Z23 Encounter for immunization: Secondary | ICD-10-CM

## 2023-12-29 DIAGNOSIS — F4321 Adjustment disorder with depressed mood: Secondary | ICD-10-CM

## 2023-12-29 DIAGNOSIS — E559 Vitamin D deficiency, unspecified: Secondary | ICD-10-CM | POA: Diagnosis not present

## 2023-12-29 LAB — BASIC METABOLIC PANEL WITH GFR
BUN: 25 mg/dL — ABNORMAL HIGH (ref 6–23)
CO2: 26 meq/L (ref 19–32)
Calcium: 8.7 mg/dL (ref 8.4–10.5)
Chloride: 96 meq/L (ref 96–112)
Creatinine, Ser: 0.77 mg/dL (ref 0.40–1.20)
GFR: 73.86 mL/min (ref >60.00)
Glucose, Bld: 111 mg/dL — ABNORMAL HIGH (ref 70–99)
Potassium: 4.2 meq/L (ref 3.5–5.1)
Sodium: 132 meq/L — ABNORMAL LOW (ref 135–145)

## 2023-12-29 LAB — HEMOGLOBIN A1C: Hgb A1c MFr Bld: 6.2 % (ref 4.6–6.5)

## 2023-12-29 LAB — VITAMIN D 25 HYDROXY (VIT D DEFICIENCY, FRACTURES): VITD: 51.89 ng/mL (ref 30.00–100.00)

## 2023-12-29 NOTE — Progress Notes (Signed)
 Established Patient Office Visit   Subjective:  Patient ID: Lori Allison, female    DOB: May 12, 1945  Age: 78 y.o. MRN: [id number]  Chief Complaint  Patient presents with   Medical Management of Chronic Issues    6 month follow up. Pt is fasting.     HPI Encounter Diagnoses  Name Primary?   Depression with anxiety Yes   Immunization due    Essential hypertension    Vitamin D  deficiency    Prediabetes    Age-related osteoporosis without current pathological fracture    Grieving    Her 71-year-old dog was recently euthanized secondary to multiple chronic medical issues.  She grieves.  Having difficulty scheduling her DEXA scan.  No family or personal history of diabetes.  Continues Lexapro  for depression with anxiety.  Blood pressure well-controlled with metoprolol .   Review of Systems  Constitutional: Negative.   HENT: Negative.    Eyes:  Negative for blurred vision, discharge and redness.  Respiratory: Negative.    Cardiovascular: Negative.   Gastrointestinal:  Negative for abdominal pain.  Genitourinary: Negative.   Musculoskeletal: Negative.  Negative for myalgias.  Skin:  Negative for rash.  Neurological:  Negative for tingling, loss of consciousness and weakness.  Endo/Heme/Allergies:  Negative for polydipsia.     Current Outpatient Medications:    alendronate  (FOSAMAX ) 70 MG tablet, TAKE 1 TABLET(70 MG) BY MOUTH EVERY 7 DAYS WITH A FULL GLASS OF WATER AND ON AN EMPTY STOMACH, Disp: 12 tablet, Rfl: 4   ALPRAZolam  (XANAX ) 0.5 MG tablet, TAKE 1 TABLET(0.5 MG) BY MOUTH AT BEDTIME AS NEEDED, Disp: 30 tablet, Rfl: 0   aspirin  EC 81 MG tablet, Take 1 tablet (81 mg total) by mouth daily., Disp: 365 tablet, Rfl: 1   azelastine  (ASTELIN ) 0.1 % nasal spray, Place 1 spray into both nostrils 2 (two) times daily. As needed for post nasal drip, Disp: 30 mL, Rfl: 12   Calcium  Carbonate-Vitamin D  (OSCAL 500/200 D-3 PO), Take 1 tablet by mouth daily., Disp: , Rfl:    diclofenac  sodium  (VOLTAREN ) 1 % GEL, Apply 2 g topically 3 (three) times daily as needed., Disp: 100 g, Rfl: 1   escitalopram  (LEXAPRO ) 20 MG tablet, TAKE 1 TABLET(20 MG) BY MOUTH DAILY, Disp: 90 tablet, Rfl: 1   ezetimibe  (ZETIA ) 10 MG tablet, TAKE 1 TABLET(10 MG) BY MOUTH DAILY, Disp: 90 tablet, Rfl: 3   metoprolol  tartrate (LOPRESSOR ) 50 MG tablet, Take 1 tablet (50 mg total) by mouth 2 (two) times daily., Disp: 180 tablet, Rfl: 1   Multiple Vitamin (MULTIVITAMIN) tablet, Take 1 tablet by mouth daily., Disp: , Rfl:    omega-3 acid ethyl esters (LOVAZA) 1 g capsule, Take by mouth daily., Disp: , Rfl:    ondansetron  (ZOFRAN ) 4 MG tablet, Take 1 tablet (4 mg total) by mouth every 8 (eight) hours as needed for nausea or vomiting., Disp: 12 tablet, Rfl: 0   simvastatin  (ZOCOR ) 40 MG tablet, TAKE 1 TABLET(40 MG) BY MOUTH AT BEDTIME, Disp: 90 tablet, Rfl: 3   Objective:     BP 116/70 (BP Location: Right Arm, Patient Position: Sitting, Cuff Size: Normal)   Pulse 60   Temp (!) 97.5 F (36.4 C) (Temporal)   Ht 5' 3 (1.6 m)   Wt 130 lb 6.4 oz (59.1 kg)   SpO2 100%   BMI 23.10 kg/m    Physical Exam Constitutional:      General: She is not in acute distress.    Appearance: Normal  appearance. She is not ill-appearing, toxic-appearing or diaphoretic.  HENT:     Head: Normocephalic and atraumatic.     Right Ear: External ear normal.     Left Ear: External ear normal.     Mouth/Throat:     Mouth: Mucous membranes are moist.     Pharynx: Oropharynx is clear. No oropharyngeal exudate or posterior oropharyngeal erythema.  Eyes:     General: No scleral icterus.       Right eye: No discharge.        Left eye: No discharge.     Extraocular Movements: Extraocular movements intact.     Conjunctiva/sclera: Conjunctivae normal.     Pupils: Pupils are equal, round, and reactive to light.  Cardiovascular:     Rate and Rhythm: Normal rate and regular rhythm.  Pulmonary:     Effort: Pulmonary effort is normal. No  respiratory distress.     Breath sounds: Normal breath sounds.  Musculoskeletal:     Cervical back: No rigidity or tenderness.  Skin:    General: Skin is warm and dry.  Neurological:     Mental Status: She is alert and oriented to person, place, and time.  Psychiatric:        Mood and Affect: Mood normal.        Behavior: Behavior normal.      No results found for any visits on 12/29/23.    The 10-year ASCVD risk score (Arnett DK, et al., 2019) is: 24.1%    Assessment & Plan:   Depression with anxiety  Immunization due -     Flu vaccine HIGH DOSE PF(Fluzone Trivalent)  Essential hypertension -     Basic metabolic panel with GFR  Vitamin D  deficiency -     VITAMIN D  25 Hydroxy (Vit-D Deficiency, Fractures)  Prediabetes -     Basic metabolic panel with GFR -     Hemoglobin A1c  Age-related osteoporosis without current pathological fracture -     DG Bone Density; Future  Grieving    Return in about 6 months (around 06/28/2024).  Continue all medications as above.  Information given on preventing type 2 diabetes.  Recommended exercise as tolerated, perhaps with a recumbent bike or water aerobics.  Moderate intake of simple carbohydrates and avoid all sugary drinks.  Rechecking A1c.  Rechecking vitamin D .  Reordered DEXA scan.  Condolences for the loss of her pet.  Elsie Sim Lent, MD

## 2023-12-30 ENCOUNTER — Ambulatory Visit: Payer: Self-pay | Admitting: Family Medicine

## 2023-12-30 NOTE — Progress Notes (Signed)
 Your sodium came back a little bit low.  You are on no medications that could cause this.  Likely lab error. A1c has increased.  Please moderate intake of simple carbohydrates such as sweets.  Avoid all sugary drinks.  Please follow-up in 3 months so that we can recheck both of these.

## 2024-01-05 NOTE — Progress Notes (Signed)
 Chief Complaint: Patient was seen in consultation today for bilateral knee pain.   Referring Physician(s): Corey,Evan S  History of Present Illness: Lori Allison is a 78 y.o. female with a medical history significant for depression/anxiety, HTN, osteoporosis and bilateral knee osteoarthritis with pain and joint effusions. She has tried knee aspirations with steroid injections which yielded minimal relief. She recently underwent a series of hyaluronic injections and these did not work.   She is reluctant to consider knee replacements but she is interested in less invasive options. Dr. Joane has kindly referred the patient to Interventional Radiology for possible geniculate artery embolization.   [place] Pain Score = 46/96 VAS Pain Score = 6/10  Her right knee bothers her the most.  It swells occasionally.  She has the most pain with going up and down stairs.  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      Past Medical History:  Diagnosis Date   Chicken pox    Hay fever    Hyperlipidemia    Hypertension    UTI (urinary tract infection)     Past Surgical History:  Procedure Laterality Date   EYE SURGERY Bilateral 07/17/2018    Allergies: Carvedilol  phosphate er  Medications: Prior to Admission medications   Medication Sig Start Date End Date Taking? Authorizing Provider  alendronate  (FOSAMAX ) 70 MG tablet TAKE 1 TABLET(70 MG) BY MOUTH EVERY 7 DAYS WITH A FULL GLASS OF WATER AND ON AN EMPTY STOMACH 04/25/23   Berneta Elsie Sayre, MD  ALPRAZolam  (XANAX ) 0.5 MG tablet TAKE 1 TABLET(0.5 MG) BY MOUTH AT BEDTIME AS NEEDED 11/06/23   Berneta Elsie Sayre, MD  aspirin  EC 81 MG tablet Take 1 tablet (81 mg total) by mouth daily. 03/23/18   Berneta Elsie Sayre, MD  azelastine  (ASTELIN ) 0.1 % nasal spray Place 1 spray into both nostrils 2 (two) times daily. As needed for post nasal drip  12/11/21   Berneta Elsie Sayre, MD  Calcium  Carbonate-Vitamin D  (OSCAL 500/200 D-3 PO) Take 1 tablet by mouth daily. 03/18/18   [provider]  diclofenac  sodium (VOLTAREN ) 1 % GEL Apply 2 g topically 3 (three) times daily as needed. 04/27/18   Berneta Elsie Sayre, MD  escitalopram  (LEXAPRO ) 20 MG tablet TAKE 1 TABLET(20 MG) BY MOUTH DAILY 12/05/23   Berneta Elsie Sayre, MD  ezetimibe  (ZETIA ) 10 MG tablet TAKE 1 TABLET(10 MG) BY MOUTH DAILY 12/02/23   Berneta Elsie Sayre, MD  metoprolol  tartrate (LOPRESSOR ) 50 MG tablet Take 1 tablet (50 mg total) by mouth 2 (two) times daily. 06/26/23   Berneta Elsie Sayre, MD  Multiple Vitamin (MULTIVITAMIN) tablet Take 1 tablet by mouth daily.    [provider]  omega-3 acid ethyl esters (LOVAZA) 1 g capsule Take by mouth daily.    [provider]  ondansetron  (ZOFRAN ) 4 MG tablet Take 1 tablet (4 mg total) by mouth every 8 (eight) hours as needed for nausea or vomiting. 06/06/23   Berneta Elsie Sayre, MD  simvastatin  (ZOCOR ) 40 MG tablet TAKE 1 TABLET(40 MG) BY MOUTH AT BEDTIME 09/08/23   Berneta Elsie Sayre, MD     Family History  Problem Relation Age of Onset   High blood pressure Mother    Breast cancer Mother    Heart disease Father    High blood pressure Sister    Early death Brother     Social History  Socioeconomic History   Marital status: Divorced    Spouse name: Not on file   Number of children: Not on file   Years of education: Not on file   Highest education level: 12th grade  Occupational History   Occupation: retired  Tobacco Use   Smoking status: Former    Current packs/day: 0.00    Types: Cigarettes    Quit date: 2008    Years since quitting: 17.8   Smokeless tobacco: Former  Building services engineer status: Never Used  Substance and Sexual Activity   Alcohol use: Not Currently   Drug use: Never   Sexual activity: Not on file  Other Topics Concern   Not on file  Social History  Narrative   Not on file   Social Drivers of Health   Financial Resource Strain: Low Risk  (12/25/2023)   Overall Financial Resource Strain (CARDIA)    Difficulty of Paying Living Expenses: Not hard at all  Food Insecurity: No Food Insecurity (12/25/2023)   Hunger Vital Sign    Worried About Running Out of Food in the Last Year: Never true    Ran Out of Food in the Last Year: Never true  Transportation Needs: No Transportation Needs (12/25/2023)   PRAPARE - Administrator, Civil Service (Medical): No    Lack of Transportation (Non-Medical): No  Physical Activity: Insufficiently Active (12/25/2023)   Exercise Vital Sign    Days of Exercise per Week: 1 day    Minutes of Exercise per Session: 10 min  Stress: Stress Concern Present (12/25/2023)   [employer] of Occupational Health - Occupational Stress Questionnaire    Feeling of Stress: To some extent  Social Connections: Moderately Integrated (12/25/2023)   Social Connection and Isolation Panel    Frequency of Communication with Friends and Family: More than three times a week    Frequency of Social Gatherings with Friends and Family: More than three times a week    Attends Religious Services: More than 4 times per year    Active Member of [employer] or Organizations: Yes    Attends Banker Meetings: 1 to 4 times per year    Marital Status: Divorced    Review of Systems: A 12 point ROS discussed and pertinent positives are indicated in the HPI above.  All other systems are negative.  Vital Signs: There were no vitals taken for this visit.  Advance Care Plan: The advanced care plan/surrogate decision maker was discussed at the time of visit and documented in the medical record.   Physical Exam Constitutional:      General: She is not in acute distress. HENT:     Head: Normocephalic.     Mouth/Throat:     Mouth: Mucous membranes are moist.  Eyes:     General: No scleral icterus. Cardiovascular:     Rate  and Rhythm: Normal rate and regular rhythm.  Pulmonary:     Effort: No respiratory distress.  Abdominal:     General: There is no distension.  Musculoskeletal:     Right lower leg: No edema.     Left lower leg: No edema.  Skin:    General: Skin is warm and dry.     Findings: No erythema.  Neurological:     Mental Status: She is alert and oriented to person, place, and time.     Imaging:  Left and right knee X-rays 07/28/23       Kellgren and Jerilynn  III bilaterally    Labs:  CBC: Recent Labs    06/26/23 1125  WBC 11.2*  HGB 13.6  HCT 41.7  PLT 474.0*    COAGS: No results for input(s): INR, APTT in the last 8760 hours.  BMP: Recent Labs    06/26/23 1125 12/29/23 1050  NA 137 132*  K 3.8 4.2  CL 99 96  CO2 29 26  GLUCOSE 109* 111*  BUN 14 25*  CALCIUM  8.9 8.7  CREATININE 0.56 0.77    LIVER FUNCTION TESTS: Recent Labs    06/26/23 1125  BILITOT 0.5  AST 26  ALT 18  ALKPHOS 116  PROT 6.7  ALBUMIN 4.0    TUMOR MARKERS: No results for input(s): AFPTM, CEA, CA199, CHROMGRNA in the last 8760 hours.  Assessment and Plan:  78 year old female with a history of bilateral knee osteoarthritis with chronic knee pain refractory to conservative measures.  She is adamantly against joint replacement surgery.  She would be an excellent candidate for geniculate artery embolization.  We discussed the rationale, periprocedural expectations, and long term expected outcomes after geniculate artery embolization.  She would like to proceed.  We will plan to treat the right knee first, and if she responds well we can then proceed with contralateral treatment.  Plan for right geniculate artery embolization via antegrade right femoral artery approach with moderate sedation at [facility].    Ester Sides, MD Pager: [phone]    I spent a total of  40 Minutes   in face to face in clinical consultation, greater than 50% of which was  counseling/coordinating care for bilateral knee pain.

## 2024-01-07 ENCOUNTER — Ambulatory Visit
Admission: RE | Admit: 2024-01-07 | Discharge: 2024-01-07 | Disposition: A | Discharge status: Home / Self Care | Source: Ambulatory Visit | Attending: Family Medicine | Admitting: Family Medicine

## 2024-01-07 DIAGNOSIS — G8929 Other chronic pain: Secondary | ICD-10-CM

## 2024-01-07 DIAGNOSIS — M170 Bilateral primary osteoarthritis of knee: Secondary | ICD-10-CM

## 2024-01-07 HISTORY — PX: IR RADIOLOGIST EVAL & MGMT: IMG5224

## 2024-01-08 ENCOUNTER — Other Ambulatory Visit: Payer: Self-pay | Admitting: Interventional Radiology

## 2024-01-08 DIAGNOSIS — M1711 Unilateral primary osteoarthritis, right knee: Secondary | ICD-10-CM

## 2024-01-13 ENCOUNTER — Ambulatory Visit (INDEPENDENT_AMBULATORY_CARE_PROVIDER_SITE_OTHER)
Admission: RE | Admit: 2024-01-13 | Discharge: 2024-01-13 | Disposition: A | Discharge status: Home / Self Care | Source: Ambulatory Visit | Attending: Family Medicine | Admitting: Family Medicine

## 2024-01-13 DIAGNOSIS — M810 Age-related osteoporosis without current pathological fracture: Secondary | ICD-10-CM

## 2024-01-16 ENCOUNTER — Other Ambulatory Visit

## 2024-01-28 ENCOUNTER — Telehealth: Payer: Self-pay

## 2024-01-28 NOTE — Discharge Instructions (Signed)

## 2024-01-28 NOTE — Progress Notes (Signed)
 See telephone note, also no answer at pt's cell phone number

## 2024-01-29 ENCOUNTER — Telehealth: Payer: Self-pay

## 2024-01-29 MED ORDER — METHYLPREDNISOLONE 4 MG PO TBPK: ORAL_TABLET | ORAL | 0 refills | Status: AC

## 2024-01-29 NOTE — Progress Notes (Signed)
 See telephone note RX e-scribed

## 2024-01-29 NOTE — Progress Notes (Shared)
 Chief Complaint: Patient was seen in consultation today for right knee pain.   Referring Physician(s): Joane Artist RAMAN   Patient Status: Lori Allison - Outpatient   History of Present Illness: Lori Allison is a 78 y.o. female with a medical history significant for depression/anxiety, HTN, osteoporosis and bilateral knee osteoarthritis with pain and joint effusions. She has tried knee aspirations with steroid injections which yielded minimal relief. She recently underwent a series of hyaluronic injections and these did not work. She was reluctant to consider knee replacements but was interested in pursuing a less invasive treatment.   She was referred to Interventional Radiology for geniculate artery embolization and she presented to the clinic 01/07/24 for evaluation. She reported that her right knee bothered her the most with occasional swelling. Her pain is severe going up and down the stairs. 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   We discussed the rationale, periprocedural expectations, and long term expected outcomes after geniculate artery embolization. She wished to proceed and we decided to treat the right knee first. If she responds well we can proceed with contralateral treatment.   Past Medical History:  Diagnosis Date   Chicken pox    Hay fever    Hyperlipidemia    Hypertension    UTI (urinary tract infection)     Past Surgical History:  Procedure Laterality Date   EYE SURGERY Bilateral 07/17/2018   IR RADIOLOGIST EVAL & MGMT  01/07/2024    Allergies: Carvedilol  phosphate er  Medications: Prior to Admission medications   Medication Sig Start Date End Date Taking? Authorizing Provider  alendronate  (FOSAMAX ) 70 MG tablet TAKE 1 TABLET(70 MG) BY MOUTH EVERY 7 DAYS WITH A FULL GLASS OF WATER AND ON AN EMPTY STOMACH 04/25/23   Berneta Elsie Sayre, MD  ALPRAZolam   (XANAX ) 0.5 MG tablet TAKE 1 TABLET(0.5 MG) BY MOUTH AT BEDTIME AS NEEDED 11/06/23   Berneta Elsie Sayre, MD  aspirin  EC 81 MG tablet Take 1 tablet (81 mg total) by mouth daily. 03/23/18   Berneta Elsie Sayre, MD  azelastine  (ASTELIN ) 0.1 % nasal spray Place 1 spray into both nostrils 2 (two) times daily. As needed for post nasal drip 12/11/21   Berneta Elsie Sayre, MD  Calcium  Carbonate-Vitamin D  (OSCAL 500/200 D-3 PO) Take 1 tablet by mouth daily. 03/18/18   [provider]  diclofenac  sodium (VOLTAREN ) 1 % GEL Apply 2 g topically 3 (three) times daily as needed. 04/27/18   Berneta Elsie Sayre, MD  escitalopram  (LEXAPRO ) 20 MG tablet TAKE 1 TABLET(20 MG) BY MOUTH DAILY 12/05/23   Berneta Elsie Sayre, MD  ezetimibe  (ZETIA ) 10 MG tablet TAKE 1 TABLET(10 MG) BY MOUTH DAILY 12/02/23   Berneta Elsie Sayre, MD  methylPREDNISolone (MEDROL DOSEPAK) 4 MG TBPK tablet Take as prescribed by pharmacy 01/30/24   Jennefer Ester PARAS, MD  metoprolol  tartrate (LOPRESSOR ) 50 MG tablet Take 1 tablet (50 mg total) by mouth 2 (two) times daily. 06/26/23   Berneta Elsie Sayre, MD  Multiple Vitamin (MULTIVITAMIN) tablet Take 1 tablet by mouth daily.    [provider]  omega-3 acid ethyl esters (LOVAZA) 1 g capsule Take by mouth daily.    [provider]  ondansetron  (ZOFRAN ) 4 MG tablet Take 1 tablet (4 mg total) by mouth every 8 (eight) hours as needed for nausea or vomiting. 06/06/23   Berneta Elsie Sayre, MD  simvastatin  (  ZOCOR ) 40 MG tablet TAKE 1 TABLET(40 MG) BY MOUTH AT BEDTIME 09/08/23   Berneta Elsie Sayre, MD     Family History  Problem Relation Age of Onset   High blood pressure Mother    Breast cancer Mother    Heart disease Father    High blood pressure Sister    Early death Brother     Social History   Socioeconomic History   Marital status: Divorced    Spouse name: Not on file   Number of children: Not on file   Years of education: Not on file   Highest  education level: 12th grade  Occupational History   Occupation: retired  Tobacco Use   Smoking status: Former    Current packs/day: 0.00    Types: Cigarettes    Quit date: 2008    Years since quitting: 17.8   Smokeless tobacco: Former  Building Services Engineer status: Never Used  Substance and Sexual Activity   Alcohol use: Not Currently   Drug use: Never   Sexual activity: Not on file  Other Topics Concern   Not on file  Social History Narrative   Not on file   Social Drivers of Health   Financial Resource Strain: Allison Risk  (12/25/2023)   Overall Financial Resource Strain (CARDIA)    Difficulty of Paying Living Expenses: Not hard at all  Food Insecurity: No Food Insecurity (12/25/2023)   Hunger Vital Sign    Worried About Running Out of Food in the Last Year: Never true    Ran Out of Food in the Last Year: Never true  Transportation Needs: No Transportation Needs (12/25/2023)   PRAPARE - Administrator, Civil Service (Medical): No    Lack of Transportation (Non-Medical): No  Physical Activity: Insufficiently Active (12/25/2023)   Exercise Vital Sign    Days of Exercise per Week: 1 day    Minutes of Exercise per Session: 10 min  Stress: Stress Concern Present (12/25/2023)   [employer] of Occupational Health - Occupational Stress Questionnaire    Feeling of Stress: To some extent  Social Connections: Moderately Integrated (12/25/2023)   Social Connection and Isolation Panel    Frequency of Communication with Friends and Family: More than three times a week    Frequency of Social Gatherings with Friends and Family: More than three times a week    Attends Religious Services: More than 4 times per year    Active Member of [employer] or Organizations: Yes    Attends Banker Meetings: 1 to 4 times per year    Marital Status: Divorced    Review of Systems: A 12 point ROS discussed and pertinent positives are indicated in the HPI above.  All other systems are  negative.  Review of Systems  Vital Signs: There were no vitals taken for this visit.  Physical Exam  Imaging:  Left and right knee X-rays 07/28/23           Kellgren and Jerilynn III bilaterally    Labs:  CBC: Recent Labs    06/26/23 1125  WBC 11.2*  HGB 13.6  HCT 41.7  PLT 474.0*    COAGS: No results for input(s): INR, APTT in the last 8760 hours.  BMP: Recent Labs    06/26/23 1125 12/29/23 1050  NA 137 132*  K 3.8 4.2  CL 99 96  CO2 29 26  GLUCOSE 109* 111*  BUN 14 25*  CALCIUM  8.9 8.7  CREATININE  0.56 0.77    LIVER FUNCTION TESTS: Recent Labs    06/26/23 1125  BILITOT 0.5  AST 26  ALT 18  ALKPHOS 116  PROT 6.7  ALBUMIN 4.0    TUMOR MARKERS: No results for input(s): AFPTM, CEA, CA199, CHROMGRNA in the last 8760 hours.  Assessment and Plan:  Right Knee Pain: Lori Allison, 78 year old female, presents today for an image-guided right geniculate artery embolization.   Risks and benefits of this procedure were discussed with the patient including, but not limited to bleeding, infection, vascular injury or contrast induced renal failure.  All of the patient's questions were answered, patient is agreeable to proceed. She has been NPO.   Consent signed and in chart.  Thank you for this interesting consult.  I greatly enjoyed meeting Lori Allison and look forward to participating in their care.  A copy of this report was sent to the requesting provider on this date.  Ester Sides, MD Pager: [phone]    I spent a total of  30 Minutes   in face to face in clinical consultation, greater than 50% of which was counseling/coordinating care for right knee pain.

## 2024-01-30 ENCOUNTER — Ambulatory Visit
Admission: RE | Admit: 2024-01-30 | Discharge: 2024-01-30 | Disposition: A | Discharge status: Home / Self Care | Source: Ambulatory Visit | Attending: Interventional Radiology | Admitting: Interventional Radiology

## 2024-01-30 DIAGNOSIS — M1711 Unilateral primary osteoarthritis, right knee: Secondary | ICD-10-CM

## 2024-01-30 HISTORY — PX: IR EMBO ARTERIAL NOT HEMORR HEMANG INC GUIDE ROADMAPPING: IMG5448

## 2024-01-30 MED ORDER — IOPAMIDOL (ISOVUE-300) INJECTION 61%: 100.0000 mL | Freq: Once | INTRAVENOUS | Status: DC | PRN

## 2024-01-30 MED ORDER — KETOROLAC TROMETHAMINE 30 MG/ML IJ SOLN
30.0000 mg | Freq: Once | INTRAMUSCULAR | Status: AC
  Administered 2024-01-30: 30 mg via INTRAVENOUS

## 2024-01-30 MED ORDER — SODIUM CHLORIDE 0.9 % IV SOLN: INTRAVENOUS | Status: DC

## 2024-01-30 MED ORDER — IIOPAMIDOL (ISOVUE-250) INJECTION 51%
100.0000 mL | Freq: Once | INTRAVENOUS | Status: AC | PRN
  Administered 2024-01-30: 70 mL via INTRA_ARTERIAL

## 2024-01-30 MED ORDER — MIDAZOLAM HCL (PF) 2 MG/2ML IJ SOLN
1.0000 mg | INTRAMUSCULAR | Status: DC | PRN
  Administered 2024-01-30 (×2): 1 mg via INTRAVENOUS

## 2024-01-30 MED ORDER — LIDOCAINE-EPINEPHRINE 1 %-1:100000 IJ SOLN
10.0000 mL | Freq: Once | INTRAMUSCULAR | Status: AC
  Administered 2024-01-30: 10 mL via INTRADERMAL

## 2024-01-30 MED ORDER — ACETAMINOPHEN 10 MG/ML IV SOLN
1000.0000 mg | Freq: Once | INTRAVENOUS | Status: AC
  Administered 2024-01-30: 1000 mg via INTRAVENOUS

## 2024-01-30 MED ORDER — FENTANYL CITRATE (PF) 50 MCG/ML IJ SOSY
25.0000 ug | PREFILLED_SYRINGE | INTRAMUSCULAR | Status: DC | PRN
  Administered 2024-01-30 (×2): 50 ug via INTRAVENOUS

## 2024-01-30 MED ORDER — IOPAMIDOL (ISOVUE-300) INJECTION 61%: 30.0000 mL | Freq: Once | INTRAVENOUS | Status: DC | PRN

## 2024-01-30 MED ORDER — NITROGLYCERIN 1 MG/10 ML FOR IR/CATH LAB
100.0000 ug | INTRA_ARTERIAL | Status: DC | PRN
  Administered 2024-01-30: 100 ug via INTRA_ARTERIAL

## 2024-01-30 MED ORDER — DEXAMETHASONE SOD PHOSPHATE PF 10 MG/ML IJ SOLN
10.0000 mg | Freq: Once | INTRAMUSCULAR | Status: AC
  Administered 2024-01-30: 10 mg via INTRAVENOUS

## 2024-02-02 ENCOUNTER — Telehealth: Payer: Self-pay

## 2024-02-02 ENCOUNTER — Other Ambulatory Visit: Payer: Self-pay | Admitting: Interventional Radiology

## 2024-02-02 DIAGNOSIS — M1711 Unilateral primary osteoarthritis, right knee: Secondary | ICD-10-CM

## 2024-02-02 NOTE — Progress Notes (Signed)
 See telephone note

## 2024-02-05 ENCOUNTER — Other Ambulatory Visit: Payer: Self-pay | Admitting: Family Medicine

## 2024-02-05 DIAGNOSIS — F419 Anxiety disorder, unspecified: Secondary | ICD-10-CM

## 2024-02-20 NOTE — Progress Notes (Signed)
 Referring Physician(s): Corey,Evan S   Chief Complaint: The patient is seen in follow up today s/p right geniculate artery embolization 01/30/24.   History of present illness: HPI from initial consultation 01/07/24 Lori Allison is a 78 y.o. female with a medical history significant for depression/anxiety, HTN, osteoporosis and bilateral knee osteoarthritis with pain and joint effusions. She has tried knee aspirations with steroid injections which yielded minimal relief. She recently underwent a series of hyaluronic injections and these did not work.    She is reluctant to consider knee replacements but she is interested in less invasive options. Dr. Joane has kindly referred the patient to Interventional Radiology for possible geniculate artery embolization.    [place] Pain Score = 46/96 VAS Pain Score = 6/10   Her right knee bothers her the most.  It swells occasionally.  She has the most pain with going up and down stairs.  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  She was considered an ideal candidate for geniculate artery embolization and we discussed the rationale, periprocedural expectations, and long term expected outcomes after geniculate artery embolization. She was eager to proceed and we discussed treating the right knee first. If the response is satisfactory we can treat the left knee at a later date.   She presented to the clinic 01/30/24 and underwent a technically successful right geniculate artery embolization. She tolerated the procedure well and presents today for follow up.   Past Medical History:  Diagnosis Date   Chicken pox    Hay fever    Hyperlipidemia    Hypertension    UTI (urinary tract infection)     Past Surgical History:  Procedure Laterality Date   EYE SURGERY Bilateral 07/17/2018   IR EMBO ARTERIAL NOT HEMORR HEMANG INC GUIDE ROADMAPPING  01/30/2024    IR RADIOLOGIST EVAL & MGMT  01/07/2024    Allergies: Carvedilol  phosphate er  Medications: Prior to Admission medications   Medication Sig Start Date End Date Taking? Authorizing Provider  alendronate  (FOSAMAX ) 70 MG tablet TAKE 1 TABLET(70 MG) BY MOUTH EVERY 7 DAYS WITH A FULL GLASS OF WATER AND ON AN EMPTY STOMACH 04/25/23   Berneta Elsie Sayre, MD  ALPRAZolam  (XANAX ) 0.5 MG tablet TAKE 1 TABLET(0.5 MG) BY MOUTH AT BEDTIME AS NEEDED 02/05/24   Douglass Caul B, FNP  aspirin  EC 81 MG tablet Take 1 tablet (81 mg total) by mouth daily. 03/23/18   Berneta Elsie Sayre, MD  azelastine  (ASTELIN ) 0.1 % nasal spray Place 1 spray into both nostrils 2 (two) times daily. As needed for post nasal drip 12/11/21   Berneta Elsie Sayre, MD  Calcium  Carbonate-Vitamin D  (OSCAL 500/200 D-3 PO) Take 1 tablet by mouth daily. 03/18/18   [provider]  diclofenac  sodium (VOLTAREN ) 1 % GEL Apply 2 g topically 3 (three) times daily as needed. 04/27/18   Berneta Elsie Sayre, MD  escitalopram  (LEXAPRO ) 20 MG tablet TAKE 1 TABLET(20 MG) BY MOUTH DAILY 12/05/23   Berneta Elsie Sayre, MD  ezetimibe  (ZETIA ) 10 MG tablet TAKE 1 TABLET(10 MG) BY MOUTH DAILY 12/02/23   Berneta Elsie Sayre, MD  methylPREDNISolone  (MEDROL  DOSEPAK) 4 MG TBPK tablet Take as prescribed by pharmacy 01/30/24   Jennefer Ester PARAS, MD  metoprolol  tartrate (LOPRESSOR ) 50 MG tablet Take 1 tablet (50 mg total) by mouth 2 (two) times daily. 06/26/23   Berneta Elsie Sayre, MD  Multiple  Vitamin (MULTIVITAMIN) tablet Take 1 tablet by mouth daily.    [provider]  omega-3 acid ethyl esters (LOVAZA) 1 g capsule Take by mouth daily.    [provider]  ondansetron  (ZOFRAN ) 4 MG tablet Take 1 tablet (4 mg total) by mouth every 8 (eight) hours as needed for nausea or vomiting. 06/06/23   Berneta Elsie Sayre, MD  simvastatin  (ZOCOR ) 40 MG tablet TAKE 1 TABLET(40 MG) BY MOUTH AT BEDTIME 09/08/23   Berneta Elsie Sayre, MD      Family History  Problem Relation Age of Onset   High blood pressure Mother    Breast cancer Mother    Heart disease Father    High blood pressure Sister    Early death Brother     Social History   Socioeconomic History   Marital status: Divorced    Spouse name: Not on file   Number of children: Not on file   Years of education: Not on file   Highest education level: 12th grade  Occupational History   Occupation: retired  Tobacco Use   Smoking status: Former    Current packs/day: 0.00    Types: Cigarettes    Quit date: 2008    Years since quitting: 17.9   Smokeless tobacco: Former  Building Services Engineer status: Never Used  Substance and Sexual Activity   Alcohol use: Not Currently   Drug use: Never   Sexual activity: Not on file  Other Topics Concern   Not on file  Social History Narrative   Not on file   Social Drivers of Health   Financial Resource Strain: Low Risk  (12/25/2023)   Overall Financial Resource Strain (CARDIA)    Difficulty of Paying Living Expenses: Not hard at all  Food Insecurity: No Food Insecurity (12/25/2023)   Hunger Vital Sign    Worried About Running Out of Food in the Last Year: Never true    Ran Out of Food in the Last Year: Never true  Transportation Needs: No Transportation Needs (12/25/2023)   PRAPARE - Administrator, Civil Service (Medical): No    Lack of Transportation (Non-Medical): No  Physical Activity: Insufficiently Active (12/25/2023)   Exercise Vital Sign    Days of Exercise per Week: 1 day    Minutes of Exercise per Session: 10 min  Stress: Stress Concern Present (12/25/2023)   [employer] of Occupational Health - Occupational Stress Questionnaire    Feeling of Stress: To some extent  Social Connections: Moderately Integrated (12/25/2023)   Social Connection and Isolation Panel    Frequency of Communication with Friends and Family: More than three times a week    Frequency of Social Gatherings with Friends  and Family: More than three times a week    Attends Religious Services: More than 4 times per year    Active Member of [employer] or Organizations: Yes    Attends Banker Meetings: 1 to 4 times per year    Marital Status: Divorced     Vital Signs: There were no vitals taken for this visit.  Physical Exam   Left and right knee X-rays 07/28/23           Kellgren and Jerilynn III bilaterally      Labs:  CBC: Recent Labs    06/26/23 1125  WBC 11.2*  HGB 13.6  HCT 41.7  PLT 474.0*    COAGS: No results for input(s): INR, APTT in the last 8760 hours.  BMP: Recent Labs    06/26/23 1125 12/29/23 1050  NA 137 132*  K 3.8 4.2  CL 99 96  CO2 29 26  GLUCOSE 109* 111*  BUN 14 25*  CALCIUM  8.9 8.7  CREATININE 0.56 0.77    LIVER FUNCTION TESTS: Recent Labs    06/26/23 1125  BILITOT 0.5  AST 26  ALT 18  ALKPHOS 116  PROT 6.7  ALBUMIN 4.0    Assessment and Plan:  78 year old female with a history of bilateral knee osteoarthritis with chronic knee pain refractory to conservative measures.  She is adamantly against joint replacement surgery. She was considered an excellent candidate for geniculate artery embolization and her right knee was treated 01/30/24.   Ester Sides, MD Pager: [phone]    I spent a total of 25 Minutes in face to face in clinical consultation, greater than 50% of which was counseling/coordinating care for right knee pain.

## 2024-02-23 ENCOUNTER — Inpatient Hospital Stay
Admission: RE | Admit: 2024-02-23 | Discharge: 2024-02-23 | Disposition: A | Source: Ambulatory Visit | Attending: Interventional Radiology

## 2024-02-23 DIAGNOSIS — M1711 Unilateral primary osteoarthritis, right knee: Secondary | ICD-10-CM

## 2024-02-23 HISTORY — PX: IR RADIOLOGIST EVAL & MGMT: IMG5224

## 2024-02-26 ENCOUNTER — Telehealth: Payer: Self-pay

## 2024-02-26 ENCOUNTER — Other Ambulatory Visit: Payer: Self-pay | Admitting: Interventional Radiology

## 2024-02-26 DIAGNOSIS — M1712 Unilateral primary osteoarthritis, left knee: Secondary | ICD-10-CM

## 2024-02-26 MED ORDER — METHYLPREDNISOLONE 4 MG PO TBPK: ORAL_TABLET | ORAL | 0 refills | Status: AC

## 2024-02-26 NOTE — H&P (Signed)
 Chief Complaint: Patient was seen in consultation today for left knee pain.   Referring Physician(s): Dr. Joane  Supervising Physician: Jennefer Rover  Patient Status: [facility] - Outpatient   History of Present Illness: Lori Allison is a 78 y.o. female with a medical history significant for depression/anxiety, HTN, osteoporosis and bilateral knee osteoarthritis with pain and joint effusions. She attempted conservative management with steroid and hyaluronic injections. She was reluctant to consider knee replacements but was interested in exploring less invasive options.   She was referred to Interventional Radiology for geniculate artery embolization and first met with Dr. Jennefer 10/22/5. They discussed the procedure details, risks, benefits, alternatives and post-procedure expectations. She was agreeable to proceed and underwent right geniculate artery embolization 01/30/24. At her follow up appointment with Dr. Jennefer 02/23/24 she reported that her right knee felt great with minimal to no pain. She requested to have her left knee treated and she presents to the IR clinic today for a left geniculate artery embolization.   Past Medical History:  Diagnosis Date   Chicken pox    Hay fever    Hyperlipidemia    Hypertension    UTI (urinary tract infection)     Past Surgical History:  Procedure Laterality Date   EYE SURGERY Bilateral 07/17/2018   IR EMBO ARTERIAL NOT HEMORR HEMANG INC GUIDE ROADMAPPING  01/30/2024   IR RADIOLOGIST EVAL & MGMT  01/07/2024   IR RADIOLOGIST EVAL & MGMT  02/23/2024    Allergies: Carvedilol  phosphate er  Medications: Prior to Admission medications  Medication Sig Start Date End Date Taking? Authorizing Provider  alendronate  (FOSAMAX ) 70 MG tablet TAKE 1 TABLET(70 MG) BY MOUTH EVERY 7 DAYS WITH A FULL GLASS OF WATER AND ON AN EMPTY STOMACH 04/25/23   Berneta Elsie Sayre, MD  ALPRAZolam  (XANAX ) 0.5 MG tablet TAKE 1 TABLET(0.5 MG) BY MOUTH AT  BEDTIME AS NEEDED 02/05/24   Douglass Caul B, FNP  aspirin  EC 81 MG tablet Take 1 tablet (81 mg total) by mouth daily. 03/23/18   Berneta Elsie Sayre, MD  azelastine  (ASTELIN ) 0.1 % nasal spray Place 1 spray into both nostrils 2 (two) times daily. As needed for post nasal drip 12/11/21   Berneta Elsie Sayre, MD  Calcium  Carbonate-Vitamin D  (OSCAL 500/200 D-3 PO) Take 1 tablet by mouth daily. 03/18/18   [provider]  diclofenac  sodium (VOLTAREN ) 1 % GEL Apply 2 g topically 3 (three) times daily as needed. 04/27/18   Berneta Elsie Sayre, MD  escitalopram  (LEXAPRO ) 20 MG tablet TAKE 1 TABLET(20 MG) BY MOUTH DAILY 12/05/23   Berneta Elsie Sayre, MD  ezetimibe  (ZETIA ) 10 MG tablet TAKE 1 TABLET(10 MG) BY MOUTH DAILY 12/02/23   Berneta Elsie Sayre, MD  methylPREDNISolone  (MEDROL  DOSEPAK) 4 MG TBPK tablet Take as prescribed by pharmacy 01/30/24   Jennefer Rover PARAS, MD  metoprolol  tartrate (LOPRESSOR ) 50 MG tablet Take 1 tablet (50 mg total) by mouth 2 (two) times daily. 06/26/23   Berneta Elsie Sayre, MD  Multiple Vitamin (MULTIVITAMIN) tablet Take 1 tablet by mouth daily.    [provider]  omega-3 acid ethyl esters (LOVAZA) 1 g capsule Take by mouth daily.    [provider]  ondansetron  (ZOFRAN ) 4 MG tablet Take 1 tablet (4 mg total) by mouth every 8 (eight) hours as needed for nausea or vomiting. 06/06/23   Berneta Elsie Sayre, MD  simvastatin  (ZOCOR ) 40 MG tablet TAKE 1 TABLET(40 MG) BY MOUTH AT BEDTIME 09/08/23  Berneta Elsie Sayre, MD     Family History  Problem Relation Age of Onset   High blood pressure Mother    Breast cancer Mother    Heart disease Father    High blood pressure Sister    Early death Brother     Social History   Socioeconomic History   Marital status: Divorced    Spouse name: Not on file   Number of children: Not on file   Years of education: Not on file   Highest education level: 12th grade  Occupational History    Occupation: retired  Tobacco Use   Smoking status: Former    Current packs/day: 0.00    Types: Cigarettes    Quit date: 2008    Years since quitting: 17.9   Smokeless tobacco: Former  Building Services Engineer status: Never Used  Substance and Sexual Activity   Alcohol use: Not Currently   Drug use: Never   Sexual activity: Not on file  Other Topics Concern   Not on file  Social History Narrative   Not on file   Social Drivers of Health   Tobacco Use: Medium Risk (12/29/2023)   Patient History    Smoking Tobacco Use: Former    Smokeless Tobacco Use: Former    Passive Exposure: Not on Actuary Strain: Low Risk (12/25/2023)   Overall Financial Resource Strain (CARDIA)    Difficulty of Paying Living Expenses: Not hard at all  Food Insecurity: No Food Insecurity (12/25/2023)   Epic    Worried About Programme Researcher, Broadcasting/film/video in the Last Year: Never true    Ran Out of Food in the Last Year: Never true  Transportation Needs: No Transportation Needs (12/25/2023)   Epic    Lack of Transportation (Medical): No    Lack of Transportation (Non-Medical): No  Physical Activity: Insufficiently Active (12/25/2023)   Exercise Vital Sign    Days of Exercise per Week: 1 day    Minutes of Exercise per Session: 10 min  Stress: Stress Concern Present (12/25/2023)   [employer] of Occupational Health - Occupational Stress Questionnaire    Feeling of Stress: To some extent  Social Connections: Moderately Integrated (12/25/2023)   Social Connection and Isolation Panel    Frequency of Communication with Friends and Family: More than three times a week    Frequency of Social Gatherings with Friends and Family: More than three times a week    Attends Religious Services: More than 4 times per year    Active Member of Clubs or Organizations: Yes    Attends Banker Meetings: 1 to 4 times per year    Marital Status: Divorced  Depression (PHQ2-9): Low Risk (06/26/2023)   Depression  (PHQ2-9)    PHQ-2 Score: 4  Alcohol Screen: Low Risk (12/25/2023)   Alcohol Screen    Last Alcohol Screening Score (AUDIT): 1  Housing: Low Risk (12/25/2023)   Epic    Unable to Pay for Housing in the Last Year: No    Number of Times Moved in the Last Year: 0    Homeless in the Last Year: No  Utilities: Not At Risk (05/30/2023)   [employer] Utilities    Threatened with loss of utilities: No  Health Literacy: Adequate Health Literacy (05/30/2023)   B1300 Health Literacy    Frequency of need for help with medical instructions: Never    Review of Systems: A 12 point ROS discussed and pertinent positives are indicated in  the HPI above.  All other systems are negative.  Review of Systems  Vital Signs: There were no vitals taken for this visit.  Physical Exam  Labs:  CBC: Recent Labs    06/26/23 1125  WBC 11.2*  HGB 13.6  HCT 41.7  PLT 474.0*    COAGS: No results for input(s): INR, APTT in the last 8760 hours.  BMP: Recent Labs    06/26/23 1125 12/29/23 1050  NA 137 132*  K 3.8 4.2  CL 99 96  CO2 29 26  GLUCOSE 109* 111*  BUN 14 25*  CALCIUM  8.9 8.7  CREATININE 0.56 0.77    LIVER FUNCTION TESTS: Recent Labs    06/26/23 1125  BILITOT 0.5  AST 26  ALT 18  ALKPHOS 116  PROT 6.7  ALBUMIN 4.0    TUMOR MARKERS: No results for input(s): AFPTM, CEA, CA199, CHROMGRNA in the last 8760 hours.  Assessment and Plan:  Left knee pain: Candis CHANETA Pereyra, 78 year old female, presents today for an image-guided left geniculate artery embolization.   Risks and benefits of this procedure were discussed with the patient including, but not limited to bleeding, infection, vascular injury or contrast induced renal failure.  All of the patient's questions were answered, patient is agreeable to proceed. She has been NPO.   Consent signed and in chart.  Thank you for this interesting consult.  I greatly enjoyed meeting LORISSA KISHBAUGH and look forward to participating in their  care.  A copy of this report was sent to the requesting provider on this date.  Electronically Signed: Warren Dais, AGACNP-BC 02/26/2024, 10:25 AM   I spent a total of  30 Minutes   in face to face in clinical consultation, greater than 50% of which was counseling/coordinating care for left knee pain.

## 2024-02-26 NOTE — Discharge Instructions (Signed)

## 2024-02-26 NOTE — Telephone Encounter (Signed)
 See telephone note Medrol dose pack e-scribed

## 2024-02-27 ENCOUNTER — Ambulatory Visit
Admission: RE | Admit: 2024-02-27 | Discharge: 2024-02-27 | Disposition: A | Source: Ambulatory Visit | Attending: Interventional Radiology

## 2024-02-27 DIAGNOSIS — M1712 Unilateral primary osteoarthritis, left knee: Secondary | ICD-10-CM

## 2024-02-27 HISTORY — PX: IR EMBO ARTERIAL NOT HEMORR HEMANG INC GUIDE ROADMAPPING: IMG5448

## 2024-02-27 MED ORDER — DEXAMETHASONE SOD PHOSPHATE PF 10 MG/ML IJ SOLN
10.0000 mg | Freq: Once | INTRAMUSCULAR | Status: AC
  Administered 2024-02-27: 10 mg via INTRAVENOUS

## 2024-02-27 MED ORDER — MIDAZOLAM HCL (PF) 2 MG/2ML IJ SOLN
INTRAMUSCULAR | Status: AC | PRN
  Administered 2024-02-27: 1 mg via INTRAVENOUS

## 2024-02-27 MED ORDER — LIDOCAINE-EPINEPHRINE 1 %-1:100000 IJ SOLN
10.0000 mL | Freq: Once | INTRAMUSCULAR | Status: AC
  Administered 2024-02-27: 11:00:00 10 mL via INTRADERMAL

## 2024-02-27 MED ORDER — NITROGLYCERIN 1 MG/10 ML FOR IR/CATH LAB
100.0000 ug | INTRA_ARTERIAL | Status: DC | PRN
  Administered 2024-02-27 (×2): 100 ug via INTRA_ARTERIAL

## 2024-02-27 MED ORDER — IIOPAMIDOL (ISOVUE-250) INJECTION 51%
70.0000 mL | Freq: Once | INTRAVENOUS | Status: AC | PRN
  Administered 2024-02-27: 11:00:00 70 mL via INTRA_ARTERIAL

## 2024-02-27 MED ORDER — ACETAMINOPHEN 10 MG/ML IV SOLN
1000.0000 mg | Freq: Once | INTRAVENOUS | Status: AC
  Administered 2024-02-27: 11:00:00 1000 mg via INTRAVENOUS

## 2024-02-27 MED ORDER — KETOROLAC TROMETHAMINE 30 MG/ML IJ SOLN
30.0000 mg | Freq: Once | INTRAMUSCULAR | Status: AC
  Administered 2024-02-27: 11:00:00 30 mg via INTRAVENOUS

## 2024-02-27 MED ORDER — FENTANYL CITRATE (PF) 100 MCG/2ML IJ SOLN
INTRAMUSCULAR | Status: AC | PRN
  Administered 2024-02-27 (×2): 50 ug via INTRAVENOUS

## 2024-02-27 MED ORDER — MIDAZOLAM HCL (PF) 2 MG/2ML IJ SOLN: 1.0000 mg | INTRAMUSCULAR | Status: DC | PRN

## 2024-02-27 MED ORDER — SODIUM CHLORIDE 0.9 % IV SOLN: INTRAVENOUS | Status: DC

## 2024-02-27 MED ORDER — FENTANYL CITRATE (PF) 50 MCG/ML IJ SOSY: 25.0000 ug | PREFILLED_SYRINGE | INTRAMUSCULAR | Status: DC | PRN

## 2024-02-27 NOTE — Procedures (Signed)
 Interventional Radiology Procedure Note  Procedure:  Left geniculate artery embolization  Findings: Please refer to procedural dictation for full description. Left proximal superficial femoral artery 4 Fr access, manual compression for hemostasis.  Complications: None immediate  Estimated Blood Loss: <5 ml  Recommendations: IR will arrange 1 month outpatient follow up.   Ester Sides, MD

## 2024-03-01 ENCOUNTER — Telehealth: Payer: Self-pay

## 2024-03-01 NOTE — Telephone Encounter (Signed)
 See telephone note

## 2024-03-16 ENCOUNTER — Other Ambulatory Visit

## 2024-03-25 ENCOUNTER — Other Ambulatory Visit: Payer: Self-pay | Admitting: Interventional Radiology

## 2024-03-25 DIAGNOSIS — M1712 Unilateral primary osteoarthritis, left knee: Secondary | ICD-10-CM

## 2024-04-05 NOTE — Progress Notes (Shared)
 "   This encounter was conducted via the [employer] providing interactive audio and visual communication.  The patient provided verbal consent to conduct a virtual appointment.  The patient was located at their primary residence during this encounter.  Referring Physician(s): Dr. Joane  Chief Complaint: The patient is seen in virtual video follow up today s/p right GAE 01/30/24 and left GAE 02/27/24  History of present illness:  Lori Allison is a 79 y.o. female with a medical history significant for depression/anxiety, HTN, osteoporosis and bilateral knee osteoarthritis with pain and joint effusions. She attempted conservative management with steroid and hyaluronic injections. She was reluctant to consider knee replacements but was interested in exploring less invasive options.   She was referred to Interventional Radiology for geniculate artery embolization and we first met in consultation 01/07/24. We discussed the procedure details, risks, benefits, alternatives and post-procedure expectations. She was agreeable to proceed and underwent right geniculate artery embolization 01/30/24. At her follow up appointment 02/23/24 she reported that her right knee felt great with minimal to no pain. She requested to have her left knee treated and this was completed 02/27/24.   She presents today via virtual video visit for follow up.   She has unfortunately not responded as well with her left knee as she did her right.  She does endorse significantly improved range of motion and stiffness and going up and down stairs is much easier. Her average left knee pain over the past week is a 6/10.  Her right knee still bothers her some, but is improved from prior to the index procedure.  Past Medical History:  Diagnosis Date   Chicken pox    Hay fever    Hyperlipidemia    Hypertension    UTI (urinary tract infection)     Past Surgical History:  Procedure Laterality Date   EYE SURGERY Bilateral  07/17/2018   IR EMBO ARTERIAL NOT HEMORR HEMANG INC GUIDE ROADMAPPING  01/30/2024   IR EMBO ARTERIAL NOT HEMORR HEMANG INC GUIDE ROADMAPPING  02/27/2024   IR RADIOLOGIST EVAL & MGMT  01/07/2024   IR RADIOLOGIST EVAL & MGMT  02/23/2024    Allergies: Carvedilol  phosphate er  Medications: Prior to Admission medications  Medication Sig Start Date End Date Taking? Authorizing Provider  alendronate  (FOSAMAX ) 70 MG tablet TAKE 1 TABLET(70 MG) BY MOUTH EVERY 7 DAYS WITH A FULL GLASS OF WATER AND ON AN EMPTY STOMACH 04/25/23   Berneta Elsie Sayre, MD  ALPRAZolam  (XANAX ) 0.5 MG tablet TAKE 1 TABLET(0.5 MG) BY MOUTH AT BEDTIME AS NEEDED 02/05/24   Douglass Caul B, FNP  aspirin  EC 81 MG tablet Take 1 tablet (81 mg total) by mouth daily. 03/23/18   Berneta Elsie Sayre, MD  azelastine  (ASTELIN ) 0.1 % nasal spray Place 1 spray into both nostrils 2 (two) times daily. As needed for post nasal drip 12/11/21   Berneta Elsie Sayre, MD  Calcium  Carbonate-Vitamin D  (OSCAL 500/200 D-3 PO) Take 1 tablet by mouth daily. 03/18/18   [provider]  diclofenac  sodium (VOLTAREN ) 1 % GEL Apply 2 g topically 3 (three) times daily as needed. 04/27/18   Berneta Elsie Sayre, MD  escitalopram  (LEXAPRO ) 20 MG tablet TAKE 1 TABLET(20 MG) BY MOUTH DAILY 12/05/23   Berneta Elsie Sayre, MD  ezetimibe  (ZETIA ) 10 MG tablet TAKE 1 TABLET(10 MG) BY MOUTH DAILY 12/02/23   Berneta Elsie Sayre, MD  methylPREDNISolone  (MEDROL  DOSEPAK) 4 MG TBPK tablet Take as prescribed by pharmacy 01/30/24   Marely Apgar,  Ester PARAS, MD  methylPREDNISolone  (MEDROL  DOSEPAK) 4 MG TBPK tablet Please take as prescribed by pharmacy 02/27/24   Jennefer Ester PARAS, MD  metoprolol  tartrate (LOPRESSOR ) 50 MG tablet Take 1 tablet (50 mg total) by mouth 2 (two) times daily. 06/26/23   Berneta Elsie Sayre, MD  Multiple Vitamin (MULTIVITAMIN) tablet Take 1 tablet by mouth daily.    [provider]  omega-3 acid ethyl esters (LOVAZA) 1 g capsule Take by  mouth daily.    [provider]  ondansetron  (ZOFRAN ) 4 MG tablet Take 1 tablet (4 mg total) by mouth every 8 (eight) hours as needed for nausea or vomiting. 06/06/23   Berneta Elsie Sayre, MD  simvastatin  (ZOCOR ) 40 MG tablet TAKE 1 TABLET(40 MG) BY MOUTH AT BEDTIME 09/08/23   Berneta Elsie Sayre, MD     Family History  Problem Relation Age of Onset   High blood pressure Mother    Breast cancer Mother    Heart disease Father    High blood pressure Sister    Early death Brother     Social History   Socioeconomic History   Marital status: Divorced    Spouse name: Not on file   Number of children: Not on file   Years of education: Not on file   Highest education level: 12th grade  Occupational History   Occupation: retired  Tobacco Use   Smoking status: Former    Current packs/day: 0.00    Types: Cigarettes    Quit date: 2008    Years since quitting: 18.0   Smokeless tobacco: Former  Building Services Engineer status: Never Used  Substance and Sexual Activity   Alcohol use: Not Currently   Drug use: Never   Sexual activity: Not on file  Other Topics Concern   Not on file  Social History Narrative   Not on file   Social Drivers of Health   Tobacco Use: Medium Risk (12/29/2023)   Patient History    Smoking Tobacco Use: Former    Smokeless Tobacco Use: Former    Passive Exposure: Not on Actuary Strain: Low Risk (12/25/2023)   Overall Financial Resource Strain (CARDIA)    Difficulty of Paying Living Expenses: Not hard at all  Food Insecurity: No Food Insecurity (12/25/2023)   Epic    Worried About Programme Researcher, Broadcasting/film/video in the Last Year: Never true    Ran Out of Food in the Last Year: Never true  Transportation Needs: No Transportation Needs (12/25/2023)   Epic    Lack of Transportation (Medical): No    Lack of Transportation (Non-Medical): No  Physical Activity: Insufficiently Active (12/25/2023)   Exercise Vital Sign    Days of Exercise per  Week: 1 day    Minutes of Exercise per Session: 10 min  Stress: Stress Concern Present (12/25/2023)   [employer] of Occupational Health - Occupational Stress Questionnaire    Feeling of Stress: To some extent  Social Connections: Moderately Integrated (12/25/2023)   Social Connection and Isolation Panel    Frequency of Communication with Friends and Family: More than three times a week    Frequency of Social Gatherings with Friends and Family: More than three times a week    Attends Religious Services: More than 4 times per year    Active Member of [employer] or Organizations: Yes    Attends Banker Meetings: 1 to 4 times per year    Marital Status: Divorced  Depression (PHQ2-9):  Low Risk (06/26/2023)   Depression (PHQ2-9)    PHQ-2 Score: 4  Alcohol Screen: Low Risk (12/25/2023)   Alcohol Screen    Last Alcohol Screening Score (AUDIT): 1  Housing: Low Risk (12/25/2023)   Epic    Unable to Pay for Housing in the Last Year: No    Number of Times Moved in the Last Year: 0    Homeless in the Last Year: No  Utilities: Not At Risk (05/30/2023)   [employer] Utilities    Threatened with loss of utilities: No  Health Literacy: Adequate Health Literacy (05/30/2023)   B1300 Health Literacy    Frequency of need for help with medical instructions: Never     Vital Signs: There were no vitals taken for this visit.   Physical Exam: Patient is alert, oriented and able to participate fully in the conversation. No apparent discomfort or distress observed. She appears appropriately dressed.    Imaging:  Left and right knee X-rays 07/28/23           Kellgren and Jerilynn III bilaterally    Left GAE 02/27/24    Labs:  CBC: Recent Labs    06/26/23 1125  WBC 11.2*  HGB 13.6  HCT 41.7  PLT 474.0*    COAGS: No results for input(s): INR, APTT in the last 8760 hours.  BMP: Recent Labs    06/26/23 1125 12/29/23 1050  NA 137 132*  K 3.8 4.2  CL 99 96  CO2 29 26   GLUCOSE 109* 111*  BUN 14 25*  CALCIUM  8.9 8.7  CREATININE 0.56 0.77    LIVER FUNCTION TESTS: Recent Labs    06/26/23 1125  BILITOT 0.5  AST 26  ALT 18  ALKPHOS 116  PROT 6.7  ALBUMIN 4.0    Assessment and Plan: 79 year old female with a history of bilateral knee osteoarthritis with chronic knee pain refractory to conservative measures.  She was adamantly against joint replacement surgery and was considered an excellent candidate for geniculate artery embolization. Her right knee was treated 01/30/24 and she responded well with elimination of her right knee pain. She requested to have the left knee treated and this was performed 02/27/24.  Her left knee range of motion and stiffness is significantly improved, however she is still experiencing some moderate pain.   She is interested in starting physical therapy and doing more exercising to help improve her pain, as she still hopes to avoid knee replacement.  Follow up with IR as needed.   Ester Sides, MD Pager: [phone]    I spent a total of 25 Minutes in virtual video clinical consultation, greater than 50% of which was counseling/coordinating care for bilateral knee pain.      "

## 2024-04-07 ENCOUNTER — Inpatient Hospital Stay
Admission: RE | Admit: 2024-04-07 | Discharge: 2024-04-07 | Disposition: A | Source: Ambulatory Visit | Attending: Interventional Radiology

## 2024-04-07 DIAGNOSIS — M1712 Unilateral primary osteoarthritis, left knee: Secondary | ICD-10-CM

## 2024-04-07 HISTORY — PX: IR RADIOLOGIST EVAL & MGMT: IMG5224

## 2024-04-29 ENCOUNTER — Ambulatory Visit: Admitting: Family Medicine

## 2024-05-31 ENCOUNTER — Ambulatory Visit

## 2024-06-28 ENCOUNTER — Ambulatory Visit: Admitting: Family Medicine
